# Patient Record
Sex: Male | Born: 1955 | Race: Black or African American | Hispanic: No | Marital: Single | Smoking: Current every day smoker
Health system: Southern US, Community
[De-identification: ages and names within clinical notes are randomized; demographics above are authoritative.]

## PROBLEM LIST (undated history)

## (undated) DIAGNOSIS — I10 Essential (primary) hypertension: Secondary | ICD-10-CM

## (undated) DIAGNOSIS — E785 Hyperlipidemia, unspecified: Secondary | ICD-10-CM

## (undated) DIAGNOSIS — F1010 Alcohol abuse, uncomplicated: Secondary | ICD-10-CM

## (undated) DIAGNOSIS — H547 Unspecified visual loss: Secondary | ICD-10-CM

## (undated) HISTORY — DX: Hyperlipidemia, unspecified: E78.5

---

## 1994-02-04 HISTORY — PX: TOOTH EXTRACTION: SUR596

## 2005-12-20 ENCOUNTER — Ambulatory Visit: Payer: Self-pay | Admitting: Family Medicine

## 2005-12-25 DIAGNOSIS — E785 Hyperlipidemia, unspecified: Secondary | ICD-10-CM | POA: Insufficient documentation

## 2005-12-25 DIAGNOSIS — M6530 Trigger finger, unspecified finger: Secondary | ICD-10-CM | POA: Insufficient documentation

## 2005-12-25 DIAGNOSIS — I10 Essential (primary) hypertension: Secondary | ICD-10-CM

## 2005-12-25 DIAGNOSIS — F17200 Nicotine dependence, unspecified, uncomplicated: Secondary | ICD-10-CM | POA: Insufficient documentation

## 2005-12-25 DIAGNOSIS — H548 Legal blindness, as defined in USA: Secondary | ICD-10-CM | POA: Insufficient documentation

## 2006-08-26 ENCOUNTER — Encounter (INDEPENDENT_AMBULATORY_CARE_PROVIDER_SITE_OTHER): Payer: Self-pay | Admitting: Family Medicine

## 2007-01-08 ENCOUNTER — Ambulatory Visit: Payer: Self-pay | Admitting: Family Medicine

## 2007-01-08 DIAGNOSIS — E669 Obesity, unspecified: Secondary | ICD-10-CM

## 2007-01-08 LAB — CONVERTED CEMR LAB
Cholesterol, target level: 200 mg/dL
LDL Goal: 130 mg/dL

## 2007-05-06 ENCOUNTER — Encounter (INDEPENDENT_AMBULATORY_CARE_PROVIDER_SITE_OTHER): Payer: Self-pay | Admitting: Family Medicine

## 2007-08-04 ENCOUNTER — Ambulatory Visit: Payer: Self-pay | Admitting: Family Medicine

## 2007-08-05 ENCOUNTER — Encounter (INDEPENDENT_AMBULATORY_CARE_PROVIDER_SITE_OTHER): Payer: Self-pay | Admitting: Family Medicine

## 2007-08-05 LAB — CONVERTED CEMR LAB
ALT: 35 units/L (ref 0–53)
AST: 33 units/L (ref 0–37)
Alkaline Phosphatase: 88 units/L (ref 39–117)
Basophils Absolute: 0.1 10*3/uL (ref 0.0–0.1)
Basophils Relative: 1 % (ref 0–1)
Chloride: 104 meq/L (ref 96–112)
Creatinine, Ser: 1.12 mg/dL (ref 0.40–1.50)
Eosinophils Absolute: 0.1 10*3/uL (ref 0.0–0.7)
Eosinophils Relative: 2 % (ref 0–5)
HDL: 54 mg/dL (ref >39)
Hemoglobin: 16.5 g/dL (ref 13.0–17.0)
LDL Cholesterol: 109 mg/dL — ABNORMAL HIGH (ref 0–99)
MCHC: 34.1 g/dL (ref 30.0–36.0)
MCV: 83.3 fL (ref 78.0–100.0)
Monocytes Absolute: 0.9 10*3/uL (ref 0.1–1.0)
Neutro Abs: 5.6 10*3/uL (ref 1.7–7.7)
PSA: 2.16 ng/mL (ref 0.10–4.00)
RDW: 17.1 % — ABNORMAL HIGH (ref 11.5–15.5)
TSH: 1.064 microintl units/mL (ref 0.350–5.50)
Total Bilirubin: 0.5 mg/dL (ref 0.3–1.2)
Total CHOL/HDL Ratio: 3.7
VLDL: 37 mg/dL (ref 0–40)

## 2007-08-06 ENCOUNTER — Telehealth (INDEPENDENT_AMBULATORY_CARE_PROVIDER_SITE_OTHER): Payer: Self-pay | Admitting: Family Medicine

## 2008-02-22 ENCOUNTER — Ambulatory Visit: Payer: Self-pay | Admitting: Family Medicine

## 2008-02-22 DIAGNOSIS — D759 Disease of blood and blood-forming organs, unspecified: Secondary | ICD-10-CM | POA: Insufficient documentation

## 2008-06-29 ENCOUNTER — Telehealth (INDEPENDENT_AMBULATORY_CARE_PROVIDER_SITE_OTHER): Payer: Self-pay | Admitting: *Deleted

## 2010-02-04 HISTORY — PX: ROTATOR CUFF REPAIR: SHX139

## 2010-10-19 ENCOUNTER — Other Ambulatory Visit: Payer: Self-pay | Admitting: Specialist

## 2010-10-19 ENCOUNTER — Ambulatory Visit (HOSPITAL_COMMUNITY)
Admission: RE | Admit: 2010-10-19 | Discharge: 2010-10-19 | Disposition: A | Discharge status: Home / Self Care | Payer: Worker's Compensation | Source: Ambulatory Visit | Attending: Specialist | Admitting: Specialist

## 2010-10-19 ENCOUNTER — Encounter (HOSPITAL_COMMUNITY): Payer: Worker's Compensation

## 2010-10-19 ENCOUNTER — Other Ambulatory Visit (HOSPITAL_COMMUNITY): Payer: Self-pay | Admitting: Specialist

## 2010-10-19 DIAGNOSIS — S43429A Sprain of unspecified rotator cuff capsule, initial encounter: Secondary | ICD-10-CM | POA: Insufficient documentation

## 2010-10-19 DIAGNOSIS — Z01818 Encounter for other preprocedural examination: Secondary | ICD-10-CM | POA: Insufficient documentation

## 2010-10-19 DIAGNOSIS — J3489 Other specified disorders of nose and nasal sinuses: Secondary | ICD-10-CM | POA: Insufficient documentation

## 2010-10-19 DIAGNOSIS — M75102 Unspecified rotator cuff tear or rupture of left shoulder, not specified as traumatic: Secondary | ICD-10-CM

## 2010-10-19 DIAGNOSIS — Z01812 Encounter for preprocedural laboratory examination: Secondary | ICD-10-CM | POA: Insufficient documentation

## 2010-10-19 DIAGNOSIS — R059 Cough, unspecified: Secondary | ICD-10-CM | POA: Insufficient documentation

## 2010-10-19 DIAGNOSIS — X58XXXA Exposure to other specified factors, initial encounter: Secondary | ICD-10-CM | POA: Insufficient documentation

## 2010-10-19 DIAGNOSIS — R05 Cough: Secondary | ICD-10-CM | POA: Insufficient documentation

## 2010-10-19 LAB — BASIC METABOLIC PANEL
Calcium: 10.4 mg/dL (ref 8.4–10.5)
GFR calc Af Amer: >60 mL/min (ref >60)
GFR calc non Af Amer: >60 mL/min (ref >60)
Potassium: 3.4 mEq/L — ABNORMAL LOW (ref 3.5–5.1)
Sodium: 138 mEq/L (ref 135–145)

## 2010-10-19 LAB — CBC
MCH: 27.7 pg (ref 26.0–34.0)
MCHC: 33.8 g/dL (ref 30.0–36.0)
RDW: 15.8 % — ABNORMAL HIGH (ref 11.5–15.5)

## 2010-10-19 LAB — SURGICAL PCR SCREEN: Staphylococcus aureus: NEGATIVE

## 2010-10-25 ENCOUNTER — Ambulatory Visit (HOSPITAL_COMMUNITY)
Admission: RE | Admit: 2010-10-25 | Discharge: 2010-10-26 | Disposition: A | Discharge status: Home / Self Care | Payer: Worker's Compensation | Source: Ambulatory Visit | Attending: Specialist | Admitting: Specialist

## 2010-10-25 DIAGNOSIS — F17200 Nicotine dependence, unspecified, uncomplicated: Secondary | ICD-10-CM | POA: Insufficient documentation

## 2010-10-25 DIAGNOSIS — Z01818 Encounter for other preprocedural examination: Secondary | ICD-10-CM | POA: Insufficient documentation

## 2010-10-25 DIAGNOSIS — M67919 Unspecified disorder of synovium and tendon, unspecified shoulder: Secondary | ICD-10-CM | POA: Insufficient documentation

## 2010-10-25 DIAGNOSIS — Z01812 Encounter for preprocedural laboratory examination: Secondary | ICD-10-CM | POA: Insufficient documentation

## 2010-10-25 DIAGNOSIS — H539 Unspecified visual disturbance: Secondary | ICD-10-CM | POA: Insufficient documentation

## 2010-10-25 DIAGNOSIS — M19019 Primary osteoarthritis, unspecified shoulder: Secondary | ICD-10-CM | POA: Insufficient documentation

## 2010-10-25 DIAGNOSIS — M719 Bursopathy, unspecified: Secondary | ICD-10-CM | POA: Insufficient documentation

## 2010-10-25 DIAGNOSIS — I10 Essential (primary) hypertension: Secondary | ICD-10-CM | POA: Insufficient documentation

## 2010-10-25 DIAGNOSIS — M25819 Other specified joint disorders, unspecified shoulder: Secondary | ICD-10-CM | POA: Insufficient documentation

## 2010-10-26 NOTE — Op Note (Signed)
[name], [name] NO.:  [id number]  MEDICAL RECORD NO.:  [id number]  LOCATION:  1620                         FACILITY:  [facility]  PHYSICIAN:  Jene Every, M.D.    DATE OF BIRTH:  01-05-1956  DATE OF PROCEDURE: DATE OF DISCHARGE:                              OPERATIVE REPORT   PREOPERATIVE DIAGNOSES:  Rotator cuff tear and acromioclavicular arthrosis, left shoulder; impingement syndrome.  POSTOPERATIVE DIAGNOSES:  Rotator cuff tear and acromioclavicular arthrosis, left shoulder; impingement syndrome.  PROCEDURE PERFORMED: 1. Mini open rotator cuff repair utilizing Mitek suture anchor. 2. Distal clavicle resection. 3. Subacromial decompression and acromioplasty.  ANESTHESIA:  General.  ASSISTANT:  Roma Schanz, P.A.  HISTORY:  55 year old with full-thickness rotator cuff tears, supraspinatus and symptomatic AC arthrosis, indicated for decompression, repair, and clavicle resection. It was fairly hypertrophic nonmobile distal clavicle with severe erosion of the acromiohumeral space and felt it was prudent to perform an open distal clavicle resection and felt repairing the rotator cuff appropriately with that approach as well. Risks and benefits discussed including infection, suboptimal range of motion, DVT, PE, anesthetic complications, etc.  TECHNIQUE:  The patient in supine beach-chair position after induction of adequate general anesthesia and 2 g Kefzol, left shoulder and upper extremity were prepped and draped in usual sterile fashion after range of motion was performed in the adequate range.  A small incision over the anterolateral aspect of the acromion was then made.  Subcutaneous tissue was dissected.  Electrocautery was utilized to achieve hemostasis.  The anterolateral corner was identified and divided the raphe, approximately 1.5 cm from the anterolateral aspect of the acromion.  I made a small incision in the raphe and  subperiosteally elevated the anterolateral and the anteromedial aspect of the deltoid preserving the attachment anteriorly releasing the CA ligament.  I removed a spur off the anterolateral aspect of the acromion with a 3 mm Kerrison.  We digitally lysed adhesions in the subacromial space. Hypertrophic bursa was noted and this was excised.  Full inspection of the rotator cuff revealed a full-thickness tear of the supraspinatus consistent with what obtained on the MRI, with hyperemia and tearing.  I made a small incision there completing near the vicinity of the tear, it was only 1.5 cm in length.  We debrided the edges, good bleeding tissue was longitudinal.  I decorticated the bone just beneath that with an AO elevator and irrigated it.  This was lateral to the bicipital groove, which was palpated.  This was in the supraspinatus and mid footprint of the supraspinatus.  I placed a Mitek suture anchor deep into the trough, tapped in and threaded the sutures through the tear of the supraspinatus, reapproximated and perform a good surgical knot.  No need for a double row technique.  Fully approximated the tendon, remaining of the tendon was unremarkable.  Following this and acromioplasty, we then turned our attention to the distal clavicle.  I then enlarged the incision slightly medial.  Due to the hypertrophic nature and mobility, I incised the capsule, which was hypertrophic and skeletonized the distal clavicle protecting the soft tissues anteriorly and posteriorly with a __________ was placed in the [facility] joint beneath the  clavicle to protect the rotator cuff.  Using an oscillating saw, removed 1 cm of the distal clavicle posteriorly with a fair amount of osteophytic ridging and the inferior osteophytes with good portion of that procedure decompressing the posterior aspect of the distal clavicle utilizing a 3 mm Kerrison undercutting the osteophytes, which were protected inferiorly.  It was  somewhat difficult to visualize; however, with meticulous approach and exposure, I removed this pathologic portion. Following this, we copiously irrigated the [facility] joint, inspected the capsular tear, tested the mobility and the stability of the distal clavicle, which was intact.  Coracoclavicular ligaments were intact. Next, copiously irrigated the wound.  We repaired the capsule with 0 Vicryl interrupted figure-of-eight sutures with watertight closure with double trapezial fashion with 2-0 Vicryl simple sutures and subcu with 2- 0 Vicryl simple sutures.  The raphe was repaired with #1 Vicryl interrupted figure-of-eight sutures overtop and through the acromion, copiously irrigated the wounds, subcu with 2-0 Vicryl simple sutures, and skin was reapproximated with 4-0 subcuticular Prolene.  Wound reinforced with Steri-Strips, sterile dressing applied, placed in a sling and abduction pillow, and extubated without difficulty and transported to the recovery room in satisfactory condition.  We used 4% Marcaine with epinephrine to infiltrate the subacromial region for a local block.  The patient tolerated the procedure well.  No complications.  Minimal blood loss.     Jene Every, M.D.     Cordelia Pen  D:  10/25/2010  T:  10/25/2010  Job:  [id number]  Electronically Signed by Jene Every M.D. on 10/26/2010 03:11:19 PM

## 2011-07-05 ENCOUNTER — Encounter (INDEPENDENT_AMBULATORY_CARE_PROVIDER_SITE_OTHER): Payer: Self-pay | Admitting: Ophthalmology

## 2011-07-12 ENCOUNTER — Encounter (INDEPENDENT_AMBULATORY_CARE_PROVIDER_SITE_OTHER): Payer: Self-pay | Admitting: Ophthalmology

## 2012-05-18 DIAGNOSIS — I10 Essential (primary) hypertension: Secondary | ICD-10-CM | POA: Diagnosis not present

## 2012-05-18 DIAGNOSIS — E785 Hyperlipidemia, unspecified: Secondary | ICD-10-CM | POA: Diagnosis not present

## 2012-05-25 ENCOUNTER — Other Ambulatory Visit (HOSPITAL_COMMUNITY): Payer: Self-pay | Admitting: Family Medicine

## 2012-05-25 DIAGNOSIS — E041 Nontoxic single thyroid nodule: Secondary | ICD-10-CM

## 2012-06-01 ENCOUNTER — Ambulatory Visit (HOSPITAL_COMMUNITY): Payer: Medicare Other

## 2012-07-06 ENCOUNTER — Ambulatory Visit (HOSPITAL_COMMUNITY)
Admission: RE | Admit: 2012-07-06 | Discharge: 2012-07-06 | Disposition: A | Discharge status: Home / Self Care | Payer: Medicare Other | Source: Ambulatory Visit | Attending: Family Medicine | Admitting: Family Medicine

## 2012-07-06 DIAGNOSIS — E049 Nontoxic goiter, unspecified: Secondary | ICD-10-CM | POA: Diagnosis not present

## 2012-07-06 DIAGNOSIS — E0789 Other specified disorders of thyroid: Secondary | ICD-10-CM | POA: Diagnosis not present

## 2012-07-06 DIAGNOSIS — E041 Nontoxic single thyroid nodule: Secondary | ICD-10-CM

## 2012-07-06 DIAGNOSIS — E042 Nontoxic multinodular goiter: Secondary | ICD-10-CM | POA: Diagnosis not present

## 2012-07-06 DIAGNOSIS — I10 Essential (primary) hypertension: Secondary | ICD-10-CM | POA: Diagnosis not present

## 2012-07-23 ENCOUNTER — Telehealth: Payer: Self-pay

## 2012-07-23 NOTE — Telephone Encounter (Signed)
Spoke to pt and he will call back to schedule colonoscopy.

## 2012-07-31 NOTE — Telephone Encounter (Signed)
LMOM to call.

## 2012-08-03 ENCOUNTER — Telehealth: Payer: Self-pay

## 2012-08-05 NOTE — Telephone Encounter (Signed)
Gastroenterology Pre-Procedure Form   Pt said he drinks a couple of beers maybe about every other week end   Request Date: 08/03/2012   , Requesting Physician: Dr. Mirna Mires     PATIENT INFORMATION:  [name] is a 57 y.o., male (DOB=02/12/1955).  PROCEDURE: Procedure(s) requested: colonoscopy Procedure Reason: screening for colon cancer  PATIENT REVIEW QUESTIONS: The patient reports the following:    1. Diabetes Melitis: no 2. Joint replacements in the past 12 months: no 3. Major health problems in the past 3 months: no 4. Has an artificial valve or MVP:no 5. Has been advised in past to take antibiotics in advance of a procedure like teeth cleaning: no}    MEDICATIONS & ALLERGIES:    Patient reports the following regarding taking any blood thinners:   Plavix? no Aspirin? YES Coumadin?  no  Patient confirms/reports the following medications:  Current Outpatient Prescriptions  Medication Sig Dispense Refill  . amLODipine (NORVASC) 10 MG tablet Take 10 mg by mouth daily.      Marland Kitchen aspirin 81 MG tablet Take 81 mg by mouth daily.      Marland Kitchen HYDROcodone-acetaminophen (NORCO/VICODIN) 5-325 MG per tablet Take 1 tablet by mouth every 6 (six) hours as needed for pain. Only as needed      . valsartan-hydrochlorothiazide (DIOVAN-HCT) 160-25 MG per tablet Take 1 tablet by mouth daily.       No current facility-administered medications for this visit.    Patient confirms/reports the following allergies:  Allergies  Allergen Reactions  . Cortisone     CORTISONE INJECTION MADE HIM FEEL LIKE SOMETHING WAS CRAWLING ALL OVER HIM.     Patient is appropriate to schedule for requested procedure(s): yes  AUTHORIZATION INFORMATION Primary Insurance: ID #: Group #:  Pre-Cert / Auth required: Pre-Cert / Auth #:   Secondary Insurance:   ID #:  Group #:  Pre-Cert / Auth required: Pre-Cert / Auth #:   No orders of the defined types were placed in this encounter.    SCHEDULE  INFORMATION: Procedure has been scheduled as follows:  Date:               Time:   Location:   This Gastroenterology Pre-Precedure Form is being routed to the following provider(s) for review: Jonette Eva, MD   Pt waiting for his schedule to make the appt.

## 2012-08-05 NOTE — Telephone Encounter (Signed)
MOVI PREP SPLIT DOSING, CLEAR LIQUIDS with breakfast.

## 2012-08-06 NOTE — Telephone Encounter (Signed)
Pt to call with a schedule.

## 2012-10-26 DIAGNOSIS — Z23 Encounter for immunization: Secondary | ICD-10-CM | POA: Diagnosis not present

## 2012-11-17 DIAGNOSIS — H3552 Pigmentary retinal dystrophy: Secondary | ICD-10-CM | POA: Diagnosis not present

## 2012-11-17 DIAGNOSIS — H2510 Age-related nuclear cataract, unspecified eye: Secondary | ICD-10-CM | POA: Diagnosis not present

## 2013-03-27 DIAGNOSIS — E041 Nontoxic single thyroid nodule: Secondary | ICD-10-CM | POA: Diagnosis not present

## 2013-03-27 DIAGNOSIS — I10 Essential (primary) hypertension: Secondary | ICD-10-CM | POA: Diagnosis not present

## 2013-03-27 DIAGNOSIS — E0789 Other specified disorders of thyroid: Secondary | ICD-10-CM | POA: Diagnosis not present

## 2013-11-22 DIAGNOSIS — Z23 Encounter for immunization: Secondary | ICD-10-CM | POA: Diagnosis not present

## 2013-12-04 DIAGNOSIS — H540 Blindness, both eyes: Secondary | ICD-10-CM | POA: Diagnosis not present

## 2013-12-04 DIAGNOSIS — Z125 Encounter for screening for malignant neoplasm of prostate: Secondary | ICD-10-CM | POA: Diagnosis not present

## 2013-12-04 DIAGNOSIS — I10 Essential (primary) hypertension: Secondary | ICD-10-CM | POA: Diagnosis not present

## 2013-12-04 DIAGNOSIS — R634 Abnormal weight loss: Secondary | ICD-10-CM | POA: Diagnosis not present

## 2013-12-04 DIAGNOSIS — E785 Hyperlipidemia, unspecified: Secondary | ICD-10-CM | POA: Diagnosis not present

## 2013-12-10 ENCOUNTER — Other Ambulatory Visit (HOSPITAL_COMMUNITY): Payer: Self-pay | Admitting: Family Medicine

## 2013-12-10 ENCOUNTER — Ambulatory Visit (HOSPITAL_COMMUNITY)
Admission: RE | Admit: 2013-12-10 | Discharge: 2013-12-10 | Disposition: A | Discharge status: Home / Self Care | Payer: Medicare Other | Source: Ambulatory Visit | Attending: Family Medicine | Admitting: Family Medicine

## 2013-12-10 DIAGNOSIS — Z87891 Personal history of nicotine dependence: Secondary | ICD-10-CM | POA: Diagnosis not present

## 2013-12-10 DIAGNOSIS — F17200 Nicotine dependence, unspecified, uncomplicated: Secondary | ICD-10-CM

## 2013-12-10 DIAGNOSIS — R918 Other nonspecific abnormal finding of lung field: Secondary | ICD-10-CM | POA: Diagnosis not present

## 2014-01-15 DIAGNOSIS — I10 Essential (primary) hypertension: Secondary | ICD-10-CM | POA: Diagnosis not present

## 2014-01-15 DIAGNOSIS — R634 Abnormal weight loss: Secondary | ICD-10-CM | POA: Diagnosis not present

## 2014-06-25 DIAGNOSIS — I10 Essential (primary) hypertension: Secondary | ICD-10-CM | POA: Diagnosis not present

## 2014-06-25 DIAGNOSIS — E785 Hyperlipidemia, unspecified: Secondary | ICD-10-CM | POA: Diagnosis not present

## 2014-07-05 DIAGNOSIS — H3552 Pigmentary retinal dystrophy: Secondary | ICD-10-CM | POA: Diagnosis not present

## 2014-08-18 DIAGNOSIS — H3552 Pigmentary retinal dystrophy: Secondary | ICD-10-CM | POA: Diagnosis not present

## 2014-08-18 DIAGNOSIS — H25811 Combined forms of age-related cataract, right eye: Secondary | ICD-10-CM | POA: Diagnosis not present

## 2014-08-18 DIAGNOSIS — H25813 Combined forms of age-related cataract, bilateral: Secondary | ICD-10-CM | POA: Diagnosis not present

## 2014-08-18 DIAGNOSIS — H5213 Myopia, bilateral: Secondary | ICD-10-CM | POA: Diagnosis not present

## 2014-09-20 NOTE — Patient Instructions (Addendum)
[name]  09/20/2014     @PREFPERIOPPHARMACY @   Your procedure is scheduled on 09/26/2014  Report to [facility] at 7:00 A.M.  Call this number if you have problems the morning of surgery:  [phone]   Remember:  Do not eat food or drink liquids after midnight.  Take these medicines the morning of surgery with A SIP OF WATER Amlodipine, Hydrocodone and Diovan   Do not wear jewelry, make-up or nail polish.  Do not wear lotions, powders, or perfumes.  You may wear deodorant.  Do not shave 48 hours prior to surgery.  Men may shave face and neck.  Do not bring valuables to the hospital.  [facility] is not responsible for any belongings or valuables.  Contacts, dentures or bridgework may not be worn into surgery.  Leave your suitcase in the car.  After surgery it may be brought to your room.  For patients admitted to the hospital, discharge time will be determined by your treatment team.  Patients discharged the day of surgery will not be allowed to drive home.   Please read over the following fact sheets that you were given. Anesthesia Post-op Instructions     PATIENT INSTRUCTIONS POST-ANESTHESIA  IMMEDIATELY FOLLOWING SURGERY:  Do not drive or operate machinery for the first twenty four hours after surgery.  Do not make any important decisions for twenty four hours after surgery or while taking narcotic pain medications or sedatives.  If you develop intractable nausea and vomiting or a severe headache please notify your doctor immediately.  FOLLOW-UP:  Please make an appointment with your surgeon as instructed. You do not need to follow up with anesthesia unless specifically instructed to do so.  WOUND CARE INSTRUCTIONS (if applicable):  Keep a dry clean dressing on the anesthesia/puncture wound site if there is drainage.  Once the wound has quit draining you may leave it open to air.  Generally you should leave the bandage intact for twenty four hours unless there is  drainage.  If the epidural site drains for more than 36-48 hours please call the anesthesia department.  QUESTIONS?:  Please feel free to call your physician or the hospital operator if you have any questions, and they will be happy to assist you.      Cataract Surgery  A cataract is a clouding of the lens of the eye. When a lens becomes cloudy, vision is reduced based on the degree and nature of the clouding. Surgery may be needed to improve vision. Surgery removes the cloudy lens and usually replaces it with a substitute lens (intraocular lens, IOL). LET YOUR EYE DOCTOR KNOW ABOUT:  Allergies to food or medicine.  Medicines taken including herbs, eye drops, over-the-counter medicines, and creams.  Use of steroids (by mouth or creams).  Previous problems with anesthetics or numbing medicine.  History of bleeding problems or blood clots.  Previous surgery.  Other health problems, including diabetes and kidney problems.  Possibility of pregnancy, if this applies. RISKS AND COMPLICATIONS  Infection.  Inflammation of the eyeball (endophthalmitis) that can spread to both eyes (sympathetic ophthalmia).  Poor wound healing.  If an IOL is inserted, it can later fall out of proper position. This is very uncommon.  Clouding of the part of your eye that holds an IOL in place. This is called an "after-cataract." These are uncommon but easily treated. BEFORE THE PROCEDURE  Do not eat or drink anything except small amounts of water for 8 to 12  before your surgery, or as directed by your caregiver.  Unless you are told otherwise, continue any eye drops you have been prescribed.  Talk to your primary caregiver about all other medicines that you take (both prescription and nonprescription). In some cases, you may need to stop or change medicines near the time of your surgery. This is most important if you are taking blood-thinning medicine.Do not stop medicines unless you are told to do  so.  Arrange for someone to drive you to and from the procedure.  Do not put contact lenses in either eye on the day of your surgery. PROCEDURE There is more than one method for safely removing a cataract. Your doctor can explain the differences and help determine which is best for you. Phacoemulsification surgery is the most common form of cataract surgery.  An injection is given behind the eye or eye drops are given to make this a painless procedure.  A small cut (incision) is made on the edge of the clear, dome-shaped surface that covers the front of the eye (cornea).  A tiny probe is painlessly inserted into the eye. This device gives off ultrasound waves that soften and break up the cloudy center of the lens. This makes it easier for the cloudy lens to be removed by suction.  An IOL may be implanted.  The normal lens of the eye is covered by a clear capsule. Part of that capsule is intentionally left in the eye to support the IOL.  Your surgeon may or may not use stitches to close the incision. There are other forms of cataract surgery that require a larger incision and stitches to close the eye. This approach is taken in cases where the doctor feels that the cataract cannot be easily removed using phacoemulsification. AFTER THE PROCEDURE  When an IOL is implanted, it does not need care. It becomes a permanent part of your eye and cannot be seen or felt.  Your doctor will schedule follow-up exams to check on your progress.  Review your other medicines with your doctor to see which can be resumed after surgery.  Use eye drops or take medicine as prescribed by your doctor. Document Released: 01/10/2011 Document Revised: 06/07/2013 Document Reviewed: 01/10/2011 [employer] Patient Information 2015 [place], [place]. This information is not intended to replace advice given to you by your health care provider. Make sure you discuss any questions you have with your health care provider.

## 2014-09-21 ENCOUNTER — Encounter (HOSPITAL_COMMUNITY)
Admission: RE | Admit: 2014-09-21 | Discharge: 2014-09-21 | Disposition: A | Discharge status: Home / Self Care | Payer: Medicare Other | Source: Ambulatory Visit | Attending: Ophthalmology | Admitting: Ophthalmology

## 2014-09-21 ENCOUNTER — Encounter (HOSPITAL_COMMUNITY): Payer: Self-pay

## 2014-09-21 ENCOUNTER — Other Ambulatory Visit: Payer: Self-pay

## 2014-09-21 DIAGNOSIS — Z01818 Encounter for other preprocedural examination: Secondary | ICD-10-CM | POA: Diagnosis not present

## 2014-09-21 DIAGNOSIS — H2511 Age-related nuclear cataract, right eye: Secondary | ICD-10-CM | POA: Insufficient documentation

## 2014-09-21 HISTORY — DX: Essential (primary) hypertension: I10

## 2014-09-21 LAB — BASIC METABOLIC PANEL
ANION GAP: 7 (ref 5–15)
BUN: 15 mg/dL (ref 6–20)
CHLORIDE: 103 mmol/L (ref 101–111)
CO2: 29 mmol/L (ref 22–32)
Calcium: 9.1 mg/dL (ref 8.9–10.3)
Creatinine, Ser: 1.19 mg/dL (ref 0.61–1.24)
GFR calc Af Amer: >60 mL/min (ref >60)
GFR calc non Af Amer: >60 mL/min (ref >60)
GLUCOSE: 102 mg/dL — AB (ref 65–99)
POTASSIUM: 3.7 mmol/L (ref 3.5–5.1)
Sodium: 139 mmol/L (ref 135–145)

## 2014-09-21 LAB — CBC
HEMATOCRIT: 43.9 % (ref 39.0–52.0)
HEMOGLOBIN: 14.7 g/dL (ref 13.0–17.0)
MCH: 28.3 pg (ref 26.0–34.0)
MCHC: 33.5 g/dL (ref 30.0–36.0)
MCV: 84.6 fL (ref 78.0–100.0)
Platelets: 483 10*3/uL — ABNORMAL HIGH (ref 150–400)
RBC: 5.19 MIL/uL (ref 4.22–5.81)
RDW: 15.5 % (ref 11.5–15.5)
WBC: 7.2 10*3/uL (ref 4.0–10.5)

## 2014-09-23 MED ORDER — PHENYLEPHRINE HCL 2.5 % OP SOLN
OPHTHALMIC | Status: AC
  Filled 2014-09-23: qty 15

## 2014-09-23 MED ORDER — LIDOCAINE HCL 3.5 % OP GEL
OPHTHALMIC | Status: AC
  Filled 2014-09-23: qty 1

## 2014-09-23 MED ORDER — LIDOCAINE HCL (PF) 1 % IJ SOLN
INTRAMUSCULAR | Status: AC
  Filled 2014-09-23: qty 2

## 2014-09-23 MED ORDER — TETRACAINE HCL 0.5 % OP SOLN
OPHTHALMIC | Status: AC
  Filled 2014-09-23: qty 2

## 2014-09-23 MED ORDER — NEOMYCIN-POLYMYXIN-DEXAMETH 3.5-10000-0.1 OP SUSP
OPHTHALMIC | Status: AC
  Filled 2014-09-23: qty 5

## 2014-09-23 MED ORDER — CYCLOPENTOLATE-PHENYLEPHRINE OP SOLN OPTIME - NO CHARGE
OPHTHALMIC | Status: AC
  Filled 2014-09-23: qty 2

## 2014-09-26 ENCOUNTER — Encounter (HOSPITAL_COMMUNITY): Payer: Self-pay | Admitting: *Deleted

## 2014-09-26 ENCOUNTER — Ambulatory Visit (HOSPITAL_COMMUNITY)
Admission: RE | Admit: 2014-09-26 | Discharge: 2014-09-26 | Disposition: A | Discharge status: Home / Self Care | Payer: Medicare Other | Source: Ambulatory Visit | Attending: Ophthalmology | Admitting: Ophthalmology

## 2014-09-26 ENCOUNTER — Ambulatory Visit (HOSPITAL_COMMUNITY): Payer: Medicare Other | Admitting: Anesthesiology

## 2014-09-26 ENCOUNTER — Encounter (HOSPITAL_COMMUNITY)
Admission: RE | Disposition: A | Discharge status: Home / Self Care | Payer: Self-pay | Source: Ambulatory Visit | Attending: Ophthalmology

## 2014-09-26 DIAGNOSIS — Z79899 Other long term (current) drug therapy: Secondary | ICD-10-CM | POA: Diagnosis not present

## 2014-09-26 DIAGNOSIS — H25811 Combined forms of age-related cataract, right eye: Secondary | ICD-10-CM | POA: Insufficient documentation

## 2014-09-26 DIAGNOSIS — F17200 Nicotine dependence, unspecified, uncomplicated: Secondary | ICD-10-CM | POA: Diagnosis not present

## 2014-09-26 DIAGNOSIS — H269 Unspecified cataract: Secondary | ICD-10-CM | POA: Diagnosis not present

## 2014-09-26 DIAGNOSIS — I10 Essential (primary) hypertension: Secondary | ICD-10-CM | POA: Diagnosis not present

## 2014-09-26 DIAGNOSIS — Z7982 Long term (current) use of aspirin: Secondary | ICD-10-CM | POA: Diagnosis not present

## 2014-09-26 DIAGNOSIS — H3552 Pigmentary retinal dystrophy: Secondary | ICD-10-CM | POA: Diagnosis not present

## 2014-09-26 HISTORY — PX: CATARACT EXTRACTION W/PHACO: SHX586

## 2014-09-26 SURGERY — PHACOEMULSIFICATION, CATARACT, WITH IOL INSERTION
Anesthesia: Monitor Anesthesia Care | Site: Eye | Laterality: Right

## 2014-09-26 MED ORDER — FENTANYL CITRATE (PF) 100 MCG/2ML IJ SOLN
25.0000 ug | INTRAMUSCULAR | Status: AC
  Administered 2014-09-26 (×2): 25 ug via INTRAVENOUS

## 2014-09-26 MED ORDER — PROVISC 10 MG/ML IO SOLN
INTRAOCULAR | Status: DC | PRN
  Administered 2014-09-26: 0.85 mL via INTRAOCULAR

## 2014-09-26 MED ORDER — LACTATED RINGERS IV SOLN
INTRAVENOUS | Status: DC
  Administered 2014-09-26: 1000 mL via INTRAVENOUS

## 2014-09-26 MED ORDER — MIDAZOLAM HCL 2 MG/2ML IJ SOLN
INTRAMUSCULAR | Status: AC
  Filled 2014-09-26: qty 2

## 2014-09-26 MED ORDER — LIDOCAINE HCL 3.5 % OP GEL
1.0000 "application " | Freq: Once | OPHTHALMIC | Status: AC
  Administered 2014-09-26: 1 via OPHTHALMIC

## 2014-09-26 MED ORDER — LIDOCAINE 3.5 % OP GEL OPTIME - NO CHARGE
OPHTHALMIC | Status: DC | PRN
  Administered 2014-09-26: 1 [drp] via OPHTHALMIC

## 2014-09-26 MED ORDER — NEOMYCIN-POLYMYXIN-DEXAMETH 3.5-10000-0.1 OP SUSP
OPHTHALMIC | Status: DC | PRN
  Administered 2014-09-26: 2 [drp] via OPHTHALMIC

## 2014-09-26 MED ORDER — EPINEPHRINE HCL 1 MG/ML IJ SOLN
INTRAMUSCULAR | Status: AC
  Filled 2014-09-26: qty 1

## 2014-09-26 MED ORDER — LIDOCAINE HCL (PF) 1 % IJ SOLN
INTRAOCULAR | Status: DC | PRN
  Administered 2014-09-26: .6 mL via OPHTHALMIC

## 2014-09-26 MED ORDER — BSS IO SOLN
INTRAOCULAR | Status: DC | PRN
  Administered 2014-09-26: 15 mL

## 2014-09-26 MED ORDER — PHENYLEPHRINE HCL 2.5 % OP SOLN
1.0000 [drp] | OPHTHALMIC | Status: AC
  Administered 2014-09-26 (×3): 1 [drp] via OPHTHALMIC

## 2014-09-26 MED ORDER — FENTANYL CITRATE (PF) 100 MCG/2ML IJ SOLN
INTRAMUSCULAR | Status: AC
  Filled 2014-09-26: qty 2

## 2014-09-26 MED ORDER — TETRACAINE HCL 0.5 % OP SOLN
1.0000 [drp] | OPHTHALMIC | Status: AC
  Administered 2014-09-26 (×3): 1 [drp] via OPHTHALMIC

## 2014-09-26 MED ORDER — BSS IO SOLN
INTRAOCULAR | Status: DC | PRN
  Administered 2014-09-26: 500 mL

## 2014-09-26 MED ORDER — CYCLOPENTOLATE-PHENYLEPHRINE 0.2-1 % OP SOLN
1.0000 [drp] | OPHTHALMIC | Status: AC
  Administered 2014-09-26 (×3): 1 [drp] via OPHTHALMIC

## 2014-09-26 MED ORDER — POVIDONE-IODINE 5 % OP SOLN
OPHTHALMIC | Status: DC | PRN
  Administered 2014-09-26: 1 via OPHTHALMIC

## 2014-09-26 MED ORDER — MIDAZOLAM HCL 2 MG/2ML IJ SOLN
1.0000 mg | INTRAMUSCULAR | Status: DC | PRN
  Administered 2014-09-26 (×2): 2 mg via INTRAVENOUS

## 2014-09-26 SURGICAL SUPPLY — 11 items
CLOTH BEACON ORANGE TIMEOUT ST (SAFETY) ×3 IMPLANT
EYE SHIELD UNIVERSAL CLEAR (GAUZE/BANDAGES/DRESSINGS) ×3 IMPLANT
GLOVE BIOGEL PI IND STRL 6.5 (GLOVE) ×1 IMPLANT
GLOVE BIOGEL PI INDICATOR 6.5 (GLOVE) ×2
GLOVE EXAM NITRILE MD LF STRL (GLOVE) ×3 IMPLANT
PAD ARMBOARD 7.5X6 YLW CONV (MISCELLANEOUS) ×3 IMPLANT
SIGHTPATH CAT PROC W REG LENS (Ophthalmic Related) ×3 IMPLANT
SYRINGE LUER LOK 1CC (MISCELLANEOUS) ×3 IMPLANT
TAPE SURG TRANSPORE 1 IN (GAUZE/BANDAGES/DRESSINGS) ×1 IMPLANT
TAPE SURGICAL TRANSPORE 1 IN (GAUZE/BANDAGES/DRESSINGS) ×2
WATER STERILE IRR 250ML POUR (IV SOLUTION) ×3 IMPLANT

## 2014-09-26 NOTE — Op Note (Signed)
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  Assistants: None  Anesthesia: Topical with MAC  Indications: Painless, progressive loss of vision with compromise of daily activities.  Surgery: Cataract Extraction with Intraocular lens Implant Right Eye  Discription: The patient had dilating drops and viscous lidocaine placed into the Right eye in the pre-op holding area. After transfer to the operating room, a time out was performed. The patient was then prepped and draped. Beginning with a 42 degree blade a paracentesis port was made at the surgeon's 2 o'clock position. The anterior chamber was then filled with 1% non-preserved lidocaine. This was followed by filling the anterior chamber with Provisc.  A 2.65mm keratome blade was used to make a clear corneal incision at the temporal limbus.  A bent cystatome needle was used to start a continuous tear capsulotomy. There was increased lens mobility noted. The capsulorhexis was completed with Utrata forceps. Hydrodissection was performed with balanced salt solution on a Fine canula. The lens nucleus was then removed using the phacoemulsification handpiece. Residual cortex was removed with the I&A handpiece. The anterior chamber and capsular bag were refilled with Provisc. A posterior chamber intraocular lens was placed into the capsular bag with it's injector. The implant was positioned with the Kuglan hook. The Provisc was then removed from the anterior chamber and capsular bag with the I&A handpiece. Stromal hydration of the main incision and paracentesis port was performed with BSS on a Fine canula. The wounds were tested for leak which was negative. The patient tolerated the procedure well. There were no operative complications. The patient was then transferred to the recovery room in stable  condition.  Complications: None  Specimen: None  EBL: None  Prosthetic device: Hoya iSert 250, power 16.0 D, SN [id number].

## 2014-09-26 NOTE — Discharge Instructions (Signed)

## 2014-09-26 NOTE — Anesthesia Postprocedure Evaluation (Signed)
  Anesthesia Post-op Note  Patient: [name]  Procedure(s) Performed: Procedure(s): CATARACT EXTRACTION PHACO AND INTRAOCULAR LENS PLACEMENT RIGHT EYE CDE=18.56 (Right)  Patient Location: Short Stay  Anesthesia Type:MAC  Level of Consciousness: awake, alert , oriented and patient cooperative  Airway and Oxygen Therapy: Patient Spontanous Breathing  Post-op Pain: none  Post-op Assessment: Post-op Vital signs reviewed, Patient's Cardiovascular Status Stable, Respiratory Function Stable, Patent Airway, No signs of Nausea or vomiting and Pain level controlled              Post-op Vital Signs: Reviewed and stable  Last Vitals:  Filed Vitals:   09/26/14 0830  BP: 103/70  Pulse:   Temp:   Resp: 13    Complications: No apparent anesthesia complications

## 2014-09-26 NOTE — Anesthesia Preprocedure Evaluation (Signed)
Anesthesia Evaluation  Patient identified by MRN, date of birth, ID band Patient awake    Reviewed: Allergy & Precautions, NPO status , Patient's Chart, lab work & pertinent test results  Airway Mallampati: II  TM Distance: >3 FB     Dental  (+) Poor Dentition, Missing   Pulmonary Current Smoker,  breath sounds clear to auscultation        Cardiovascular hypertension, Pt. on medications Rhythm:Regular Rate:Normal     Neuro/Psych    GI/Hepatic negative GI ROS,   Endo/Other    Renal/GU      Musculoskeletal   Abdominal   Peds  Hematology   Anesthesia Other Findings   Reproductive/Obstetrics                             Anesthesia Physical Anesthesia Plan  ASA: II  Anesthesia Plan: MAC   Post-op Pain Management:    Induction: Intravenous  Airway Management Planned: Nasal Cannula  Additional Equipment:   Intra-op Plan:   Post-operative Plan:   Informed Consent: I have reviewed the patients History and Physical, chart, labs and discussed the procedure including the risks, benefits and alternatives for the proposed anesthesia with the patient or authorized representative who has indicated his/her understanding and acceptance.     Plan Discussed with:   Anesthesia Plan Comments:         Anesthesia Quick Evaluation

## 2014-09-26 NOTE — H&P (Signed)
I have reviewed the H&P, the patient was re-examined, and I have identified no interval changes in medical condition and plan of care since the history and physical of record  

## 2014-09-26 NOTE — Transfer of Care (Signed)
Immediate Anesthesia Transfer of Care Note  Patient: [name]  Procedure(s) Performed: Procedure(s): CATARACT EXTRACTION PHACO AND INTRAOCULAR LENS PLACEMENT RIGHT EYE CDE=18.56 (Right)  Patient Location: Short Stay  Anesthesia Type:MAC  Level of Consciousness: awake, alert , oriented and patient cooperative  Airway & Oxygen Therapy: Patient Spontanous Breathing  Post-op Assessment: Report given to RN and Post -op Vital signs reviewed and stable  Post vital signs: Reviewed and stable  Last Vitals:  Filed Vitals:   09/26/14 0830  BP: 103/70  Pulse:   Temp:   Resp: 13    Complications: No apparent anesthesia complications

## 2014-09-26 NOTE — Anesthesia Procedure Notes (Signed)
Procedure Name: MAC Date/Time: 09/26/2014 8:34 AM Performed by: Andree Elk, AMY A Pre-anesthesia Checklist: Patient identified, Timeout performed, Emergency Drugs available, Suction available and Patient being monitored Oxygen Delivery Method: Nasal cannula

## 2014-09-27 ENCOUNTER — Encounter (HOSPITAL_COMMUNITY): Payer: Self-pay | Admitting: Ophthalmology

## 2014-11-08 DIAGNOSIS — E039 Hypothyroidism, unspecified: Secondary | ICD-10-CM | POA: Diagnosis not present

## 2014-11-08 DIAGNOSIS — I10 Essential (primary) hypertension: Secondary | ICD-10-CM | POA: Diagnosis not present

## 2014-11-08 DIAGNOSIS — N182 Chronic kidney disease, stage 2 (mild): Secondary | ICD-10-CM | POA: Diagnosis not present

## 2014-12-09 ENCOUNTER — Ambulatory Visit: Payer: Self-pay | Admitting: "Endocrinology

## 2015-01-19 ENCOUNTER — Encounter: Payer: Self-pay | Admitting: "Endocrinology

## 2015-01-19 ENCOUNTER — Ambulatory Visit (INDEPENDENT_AMBULATORY_CARE_PROVIDER_SITE_OTHER): Payer: Medicare Other | Admitting: "Endocrinology

## 2015-01-19 VITALS — BP 115/76 | HR 72 | Ht 69.0 in | Wt 179.0 lb

## 2015-01-19 DIAGNOSIS — E042 Nontoxic multinodular goiter: Secondary | ICD-10-CM

## 2015-01-20 LAB — THYROID PEROXIDASE ANTIBODY

## 2015-01-20 LAB — TSH: TSH: 0.877 u[IU]/mL (ref 0.350–4.500)

## 2015-01-20 LAB — THYROGLOBULIN ANTIBODY

## 2015-01-20 LAB — T4, FREE: FREE T4: 0.82 ng/dL (ref 0.80–1.80)

## 2015-01-20 NOTE — Progress Notes (Signed)
Subjective:    Patient ID: [name], male    DOB: 1955-08-29, PCP Maggie Font, MD   Past Medical History  Diagnosis Date  . Hypertension   . Hyperlipidemia    Past Surgical History  Procedure Laterality Date  . Rotator cuff repair Left 2012  . Tooth extraction N/A 1996  . Cataract extraction w/phaco Right 09/26/2014    Procedure: CATARACT EXTRACTION PHACO AND INTRAOCULAR LENS PLACEMENT RIGHT EYE CDE=18.56;  Surgeon: Tonny Branch, MD;  Location: [facility];  Service: Ophthalmology;  Laterality: Right;   Social History   Social History  . Marital Status: Single    Spouse Name: N/A  . Number of Children: N/A  . Years of Education: N/A   Social History Main Topics  . Smoking status: Current Every Day Smoker -- 1.00 packs/day for 20 years    Types: Cigarettes  . Smokeless tobacco: Current User  . Alcohol Use: 0.6 oz/week    1 Glasses of wine per week  . Drug Use: No  . Sexual Activity: Not Asked   Other Topics Concern  . None   Social History Narrative   Outpatient Encounter Prescriptions as of 01/19/2015  Medication Sig  . amLODipine (NORVASC) 10 MG tablet Take 10 mg by mouth daily.  Marland Kitchen aspirin 81 MG tablet Take 81 mg by mouth daily.  Marland Kitchen atorvastatin (LIPITOR) 10 MG tablet Take 10 mg by mouth daily.  . valsartan-hydrochlorothiazide (DIOVAN-HCT) 160-25 MG per tablet Take 1 tablet by mouth daily.  Marland Kitchen HYDROcodone-acetaminophen (NORCO/VICODIN) 5-325 MG per tablet Take 1 tablet by mouth every 6 (six) hours as needed for pain. Only as needed  . pravastatin (PRAVACHOL) 20 MG tablet Take 20 mg by mouth daily.   No facility-administered encounter medications on file as of 01/19/2015.   ALLERGIES: Allergies  Allergen Reactions  . Cortisone     CORTISONE INJECTION MADE HIM FEEL LIKE SOMETHING WAS CRAWLING ALL OVER HIM.    VACCINATION STATUS: Immunization History  Administered Date(s) Administered  . H1N1 02/22/2008  . Influenza Whole 12/20/2005, 01/08/2007     HPI  59 year old gentleman with medical history as above. She is being seen in consultation for history of multinodular goiter requested by Dr. Berdine Addison. He reports that he was diagnosed with goiter approximately 2 years ago. Denies any history of biopsy. He denies any complaints of dysphagia, shortness of breath, and waist change. He is a chronic smoker. He denies family history of thyroid cancer. He never required thyroid hormone replacement. 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 Review of his ultrasound from June 2014 showed significant enlargement of his thyroid with multiple nodules (see below) with a suggestion of fine needle aspiration. However no record of fine needle aspiration was found. This referral was triggered by the reported increase in the size of the goiter and recent months.  Review of Systems  Constitutional: he reports un quantified weight loss,  + fatigue, no subjective hyperthermia/hypothermia Eyes: He is legally blind on both eyes due to retinitis pigmentosa. ENT: no sore throat, no nodules palpated in throat, no dysphagia/odynophagia, no hoarseness Cardiovascular: no CP/SOB/palpitations/leg swelling Respiratory: no cough/SOB Gastrointestinal: no N/V/D/C Musculoskeletal: no muscle/joint aches Skin: no rashes Neurological: no tremors/numbness/tingling/dizziness Psychiatric: no depression/anxiety  Objective:    BP 115/76 mmHg  Pulse 72  Ht 5\' 9"  (1.753 m)  Wt 179 lb (81.194 kg)  BMI 26.42 kg/m2  SpO2 98%  Wt Readings from Last 3 Encounters:  01/19/15 179 lb (81.194 kg)  09/26/14  177 lb (80.287 kg)  02/22/08 215 lb (97.523 kg)    Physical Exam  Constitutional: overweight, in NAD Eyes:  Legally blind on both eyes due to retinitis pigmentosa.  ENT: moist mucous membranes, no thyromegaly, no cervical lymphadenopathy Cardiovascular: RRR, No MRG Respiratory: Normal breathing efforts., he has diffuse wheezes. Gastrointestinal: abdomen  soft, NT, ND, BS+ Musculoskeletal: no deformities, strength intact in all 4 Skin: moist, warm, no rashes Neurological: no tremor with outstretched hands, DTR normal in all 4  Results for orders placed or performed in visit on 01/19/15  Thyroid peroxidase antibody  Result Value Ref Range   Thyroperoxidase Ab SerPl-aCnc >900 (H) <9 IU/mL  Thyroglobulin antibody  Result Value Ref Range   Thyroglobulin Ab <1 <2 IU/mL  T4, free  Result Value Ref Range   Free T4 0.82 0.80 - 1.80 ng/dL  TSH  Result Value Ref Range   TSH 0.877 0.350 - 4.500 uIU/mL   Complete Blood Count (Most recent): Lab Results  Component Value Date   WBC 7.2 09/21/2014   HGB 14.7 09/21/2014   HCT 43.9 09/21/2014   MCV 84.6 09/21/2014   PLT 483* 09/21/2014   Chemistry (most recent): Lab Results  Component Value Date   NA 139 09/21/2014   K 3.7 09/21/2014   CL 103 09/21/2014   CO2 29 09/21/2014   BUN 15 09/21/2014   CREATININE 1.19 09/21/2014   Diabetic Labs (most recent): No results found for: HGBA1C Lipid profile (most recent): Lab Results  Component Value Date   TRIG 183* 08/05/2007   CHOL 200 08/05/2007   07/06/2012 thyroid ultrasound due to  Clinical  Thyromegaly.  Findings:  Right thyroid lobe: Measures 6.7 x 3.7 x 3.6 cm in size. Left thyroid lobe: Measures 5.7 x 3.1 x 2.1 cm in size. Isthmus: Measures 1 cm in thickness.  Focal nodules: Multiple solid nodules are noted bilaterally. The dominant nodule measures 4.6 x 3.4 x 2.0 cm involving the right side of the isthmus. Two smaller solid nodules are noted in the right thyroid lobe, one measuring 1.7 x 1.3 x 1.1 cm in the upper lobe and the other measuring 3.1 x 2.8 x 2.0 cm in the lower lobe. Two solid nodules are noted in the mid pole of the left thyroid lobe, one measuring 1.4 x 1.2 x 1.1 cm and the other measuring 1.9 x 1.4 x 1.3 cm.  Lymphadenopathy: None visualized.  IMPRESSION: Multiple bilateral solid thyroid nodules  are noted most consistent with multinodular goiter. However, the dominant nodule measures 4.6 cm in the right side of the isthmus and meets consensus criteria for biopsy. Therefore, ultrasound guided fine needle aspiration of this nodule is recommended.   Fine-needle aspiration was not performed.   Assessment & Plan:   1. Nontoxic multinodular goiter -Patient has well settled diagnosis of multinodular goiter. Clinically he is euthyroid,  and new thyroid function tests are within normal limits. He would not need thyroid hormone supplement.  He has strong TPO antibodies suggesting Hashimoto's thyroiditis.  I had a long discussion about natural course of thyroid nodules and the need to know the behavior of nodules greater than 1 cm in size. Accordingly, I approached him for  repeat of thyroid / neck ultrasound which will likely be followed by fine-needle aspiration of biggest nodules on both lobes of the thyroid. This is to rule out malignancy and a chronic multinodular goiter, especially given the reported history of recent increase in the size of the thyroid. He will return in  2 weeks with FNA results.   -He is counseled against smoking, however he is not ready to quit.  - I advised patient to maintain close follow up with Maggie Font, MD for primary care needs. Follow up plan: Return in about 2 weeks (around 02/02/2015) for MNG.  Glade Lloyd, MD Phone: [phone]  Fax: [phone]   01/20/2015, 11:41 AM

## 2015-01-25 ENCOUNTER — Ambulatory Visit (HOSPITAL_COMMUNITY)
Admission: RE | Admit: 2015-01-25 | Discharge: 2015-01-25 | Disposition: A | Discharge status: Home / Self Care | Payer: Medicare Other | Source: Ambulatory Visit | Attending: "Endocrinology | Admitting: "Endocrinology

## 2015-01-25 ENCOUNTER — Other Ambulatory Visit: Payer: Self-pay | Admitting: "Endocrinology

## 2015-01-25 ENCOUNTER — Other Ambulatory Visit (HOSPITAL_COMMUNITY): Payer: Self-pay

## 2015-01-25 DIAGNOSIS — E042 Nontoxic multinodular goiter: Secondary | ICD-10-CM

## 2015-01-25 DIAGNOSIS — E041 Nontoxic single thyroid nodule: Secondary | ICD-10-CM | POA: Diagnosis not present

## 2015-01-25 NOTE — Discharge Instructions (Signed)

## 2015-01-25 NOTE — Procedures (Signed)
PreOperative Dx: Multiple thyroid nodules Postoperative Dx: Multiple thyroid nodules Procedure:   US guided FNA of Isthmic and RT inferior pole thyroid nodules Radiologist:  Thornton Papas Anesthesia:  2 ml of 1% lidocaine Specimen:  FNA x 3 of each nodule  EBL:   < 1 ml Complications: None

## 2015-02-02 ENCOUNTER — Encounter: Payer: Self-pay | Admitting: "Endocrinology

## 2015-02-02 ENCOUNTER — Ambulatory Visit (INDEPENDENT_AMBULATORY_CARE_PROVIDER_SITE_OTHER): Payer: Medicare Other | Admitting: "Endocrinology

## 2015-02-02 VITALS — BP 139/82 | HR 95 | Ht 69.0 in | Wt 180.8 lb

## 2015-02-02 DIAGNOSIS — E042 Nontoxic multinodular goiter: Secondary | ICD-10-CM

## 2015-02-02 NOTE — Progress Notes (Signed)
Subjective:    Patient ID: [name], male    DOB: 06-Jan-1956, PCP Maggie Font, MD   Past Medical History  Diagnosis Date  . Hypertension   . Hyperlipidemia    Past Surgical History  Procedure Laterality Date  . Rotator cuff repair Left 2012  . Tooth extraction N/A 1996  . Cataract extraction w/phaco Right 09/26/2014    Procedure: CATARACT EXTRACTION PHACO AND INTRAOCULAR LENS PLACEMENT RIGHT EYE CDE=18.56;  Surgeon: Tonny Branch, MD;  Location: [facility];  Service: Ophthalmology;  Laterality: Right;   Social History   Social History  . Marital Status: Single    Spouse Name: N/A  . Number of Children: N/A  . Years of Education: N/A   Social History Main Topics  . Smoking status: Current Every Day Smoker -- 1.00 packs/day for 20 years    Types: Cigarettes  . Smokeless tobacco: Current User  . Alcohol Use: 0.6 oz/week    1 Glasses of wine per week  . Drug Use: No  . Sexual Activity: Not Asked   Other Topics Concern  . None   Social History Narrative   Outpatient Encounter Prescriptions as of 02/02/2015  Medication Sig  . amLODipine (NORVASC) 10 MG tablet Take 10 mg by mouth daily.  Marland Kitchen aspirin 81 MG tablet Take 81 mg by mouth daily.  Marland Kitchen atorvastatin (LIPITOR) 10 MG tablet Take 10 mg by mouth daily.  Marland Kitchen HYDROcodone-acetaminophen (NORCO/VICODIN) 5-325 MG per tablet Take 1 tablet by mouth every 6 (six) hours as needed for pain. Only as needed  . pravastatin (PRAVACHOL) 20 MG tablet Take 20 mg by mouth daily.  . valsartan-hydrochlorothiazide (DIOVAN-HCT) 160-25 MG per tablet Take 1 tablet by mouth daily.   No facility-administered encounter medications on file as of 02/02/2015.   ALLERGIES: Allergies  Allergen Reactions  . Cortisone     CORTISONE INJECTION MADE HIM FEEL LIKE SOMETHING WAS CRAWLING ALL OVER HIM.    VACCINATION STATUS: Immunization History  Administered Date(s) Administered  . H1N1 02/22/2008  . Influenza Whole 12/20/2005, 01/08/2007     HPI  59 year old gentleman with medical history as above. He is here to follow-up after fine-needle aspiration of 2 nodules in his thyroid.  He reports that he was diagnosed with goiter approximately 2 years ago. Denies any history of biopsy. He denies any complaints of dysphagia, shortness of breath, and waist change. He is a chronic smoker. He denies family history of thyroid cancer. He never required thyroid hormone replacement. 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 Review of his ultrasound from June 2014 showed significant enlargement of his thyroid with multiple nodules (see below) with a suggestion of fine needle aspiration.  -His fine-needle aspiration is reported as benign.  Review of Systems  Constitutional: he reports un quantified weight loss,  + fatigue, no subjective hyperthermia/hypothermia Eyes: He is legally blind on both eyes due to retinitis pigmentosa. ENT: no sore throat, no nodules palpated in throat, no dysphagia/odynophagia, no hoarseness Cardiovascular: no CP/SOB/palpitations/leg swelling Respiratory: no cough/SOB Gastrointestinal: no N/V/D/C Musculoskeletal: no muscle/joint aches Skin: no rashes Neurological: no tremors/numbness/tingling/dizziness Psychiatric: no depression/anxiety  Objective:    BP 139/82 mmHg  Pulse 95  Ht 5\' 9"  (1.753 m)  Wt 180 lb 12.8 oz (82.01 kg)  BMI 26.69 kg/m2  SpO2 97%  Wt Readings from Last 3 Encounters:  02/02/15 180 lb 12.8 oz (82.01 kg)  01/19/15 179 lb (81.194 kg)  09/26/14 177 lb (80.287 kg)    Physical  Exam  Constitutional: overweight, in NAD Eyes:  Legally blind on both eyes due to retinitis pigmentosa.  ENT: moist mucous membranes, + nodular thyromegaly, no cervical lymphadenopathy Cardiovascular: RRR, No MRG Respiratory: Normal breathing efforts., he has diffuse wheezes. Gastrointestinal: abdomen soft, NT, ND, BS+ Musculoskeletal: no deformities, strength intact in all 4 Skin: moist,  warm, no rashes Neurological: no tremor with outstretched hands, DTR normal in all 4  Results for orders placed or performed in visit on 01/19/15  Thyroid peroxidase antibody  Result Value Ref Range   Thyroperoxidase Ab SerPl-aCnc >900 (H) <9 IU/mL  Thyroglobulin antibody  Result Value Ref Range   Thyroglobulin Ab <1 <2 IU/mL  T4, free  Result Value Ref Range   Free T4 0.82 0.80 - 1.80 ng/dL  TSH  Result Value Ref Range   TSH 0.877 0.350 - 4.500 uIU/mL   07/06/2012 thyroid ultrasound due to  Clinical  Thyromegaly.  Findings:  Right thyroid lobe: Measures 6.7 x 3.7 x 3.6 cm in size. Left thyroid lobe: Measures 5.7 x 3.1 x 2.1 cm in size. Isthmus: Measures 1 cm in thickness.  Focal nodules: Multiple solid nodules are noted bilaterally. The dominant nodule measures 4.6 x 3.4 x 2.0 cm involving the right side of the isthmus. Two smaller solid nodules are noted in the right thyroid lobe, one measuring 1.7 x 1.3 x 1.1 cm in the upper lobe and the other measuring 3.1 x 2.8 x 2.0 cm in the lower lobe. Two solid nodules are noted in the mid pole of the left thyroid lobe, one measuring 1.4 x 1.2 x 1.1 cm and the other measuring 1.9 x 1.4 x 1.3 cm.  Lymphadenopathy: None visualized.  IMPRESSION: Multiple bilateral solid thyroid nodules are noted most consistent with multinodular goiter. However, the dominant nodule measures 4.6 cm in the right side of the isthmus and meets consensus criteria for biopsy. Therefore, ultrasound guided fine needle aspiration of this nodule is recommended.   Fine-needle aspiration: Benign findings.   Assessment & Plan:   1. Nontoxic multinodular goiter -He is clinically euthyroid.  Bilateral thyroid nodules are benign after FNA. He has evidence of Hashimoto's thyroiditis with significantly elevated TPO antibodies. He would not need intervention for now. He will need repeat thyroid function test in 1 year with clinical  examination.  -He is counseled against smoking, however he is not ready to quit.  - I advised patient to maintain close follow up with Maggie Font, MD for primary care needs. Follow up plan: Return in about 1 year (around 02/02/2016) for follow up with pre-visit labs.  Glade Lloyd, MD Phone: [phone]  Fax: [phone]   02/02/2015, 4:45 PM

## 2015-02-14 ENCOUNTER — Encounter: Payer: Self-pay | Admitting: "Endocrinology

## 2015-04-24 DIAGNOSIS — E010 Iodine-deficiency related diffuse (endemic) goiter: Secondary | ICD-10-CM | POA: Diagnosis not present

## 2015-04-24 DIAGNOSIS — I10 Essential (primary) hypertension: Secondary | ICD-10-CM | POA: Diagnosis not present

## 2015-04-24 DIAGNOSIS — E785 Hyperlipidemia, unspecified: Secondary | ICD-10-CM | POA: Diagnosis not present

## 2015-05-07 ENCOUNTER — Emergency Department (HOSPITAL_COMMUNITY): Payer: Medicare Other

## 2015-05-07 ENCOUNTER — Encounter (HOSPITAL_COMMUNITY): Payer: Self-pay | Admitting: Emergency Medicine

## 2015-05-07 ENCOUNTER — Emergency Department (HOSPITAL_COMMUNITY)
Admission: EM | Admit: 2015-05-07 | Discharge: 2015-05-07 | Disposition: A | Discharge status: Home / Self Care | Payer: Medicare Other | Attending: Emergency Medicine | Admitting: Emergency Medicine

## 2015-05-07 DIAGNOSIS — F17210 Nicotine dependence, cigarettes, uncomplicated: Secondary | ICD-10-CM | POA: Insufficient documentation

## 2015-05-07 DIAGNOSIS — Y929 Unspecified place or not applicable: Secondary | ICD-10-CM | POA: Insufficient documentation

## 2015-05-07 DIAGNOSIS — Z7982 Long term (current) use of aspirin: Secondary | ICD-10-CM | POA: Diagnosis not present

## 2015-05-07 DIAGNOSIS — S8391XA Sprain of unspecified site of right knee, initial encounter: Secondary | ICD-10-CM | POA: Diagnosis not present

## 2015-05-07 DIAGNOSIS — Z79899 Other long term (current) drug therapy: Secondary | ICD-10-CM | POA: Insufficient documentation

## 2015-05-07 DIAGNOSIS — S4992XA Unspecified injury of left shoulder and upper arm, initial encounter: Secondary | ICD-10-CM

## 2015-05-07 DIAGNOSIS — M25561 Pain in right knee: Secondary | ICD-10-CM | POA: Diagnosis not present

## 2015-05-07 DIAGNOSIS — M25512 Pain in left shoulder: Secondary | ICD-10-CM | POA: Diagnosis not present

## 2015-05-07 DIAGNOSIS — Y999 Unspecified external cause status: Secondary | ICD-10-CM | POA: Insufficient documentation

## 2015-05-07 DIAGNOSIS — E785 Hyperlipidemia, unspecified: Secondary | ICD-10-CM | POA: Insufficient documentation

## 2015-05-07 DIAGNOSIS — I10 Essential (primary) hypertension: Secondary | ICD-10-CM | POA: Diagnosis not present

## 2015-05-07 DIAGNOSIS — S8991XA Unspecified injury of right lower leg, initial encounter: Secondary | ICD-10-CM | POA: Insufficient documentation

## 2015-05-07 DIAGNOSIS — Y9301 Activity, walking, marching and hiking: Secondary | ICD-10-CM | POA: Diagnosis not present

## 2015-05-07 DIAGNOSIS — R319 Hematuria, unspecified: Secondary | ICD-10-CM | POA: Diagnosis not present

## 2015-05-07 DIAGNOSIS — W010XXA Fall on same level from slipping, tripping and stumbling without subsequent striking against object, initial encounter: Secondary | ICD-10-CM | POA: Diagnosis not present

## 2015-05-07 MED ORDER — OXYCODONE-ACETAMINOPHEN 5-325 MG PO TABS: 1.0000 | ORAL_TABLET | ORAL | Status: DC | PRN

## 2015-05-07 MED ORDER — OXYCODONE-ACETAMINOPHEN 5-325 MG PO TABS
1.0000 | ORAL_TABLET | Freq: Once | ORAL | Status: AC
  Administered 2015-05-07: 1 via ORAL

## 2015-05-07 MED ORDER — OXYCODONE-ACETAMINOPHEN 5-325 MG PO TABS
ORAL_TABLET | ORAL | Status: AC
  Administered 2015-05-07: 1 via ORAL
  Filled 2015-05-07: qty 1

## 2015-05-07 MED ORDER — NAPROXEN 500 MG PO TABS: 500.0000 mg | ORAL_TABLET | Freq: Two times a day (BID) | ORAL | Status: DC

## 2015-05-07 NOTE — ED Provider Notes (Signed)
History  By signing my name below, I, [name], attest that this documentation has been prepared under the direction and in the presence of Treston Coker, PA-C. Electronically Signed: Marlowe Sellers, ED Scribe. 05/07/2015. 12:13 PM  Chief Complaint  Patient presents with  . Shoulder Injury   The history is provided by the patient and medical records. No language interpreter was used.    HPI Comments:  [name] is a 60 y.o. male who presents to the Emergency Department complaining of a fall that occurred two days ago. He states he was walking outside and slipped on some wet grass, landing on his left shoulder and right knee. He has taken Motrin with minimal relief of the pain. Raising the LUE or trying to move the LUE increases the pain. Walking and bearing weight increase the right knee pain. He denies alleviating factors. He denies head trauma, LOC, neck pain, bruising, wounds, nausea, vomiting, numbness, tingling or weakness of the LUE, CP, rib pain, left elbow or left wrist pain. He reports left rotator cuff surgery approximately 5-6 years prior with current pain feeling similar.   Past Medical History  Diagnosis Date  . Hypertension   . Hyperlipidemia    Past Surgical History  Procedure Laterality Date  . Rotator cuff repair Left 2012  . Tooth extraction N/A 1996  . Cataract extraction w/phaco Right 09/26/2014    Procedure: CATARACT EXTRACTION PHACO AND INTRAOCULAR LENS PLACEMENT RIGHT EYE CDE=18.56;  Surgeon: Tonny Branch, MD;  Location: [facility];  Service: Ophthalmology;  Laterality: Right;   History reviewed. No pertinent family history. Social History  Substance Use Topics  . Smoking status: Current Every Day Smoker -- 1.00 packs/day for 20 years    Types: Cigarettes  . Smokeless tobacco: Current User  . Alcohol Use: 0.6 oz/week    1 Glasses of wine per week    Review of Systems  Constitutional: Negative for fever, chills and fatigue.  HENT: Negative for  sore throat and trouble swallowing.   Respiratory: Negative for cough, shortness of breath and wheezing.   Cardiovascular: Negative for chest pain and palpitations.  Gastrointestinal: Negative for nausea, vomiting, abdominal pain and blood in stool.  Genitourinary: Positive for hematuria. Negative for dysuria and flank pain.  Musculoskeletal: Positive for arthralgias (left shoulder and right knee pain). Negative for back pain and neck pain.  Skin: Negative for color change, rash and wound.  Neurological: Negative for dizziness, weakness and numbness.  Hematological: Does not bruise/bleed easily.    Allergies  Cortisone  Home Medications   Prior to Admission medications   Medication Sig Start Date End Date Taking? Authorizing Provider  amLODipine (NORVASC) 10 MG tablet Take 10 mg by mouth daily.   Yes Historical Provider, MD  aspirin 81 MG tablet Take 81 mg by mouth daily.   Yes Historical Provider, MD  atorvastatin (LIPITOR) 10 MG tablet Take 10 mg by mouth daily.   Yes Historical Provider, MD  valsartan-hydrochlorothiazide (DIOVAN-HCT) 160-25 MG per tablet Take 1 tablet by mouth daily.   Yes Historical Provider, MD   Triage Vitals: BP 125/79 mmHg  Pulse 88  Temp(Src) 98.6 F (37 C) (Oral)  Resp 18  Ht 5\' 10"  (1.778 m)  Wt 175 lb (79.379 kg)  BMI 25.11 kg/m2  SpO2 99% Physical Exam  Constitutional: He is oriented to person, place, and time. He appears well-developed and well-nourished.  HENT:  Head: Normocephalic and atraumatic.  Neck: Normal range of motion.  No cervical tenderness  Cardiovascular:  Normal rate and intact distal pulses.   Pulmonary/Chest: Effort normal. No respiratory distress. He exhibits no tenderness.  Musculoskeletal: Normal range of motion. He exhibits tenderness. He exhibits no edema.  Tenderness to anterior left shoulder without bony deformity. Pain with abduction. Pt unable to hold left arm in abduction. Grip strength  Strong and equal bilaterally,   Sensation intact Tenderness to anterior right knee. Has full ROM. No erythema or effusion. Sensations intact.   Neurological: He is alert and oriented to person, place, and time. He exhibits normal muscle tone. Coordination normal.  Skin: Skin is warm and dry.  Psychiatric: He has a normal mood and affect. His behavior is normal.  Nursing note and vitals reviewed.   ED Course  Procedures (including critical care time) DIAGNOSTIC STUDIES: Oxygen Saturation is 99% on RA, normal by my interpretation.   COORDINATION OF CARE: 11:41 AM- Will wait for imaging to result. Will order pain medication. Pt verbalizes understanding and agrees to plan.  Medications  oxyCODONE-acetaminophen (PERCOCET/ROXICET) 5-325 MG per tablet 1 tablet (not administered)    Labs Review Labs Reviewed - No data to display  Imaging Review Dg Shoulder Left  05/07/2015  CLINICAL DATA:  Acute left shoulder pain after fall. Initial encounter. EXAM: LEFT SHOULDER - 2+ VIEW COMPARISON:  None. FINDINGS: There is no evidence of fracture or dislocation. Joint spaces are intact. Surgical screw is seen in proximal left humerus. Soft tissues are unremarkable. IMPRESSION: No acute abnormality seen in the left shoulder. Electronically Signed   By: Marijo Conception, M.D.   On: 05/07/2015 11:42   Dg Knee Complete 4 Views Right  05/07/2015  CLINICAL DATA:  Acute right knee pain after fall. Initial encounter. EXAM: RIGHT KNEE - COMPLETE 4+ VIEW COMPARISON:  None. FINDINGS: There is no evidence of fracture, dislocation, or joint effusion. There is no evidence of arthropathy or other focal bone abnormality. Soft tissues are unremarkable. IMPRESSION: Normal right knee. Electronically Signed   By: Marijo Conception, M.D.   On: 05/07/2015 11:45   I have personally reviewed and evaluated these images and lab results as part of my medical decision-making.   EKG Interpretation None      MDM   Final diagnoses:  Shoulder injury, left, initial  encounter  Sprain of right knee, initial encounter     Discussed X-Ray findings with pt and exam along with hx of previous rotator cuff repair is concerning for repeat injury.  I informed of need of close orthopedics follow-up. He is NV intact, offered sling for support, but pt declines.  Pt agreed to follow up with Dr. Tonita Cong at [facility] since Dr. Maxie Better performed previous surgery.  I personally performed the services described in this documentation, which was scribed in my presence. The recorded information has been reviewed and is accurate.     Kem Parkinson, PA-C 05/08/15 2100  Davonna Belling, MD 05/09/15 [phone]

## 2015-05-07 NOTE — Discharge Instructions (Signed)
Knee Sprain A knee sprain is a tear in the strong bands of tissue that connect the bones (ligaments) of your knee. HOME CARE  Raise (elevate) your injured knee to lessen puffiness (swelling).  To ease pain and puffiness, put ice on the injured area.  Put ice in a plastic bag.  Place a towel between your skin and the bag.  Leave the ice on for 20 minutes, 2-3 times a day.  Only take medicine as told by your doctor.  Do not leave your knee unprotected until pain and stiffness go away (usually 4-6 weeks).  If you have a cast or splint, do not get it wet. If your doctor told you to not take it off, cover it with a plastic bag when you shower or bathe. Do not swim.  Your doctor may have you do exercises to prevent or limit permanent weakness and stiffness. GET HELP RIGHT AWAY IF:   Your cast or splint becomes damaged.  Your pain gets worse.  You have a lot of pain, puffiness, or numbness below the cast or splint. MAKE SURE YOU:   Understand these instructions.  Will watch your condition.  Will get help right away if you are not doing well or get worse.   This information is not intended to replace advice given to you by your health care provider. Make sure you discuss any questions you have with your health care provider.   Document Released: 01/09/2009 Document Revised: 01/26/2013 Document Reviewed: 09/29/2012 Elsevier Interactive Patient Education [employer].

## 2015-05-07 NOTE — ED Notes (Signed)
Pt denies, LOC or head injury.

## 2015-05-07 NOTE — ED Notes (Signed)
Pt complains of left shoulder pain after a fall on Friday. Pt has hx of left shoulder surgery. Pt has limited ROM.

## 2015-06-28 ENCOUNTER — Emergency Department (HOSPITAL_COMMUNITY): Payer: Medicare Other

## 2015-06-28 ENCOUNTER — Encounter (HOSPITAL_COMMUNITY): Payer: Self-pay | Admitting: *Deleted

## 2015-06-28 ENCOUNTER — Emergency Department (HOSPITAL_COMMUNITY)
Admission: EM | Admit: 2015-06-28 | Discharge: 2015-06-28 | Disposition: A | Discharge status: Home / Self Care | Payer: Medicare Other | Attending: Emergency Medicine | Admitting: Emergency Medicine

## 2015-06-28 DIAGNOSIS — S0990XA Unspecified injury of head, initial encounter: Secondary | ICD-10-CM | POA: Insufficient documentation

## 2015-06-28 DIAGNOSIS — Z7982 Long term (current) use of aspirin: Secondary | ICD-10-CM | POA: Insufficient documentation

## 2015-06-28 DIAGNOSIS — E785 Hyperlipidemia, unspecified: Secondary | ICD-10-CM | POA: Insufficient documentation

## 2015-06-28 DIAGNOSIS — S0101XA Laceration without foreign body of scalp, initial encounter: Secondary | ICD-10-CM | POA: Diagnosis not present

## 2015-06-28 DIAGNOSIS — F17210 Nicotine dependence, cigarettes, uncomplicated: Secondary | ICD-10-CM | POA: Diagnosis not present

## 2015-06-28 DIAGNOSIS — I10 Essential (primary) hypertension: Secondary | ICD-10-CM | POA: Diagnosis not present

## 2015-06-28 DIAGNOSIS — Z79899 Other long term (current) drug therapy: Secondary | ICD-10-CM | POA: Insufficient documentation

## 2015-06-28 DIAGNOSIS — S199XXA Unspecified injury of neck, initial encounter: Secondary | ICD-10-CM | POA: Diagnosis not present

## 2015-06-28 DIAGNOSIS — S0181XA Laceration without foreign body of other part of head, initial encounter: Secondary | ICD-10-CM | POA: Diagnosis not present

## 2015-06-28 DIAGNOSIS — W228XXA Striking against or struck by other objects, initial encounter: Secondary | ICD-10-CM | POA: Insufficient documentation

## 2015-06-28 DIAGNOSIS — Y939 Activity, unspecified: Secondary | ICD-10-CM | POA: Insufficient documentation

## 2015-06-28 DIAGNOSIS — Y92002 Bathroom of unspecified non-institutional (private) residence single-family (private) house as the place of occurrence of the external cause: Secondary | ICD-10-CM | POA: Diagnosis not present

## 2015-06-28 DIAGNOSIS — Y999 Unspecified external cause status: Secondary | ICD-10-CM | POA: Insufficient documentation

## 2015-06-28 LAB — CBC WITH DIFFERENTIAL/PLATELET
BASOS PCT: 1 %
Basophils Absolute: 0.1 10*3/uL (ref 0.0–0.1)
EOS PCT: 1 %
Eosinophils Absolute: 0.1 10*3/uL (ref 0.0–0.7)
HEMATOCRIT: 40.7 % (ref 39.0–52.0)
Hemoglobin: 14 g/dL (ref 13.0–17.0)
Lymphocytes Relative: 29 %
Lymphs Abs: 2.7 10*3/uL (ref 0.7–4.0)
MCH: 28.5 pg (ref 26.0–34.0)
MCHC: 34.4 g/dL (ref 30.0–36.0)
MCV: 82.9 fL (ref 78.0–100.0)
MONO ABS: 0.6 10*3/uL (ref 0.1–1.0)
MONOS PCT: 6 %
NEUTROS ABS: 5.7 10*3/uL (ref 1.7–7.7)
Neutrophils Relative %: 63 %
PLATELETS: 507 10*3/uL — AB (ref 150–400)
RBC: 4.91 MIL/uL (ref 4.22–5.81)
RDW: 15.6 % — AB (ref 11.5–15.5)
WBC: 9.1 10*3/uL (ref 4.0–10.5)

## 2015-06-28 LAB — COMPREHENSIVE METABOLIC PANEL
ALBUMIN: 4.3 g/dL (ref 3.5–5.0)
ALT: 18 U/L (ref 17–63)
ANION GAP: 10 (ref 5–15)
AST: 21 U/L (ref 15–41)
Alkaline Phosphatase: 84 U/L (ref 38–126)
BILIRUBIN TOTAL: 0.2 mg/dL — AB (ref 0.3–1.2)
BUN: 21 mg/dL — ABNORMAL HIGH (ref 6–20)
CALCIUM: 9.1 mg/dL (ref 8.9–10.3)
CHLORIDE: 100 mmol/L — AB (ref 101–111)
CO2: 26 mmol/L (ref 22–32)
Creatinine, Ser: 1.33 mg/dL — ABNORMAL HIGH (ref 0.61–1.24)
GFR calc Af Amer: >60 mL/min (ref >60)
GFR, EST NON AFRICAN AMERICAN: 57 mL/min — AB (ref >60)
Glucose, Bld: 98 mg/dL (ref 65–99)
POTASSIUM: 2.9 mmol/L — AB (ref 3.5–5.1)
Sodium: 136 mmol/L (ref 135–145)
TOTAL PROTEIN: 7.2 g/dL (ref 6.5–8.1)

## 2015-06-28 LAB — I-STAT TROPONIN, ED: Troponin i, poc: 0 ng/mL (ref 0.00–0.08)

## 2015-06-28 LAB — ETHANOL: ALCOHOL ETHYL (B): 215 mg/dL — AB (ref <5)

## 2015-06-28 LAB — CBG MONITORING, ED: GLUCOSE-CAPILLARY: 91 mg/dL (ref 65–99)

## 2015-06-28 MED ORDER — SODIUM CHLORIDE 0.9 % IV BOLUS (SEPSIS)
1000.0000 mL | Freq: Once | INTRAVENOUS | Status: AC
  Administered 2015-06-28: 1000 mL via INTRAVENOUS

## 2015-06-28 MED ORDER — POTASSIUM CHLORIDE CRYS ER 20 MEQ PO TBCR
40.0000 meq | EXTENDED_RELEASE_TABLET | Freq: Once | ORAL | Status: AC
  Administered 2015-06-28: 40 meq via ORAL
  Filled 2015-06-28: qty 2

## 2015-06-28 NOTE — ED Provider Notes (Signed)
CSN: [id number]     Arrival date & time 06/28/15  1905 History   First MD Initiated Contact with Patient 06/28/15 1937     Chief Complaint  Patient presents with  . Head Laceration     (Consider location/radiation/quality/duration/timing/severity/associated sxs/prior Treatment) Patient is a 60 y.o. male presenting with scalp laceration. The history is provided by the patient (Patient has been drinking alcohol fell and hit his head no loc).  Head Laceration This is a new problem. The current episode started 3 to 5 hours ago. The problem occurs every several days. The problem has not changed since onset.Pertinent negatives include no chest pain, no abdominal pain and no headaches. Nothing aggravates the symptoms.    Past Medical History  Diagnosis Date  . Hypertension   . Hyperlipidemia    Past Surgical History  Procedure Laterality Date  . Rotator cuff repair Left 2012  . Tooth extraction N/A 1996  . Cataract extraction w/phaco Right 09/26/2014    Procedure: CATARACT EXTRACTION PHACO AND INTRAOCULAR LENS PLACEMENT RIGHT EYE CDE=18.56;  Surgeon: Tonny Branch, MD;  Location: [facility];  Service: Ophthalmology;  Laterality: Right;   History reviewed. No pertinent family history. Social History  Substance Use Topics  . Smoking status: Current Every Day Smoker -- 1.00 packs/day for 20 years    Types: Cigarettes  . Smokeless tobacco: Current User  . Alcohol Use: 0.6 oz/week    1 Glasses of wine per week    Review of Systems  Constitutional: Negative for appetite change and fatigue.  HENT: Negative for congestion, ear discharge and sinus pressure.   Eyes: Negative for discharge.  Respiratory: Negative for cough.   Cardiovascular: Negative for chest pain.  Gastrointestinal: Negative for abdominal pain and diarrhea.  Genitourinary: Negative for frequency and hematuria.  Musculoskeletal: Negative for back pain.  Skin: Negative for rash.  Neurological: Negative for seizures and  headaches.  Psychiatric/Behavioral: Negative for hallucinations.      Allergies  Cortisone  Home Medications   Prior to Admission medications   Medication Sig Start Date End Date Taking? Authorizing Provider  amLODipine (NORVASC) 10 MG tablet Take 10 mg by mouth daily.   Yes Historical Provider, MD  aspirin 81 MG tablet Take 81 mg by mouth daily.   Yes Historical Provider, MD  atorvastatin (LIPITOR) 10 MG tablet Take 10 mg by mouth daily.   Yes Historical Provider, MD  naproxen (NAPROSYN) 500 MG tablet Take 1 tablet (500 mg total) by mouth 2 (two) times daily with a meal. 05/07/15  Yes Tammy Triplett, PA-C  oxyCODONE-acetaminophen (PERCOCET/ROXICET) 5-325 MG tablet Take 1 tablet by mouth every 4 (four) hours as needed. 05/07/15  Yes Tammy Triplett, PA-C  valsartan-hydrochlorothiazide (DIOVAN-HCT) 160-25 MG per tablet Take 1 tablet by mouth daily.   Yes Historical Provider, MD   BP 98/63 mmHg  Pulse 68  Temp(Src) 97.7 F (36.5 C) (Oral)  Resp 18  Ht 5\' 10"  (1.778 m)  Wt 174 lb 9.6 oz (79.198 kg)  BMI 25.05 kg/m2  SpO2 97% Physical Exam  Constitutional: He is oriented to person, place, and time. He appears well-developed.  HENT:  Head: Normocephalic.  3 cm laceration to occipital area  Eyes: Conjunctivae and EOM are normal. No scleral icterus.  Neck: Neck supple. No thyromegaly present.  Cardiovascular: Normal rate and regular rhythm.  Exam reveals no gallop and no friction rub.   No murmur heard. Pulmonary/Chest: No stridor. He has no wheezes. He has no rales. He exhibits no tenderness.  Abdominal: He exhibits no distension. There is no tenderness. There is no rebound.  Musculoskeletal: Normal range of motion. He exhibits no edema.  Lymphadenopathy:    He has no cervical adenopathy.  Neurological: He is oriented to person, place, and time. He exhibits normal muscle tone. Coordination normal.  Skin: No rash noted. No erythema.  Psychiatric: He has a normal mood and affect. His  behavior is normal.    ED Course  .Marland KitchenLaceration Repair Date/Time: 06/28/2015 9:19 PM Performed by: Milton Ferguson Authorized by: Milton Ferguson Comments: Patient has superficial 3 cm laceration to the occipital head. Area was cleaned thoroughly with Betadine. 5 staples were used to close laceration. Patient tolerated the procedure well   (including critical care time) Labs Review Labs Reviewed  CBC WITH DIFFERENTIAL/PLATELET - Abnormal; Notable for the following:    RDW 15.6 (*)    Platelets 507 (*)    All other components within normal limits  COMPREHENSIVE METABOLIC PANEL - Abnormal; Notable for the following:    Potassium 2.9 (*)    Chloride 100 (*)    BUN 21 (*)    Creatinine, Ser 1.33 (*)    Total Bilirubin 0.2 (*)    GFR calc non Af Amer 57 (*)    All other components within normal limits  ETHANOL - Abnormal; Notable for the following:    Alcohol, Ethyl (B) 215 (*)    All other components within normal limits  CBG MONITORING, ED  I-STAT TROPOININ, ED    Imaging Review Ct Head Wo Contrast  06/28/2015  CLINICAL DATA:  Loss of consciousness in the bathroom, falling and striking head with laceration. EXAM: CT HEAD WITHOUT CONTRAST TECHNIQUE: Contiguous axial images were obtained from the base of the skull through the vertex without intravenous contrast. COMPARISON:  None. FINDINGS: The brain has normal appearance without evidence of atrophy, old or acute infarction, mass lesion, hemorrhage, hydrocephalus or extra-axial collection. Posterior parietal scalp injury is seen. No underlying skull fracture. No fluid in the sinuses, middle ears or mastoids. IMPRESSION: No intracranial abnormality. No skull fracture. Posterior parietal scalp injury. Electronically Signed   By: Nelson Chimes M.D.   On: 06/28/2015 20:59   Ct Cervical Spine Wo Contrast  06/28/2015  CLINICAL DATA:  [place] in the bathroom due to loss of consciousness. Trauma to the back of the head. EXAM: CT CERVICAL SPINE  WITHOUT CONTRAST TECHNIQUE: Multidetector CT imaging of the cervical spine was performed without intravenous contrast. Multiplanar CT image reconstructions were also generated. COMPARISON:  None. FINDINGS: Alignment is normal. No fracture. No significant degenerative change. No bony stenosis of the canal or foramina. IMPRESSION: Negative cervical spine CT.  No traumatic finding. Electronically Signed   By: Nelson Chimes M.D.   On: 06/28/2015 21:00   I have personally reviewed and evaluated these images and lab results as part of my medical decision-making.   EKG Interpretation None      MDM   Final diagnoses:  Head injury, initial encounter    EtOH abuse. Head injury with laceration. Normal CT head and cervical spine. Patient will follow-up in a week to have staples removed    Milton Ferguson, MD 06/28/15 2120

## 2015-06-28 NOTE — ED Notes (Signed)
Pt given graham crackers and peanut butter on the way out per request.

## 2015-06-28 NOTE — ED Notes (Signed)
Pt placed in yellow socks, high fall risk bracelet on, side rails up, and call light within reach. Pt and visitor instructed at this time to press call light if pt needed to get up or had further needs. Pt states he uses walking stick in public, but not at home. Reports some vision remains.

## 2015-06-28 NOTE — Discharge Instructions (Signed)
Clean laceration twice a day with soap and water. Follow-up in one week with your family doctor to get the sutures out

## 2015-06-28 NOTE — ED Notes (Signed)
Pt states he was in bathroom and had a loss of consciousness and hit head; pt has a large laceration to back of head, bleeding noted

## 2015-06-28 NOTE — ED Notes (Signed)
Pt states he drank approx a gallon of liquor today, then reports "4 shots". Pt does not remember what he hit his head on, states blacking out.

## 2015-06-28 NOTE — ED Notes (Signed)
Pt attempting to take down side rail at this time. Pt made aware side rails need to remain up due to high fall risk. Pt's family member is sitting with pt at this time.

## 2015-07-15 ENCOUNTER — Encounter (HOSPITAL_COMMUNITY): Payer: Self-pay | Admitting: Emergency Medicine

## 2015-07-15 ENCOUNTER — Emergency Department (HOSPITAL_COMMUNITY)
Admission: EM | Admit: 2015-07-15 | Discharge: 2015-07-15 | Disposition: A | Discharge status: Home / Self Care | Payer: Medicare Other | Attending: Emergency Medicine | Admitting: Emergency Medicine

## 2015-07-15 DIAGNOSIS — Z4802 Encounter for removal of sutures: Secondary | ICD-10-CM | POA: Insufficient documentation

## 2015-07-15 DIAGNOSIS — Z791 Long term (current) use of non-steroidal anti-inflammatories (NSAID): Secondary | ICD-10-CM | POA: Insufficient documentation

## 2015-07-15 DIAGNOSIS — I10 Essential (primary) hypertension: Secondary | ICD-10-CM | POA: Diagnosis not present

## 2015-07-15 DIAGNOSIS — Z7982 Long term (current) use of aspirin: Secondary | ICD-10-CM | POA: Diagnosis not present

## 2015-07-15 DIAGNOSIS — F17210 Nicotine dependence, cigarettes, uncomplicated: Secondary | ICD-10-CM | POA: Insufficient documentation

## 2015-07-15 DIAGNOSIS — Z79899 Other long term (current) drug therapy: Secondary | ICD-10-CM | POA: Diagnosis not present

## 2015-07-15 DIAGNOSIS — E785 Hyperlipidemia, unspecified: Secondary | ICD-10-CM | POA: Insufficient documentation

## 2015-07-15 NOTE — Discharge Instructions (Signed)

## 2015-07-15 NOTE — ED Provider Notes (Signed)
CSN: [id number]     Arrival date & time 07/15/15  1329 History   First MD Initiated Contact with Patient 07/15/15 1337     Chief Complaint  Patient presents with  . Suture / Staple Removal     (Consider location/radiation/quality/duration/timing/severity/associated sxs/prior Treatment) Patient is a 60 y.o. male presenting with suture removal. The history is provided by the patient. No language interpreter was used.  Suture / Staple Removal This is a new problem. The current episode started 1 to 4 weeks ago. The problem occurs constantly. The problem has been rapidly improving. Pertinent negatives include no visual change or vomiting. Nothing aggravates the symptoms. He has tried nothing for the symptoms. The treatment provided mild relief.    Past Medical History  Diagnosis Date  . Hypertension   . Hyperlipidemia    Past Surgical History  Procedure Laterality Date  . Rotator cuff repair Left 2012  . Tooth extraction N/A 1996  . Cataract extraction w/phaco Right 09/26/2014    Procedure: CATARACT EXTRACTION PHACO AND INTRAOCULAR LENS PLACEMENT RIGHT EYE CDE=18.56;  Surgeon: Tonny Branch, MD;  Location: [facility];  Service: Ophthalmology;  Laterality: Right;   History reviewed. No pertinent family history. Social History  Substance Use Topics  . Smoking status: Current Every Day Smoker -- 1.00 packs/day for 20 years    Types: Cigarettes  . Smokeless tobacco: Current User  . Alcohol Use: 0.6 oz/week    1 Glasses of wine per week    Review of Systems  Gastrointestinal: Negative for vomiting.  All other systems reviewed and are negative.     Allergies  Cortisone  Home Medications   Prior to Admission medications   Medication Sig Start Date End Date Taking? Authorizing Provider  amLODipine (NORVASC) 10 MG tablet Take 10 mg by mouth daily.    Historical Provider, MD  aspirin 81 MG tablet Take 81 mg by mouth daily.    Historical Provider, MD  atorvastatin (LIPITOR) 10 MG tablet  Take 10 mg by mouth daily.    Historical Provider, MD  naproxen (NAPROSYN) 500 MG tablet Take 1 tablet (500 mg total) by mouth 2 (two) times daily with a meal. 05/07/15   Tammy Triplett, PA-C  oxyCODONE-acetaminophen (PERCOCET/ROXICET) 5-325 MG tablet Take 1 tablet by mouth every 4 (four) hours as needed. 05/07/15   Tammy Triplett, PA-C  valsartan-hydrochlorothiazide (DIOVAN-HCT) 160-25 MG per tablet Take 1 tablet by mouth daily.    Historical Provider, MD   BP 117/82 mmHg  Pulse 84  Temp(Src) 98.2 F (36.8 C)  Resp 14  Ht 5\' 10"  (1.778 m)  Wt 78.926 kg  BMI 24.97 kg/m2  SpO2 100% Physical Exam  Constitutional: He is oriented to person, place, and time. He appears well-developed and well-nourished.  HENT:  Head: Normocephalic.  Well healed laceration scalp,  5 staples in place  Eyes: EOM are normal.  Neck: Normal range of motion.  Pulmonary/Chest: Effort normal.  Abdominal: He exhibits no distension.  Musculoskeletal: Normal range of motion.  Neurological: He is alert and oriented to person, place, and time.  Psychiatric: He has a normal mood and affect.  Nursing note and vitals reviewed.   ED Course  Procedures (including critical care time) Labs Review Labs Reviewed - No data to display  Imaging Review No results found. I have personally reviewed and evaluated these images and lab results as part of my medical decision-making.   EKG Interpretation None      MDM   Final diagnoses:  Encounter  for staple removal    An After Visit Summary was printed and given to the patient.    Hollace Kinnier [place], PA-C 07/15/15 1443  Merrily Pew, MD 07/15/15 [phone]

## 2015-07-15 NOTE — ED Notes (Signed)
Here to get 5 staples removed.  Placed about 2 weeks ago.

## 2016-01-20 DIAGNOSIS — E041 Nontoxic single thyroid nodule: Secondary | ICD-10-CM | POA: Diagnosis not present

## 2016-01-20 DIAGNOSIS — H548 Legal blindness, as defined in USA: Secondary | ICD-10-CM | POA: Diagnosis not present

## 2016-01-20 DIAGNOSIS — Z125 Encounter for screening for malignant neoplasm of prostate: Secondary | ICD-10-CM | POA: Diagnosis not present

## 2016-01-20 DIAGNOSIS — I10 Essential (primary) hypertension: Secondary | ICD-10-CM | POA: Diagnosis not present

## 2016-02-01 ENCOUNTER — Ambulatory Visit: Payer: Medicare Other | Admitting: "Endocrinology

## 2016-07-20 DIAGNOSIS — E049 Nontoxic goiter, unspecified: Secondary | ICD-10-CM | POA: Diagnosis not present

## 2016-07-20 DIAGNOSIS — F10929 Alcohol use, unspecified with intoxication, unspecified: Secondary | ICD-10-CM | POA: Diagnosis not present

## 2016-07-20 DIAGNOSIS — I10 Essential (primary) hypertension: Secondary | ICD-10-CM | POA: Diagnosis not present

## 2016-07-20 DIAGNOSIS — H548 Legal blindness, as defined in USA: Secondary | ICD-10-CM | POA: Diagnosis not present

## 2016-08-03 DIAGNOSIS — Z125 Encounter for screening for malignant neoplasm of prostate: Secondary | ICD-10-CM | POA: Diagnosis not present

## 2016-08-03 DIAGNOSIS — E079 Disorder of thyroid, unspecified: Secondary | ICD-10-CM | POA: Diagnosis not present

## 2016-08-03 DIAGNOSIS — I10 Essential (primary) hypertension: Secondary | ICD-10-CM | POA: Diagnosis not present

## 2016-08-03 DIAGNOSIS — E782 Mixed hyperlipidemia: Secondary | ICD-10-CM | POA: Diagnosis not present

## 2016-08-27 DIAGNOSIS — H5210 Myopia, unspecified eye: Secondary | ICD-10-CM | POA: Diagnosis not present

## 2016-10-12 DIAGNOSIS — E785 Hyperlipidemia, unspecified: Secondary | ICD-10-CM | POA: Diagnosis not present

## 2016-10-12 DIAGNOSIS — E041 Nontoxic single thyroid nodule: Secondary | ICD-10-CM | POA: Diagnosis not present

## 2016-10-12 DIAGNOSIS — I10 Essential (primary) hypertension: Secondary | ICD-10-CM | POA: Diagnosis not present

## 2016-10-12 DIAGNOSIS — H548 Legal blindness, as defined in USA: Secondary | ICD-10-CM | POA: Diagnosis not present

## 2016-11-07 DIAGNOSIS — Z961 Presence of intraocular lens: Secondary | ICD-10-CM | POA: Diagnosis not present

## 2016-11-07 DIAGNOSIS — H540X55 Blindness right eye category 5, blindness left eye category 5: Secondary | ICD-10-CM | POA: Diagnosis not present

## 2016-11-07 DIAGNOSIS — H2512 Age-related nuclear cataract, left eye: Secondary | ICD-10-CM | POA: Diagnosis not present

## 2016-11-07 DIAGNOSIS — H3552 Pigmentary retinal dystrophy: Secondary | ICD-10-CM | POA: Diagnosis not present

## 2016-11-07 DIAGNOSIS — H31093 Other chorioretinal scars, bilateral: Secondary | ICD-10-CM | POA: Diagnosis not present

## 2016-12-22 IMAGING — CT CT CERVICAL SPINE W/O CM
3 of 5 series · 9 of 33 positions shown, 11 images · non-contrast
Comparison: None.

CLINICAL DATA: Fell in the bathroom due to loss of consciousness.
Trauma to the back of the head.

EXAM:
CT CERVICAL SPINE WITHOUT CONTRAST
TECHNIQUE: Multidetector CT imaging of the cervical spine was performed without
intravenous contrast. Multiplanar CT image reconstructions were also
generated.

[Series 6: sagittal bone · sagittal · 0.20mm/px · 5 of 67 slices shown, 6 images]
[im 23/67  bone]
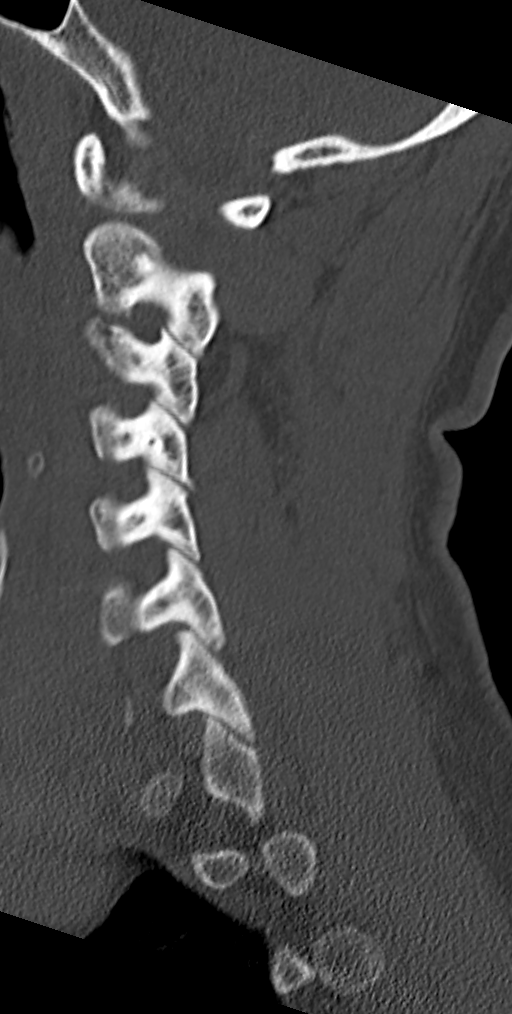
[im 28/67  bone]
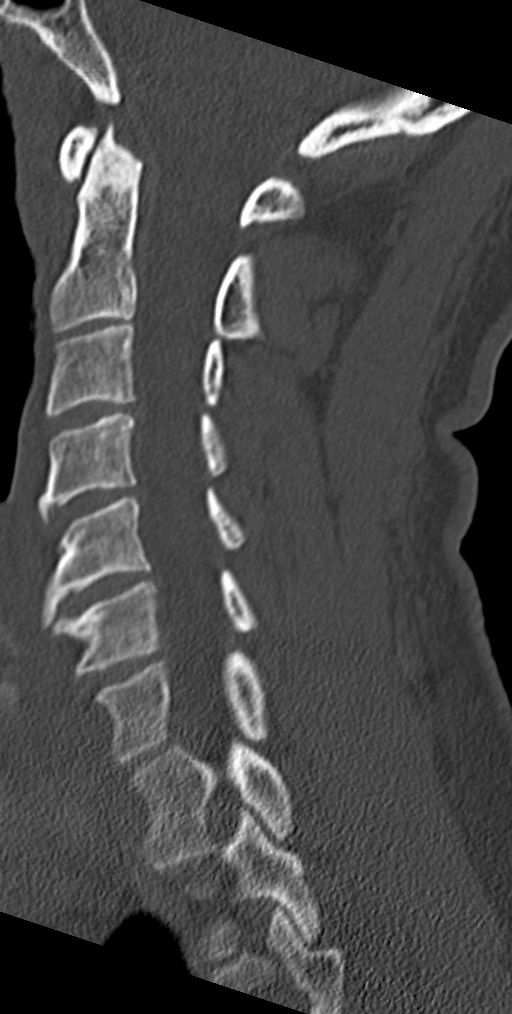
[im 34/67  soft-tissue]
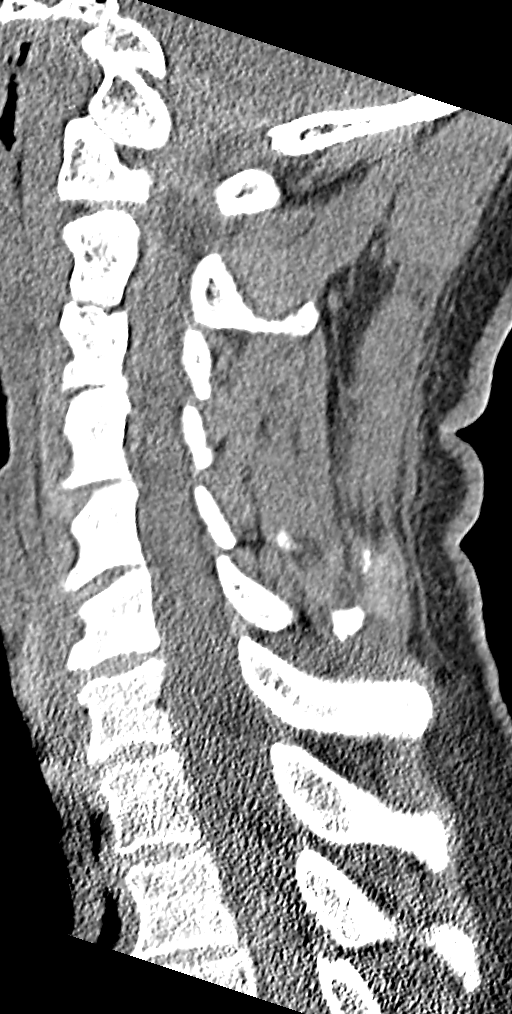
[im 34/67  bone]
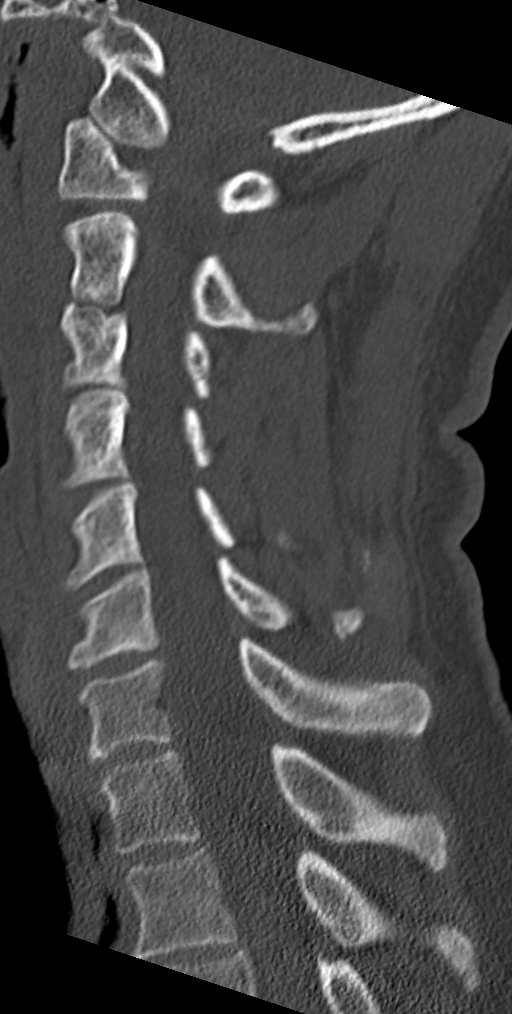
[im 39/67  bone]
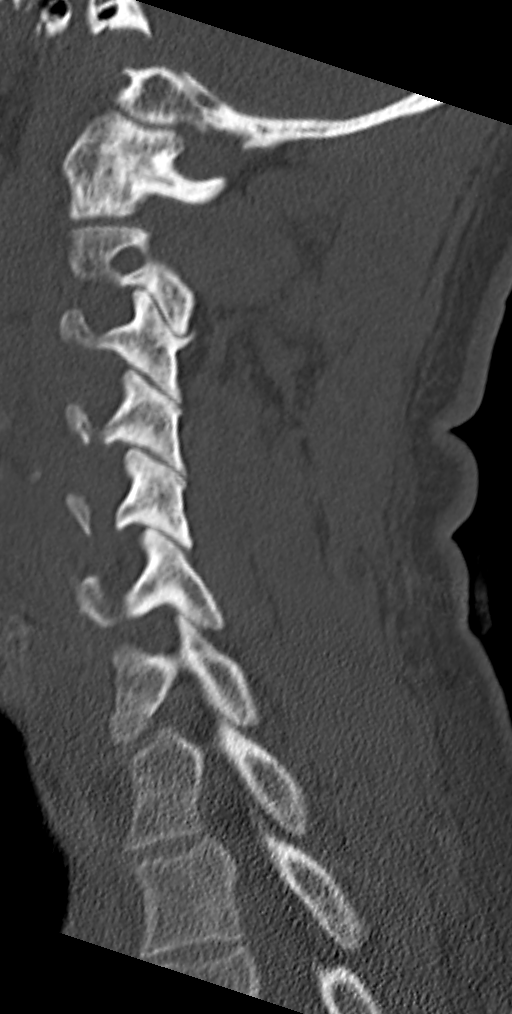
[im 45/67  bone]
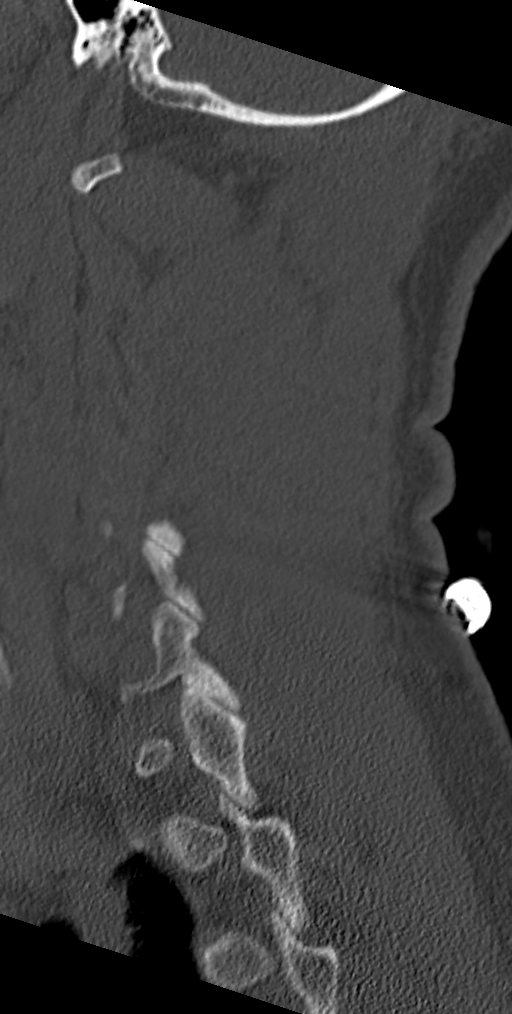

[Series 7: coronal bone · coronal · 0.22mm/px · 3 of 48 slices shown]
[im 10/48  bone]
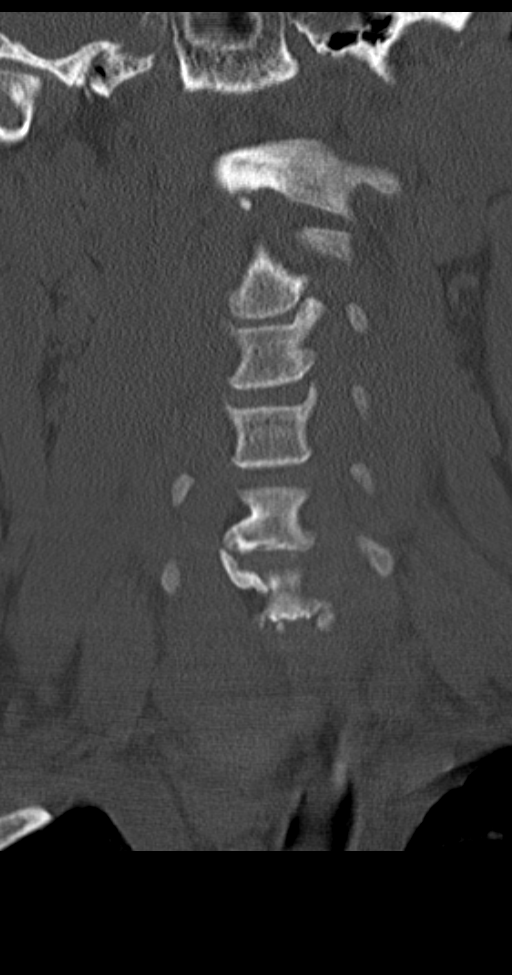
[im 19/48  bone]
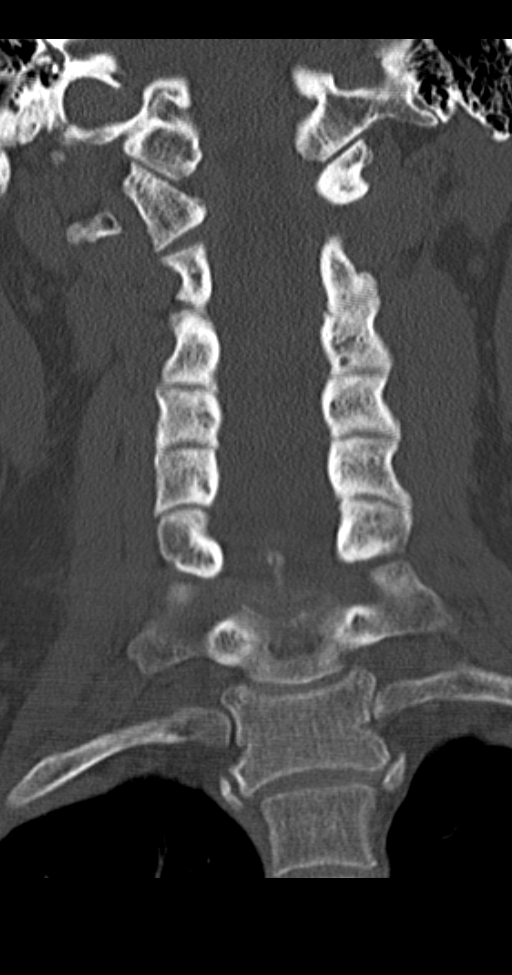
[im 29/48  bone]
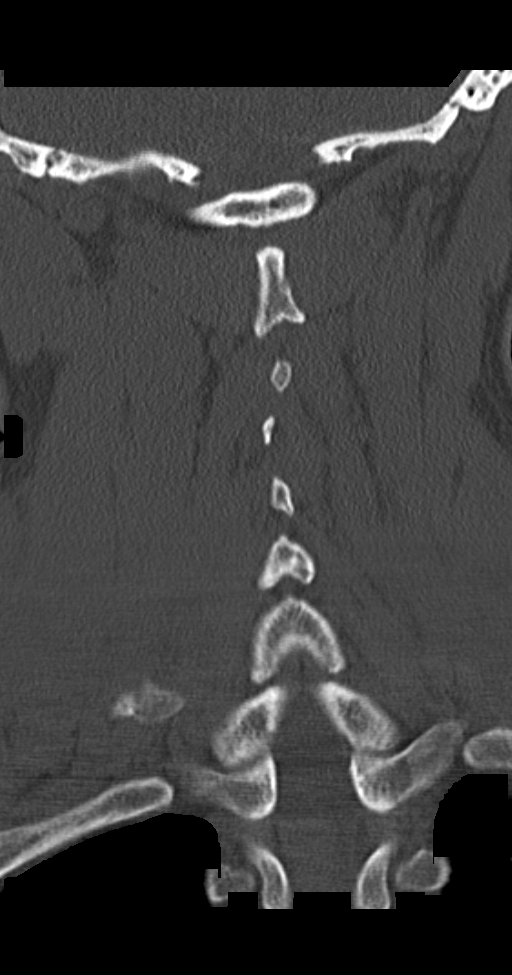

[Series 8: orthogonal axial · axial · 0.20mm/px · z∈[+86,+86]mm · 1 of 88 slices shown, 2 images]
[im 53/88  soft-tissue]
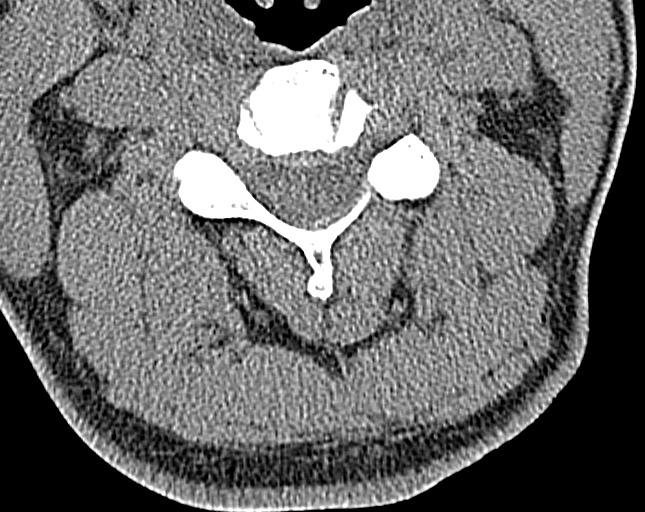
[im 53/88  bone]
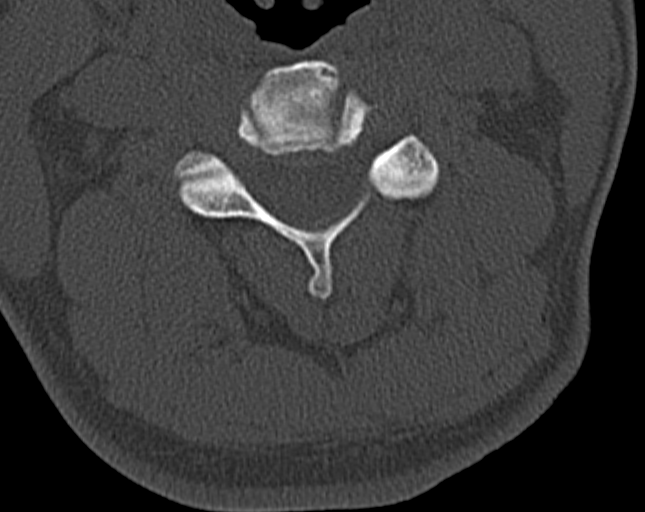

[9 of 33 positions shown; findings below may reference images not displayed]

FINDINGS: Alignment is normal. No fracture. No significant degenerative
change. No bony stenosis of the canal or foramina.
IMPRESSION: Negative cervical spine CT.  No traumatic finding.

## 2017-05-07 ENCOUNTER — Emergency Department (HOSPITAL_COMMUNITY): Payer: Medicare HMO

## 2017-05-07 ENCOUNTER — Observation Stay (HOSPITAL_COMMUNITY)
Admission: EM | Admit: 2017-05-07 | Discharge: 2017-05-08 | Disposition: A | Discharge status: Home / Self Care | Payer: Medicare HMO | Attending: Internal Medicine | Admitting: Internal Medicine

## 2017-05-07 ENCOUNTER — Encounter (HOSPITAL_COMMUNITY): Payer: Self-pay | Admitting: Emergency Medicine

## 2017-05-07 ENCOUNTER — Other Ambulatory Visit: Payer: Self-pay

## 2017-05-07 DIAGNOSIS — E876 Hypokalemia: Secondary | ICD-10-CM

## 2017-05-07 DIAGNOSIS — F17200 Nicotine dependence, unspecified, uncomplicated: Secondary | ICD-10-CM | POA: Diagnosis present

## 2017-05-07 DIAGNOSIS — D519 Vitamin B12 deficiency anemia, unspecified: Secondary | ICD-10-CM | POA: Diagnosis not present

## 2017-05-07 DIAGNOSIS — I10 Essential (primary) hypertension: Secondary | ICD-10-CM | POA: Diagnosis not present

## 2017-05-07 DIAGNOSIS — R200 Anesthesia of skin: Secondary | ICD-10-CM | POA: Diagnosis not present

## 2017-05-07 DIAGNOSIS — E785 Hyperlipidemia, unspecified: Secondary | ICD-10-CM | POA: Diagnosis not present

## 2017-05-07 DIAGNOSIS — F17210 Nicotine dependence, cigarettes, uncomplicated: Secondary | ICD-10-CM | POA: Diagnosis not present

## 2017-05-07 DIAGNOSIS — H548 Legal blindness, as defined in USA: Secondary | ICD-10-CM | POA: Diagnosis not present

## 2017-05-07 DIAGNOSIS — F1010 Alcohol abuse, uncomplicated: Secondary | ICD-10-CM | POA: Diagnosis not present

## 2017-05-07 DIAGNOSIS — F10129 Alcohol abuse with intoxication, unspecified: Secondary | ICD-10-CM | POA: Diagnosis not present

## 2017-05-07 DIAGNOSIS — I639 Cerebral infarction, unspecified: Secondary | ICD-10-CM

## 2017-05-07 LAB — COMPREHENSIVE METABOLIC PANEL
ALT: 33 U/L (ref 17–63)
AST: 61 U/L — AB (ref 15–41)
Albumin: 4.6 g/dL (ref 3.5–5.0)
Alkaline Phosphatase: 79 U/L (ref 38–126)
Anion gap: 14 (ref 5–15)
BUN: 21 mg/dL — AB (ref 6–20)
CHLORIDE: 102 mmol/L (ref 101–111)
CO2: 24 mmol/L (ref 22–32)
CREATININE: 1.32 mg/dL — AB (ref 0.61–1.24)
Calcium: 9.4 mg/dL (ref 8.9–10.3)
GFR calc Af Amer: >60 mL/min (ref >60)
GFR, EST NON AFRICAN AMERICAN: 56 mL/min — AB (ref >60)
GLUCOSE: 111 mg/dL — AB (ref 65–99)
Potassium: 2.8 mmol/L — ABNORMAL LOW (ref 3.5–5.1)
Sodium: 140 mmol/L (ref 135–145)
Total Bilirubin: 0.6 mg/dL (ref 0.3–1.2)
Total Protein: 7.8 g/dL (ref 6.5–8.1)

## 2017-05-07 LAB — DIFFERENTIAL
BASOS ABS: 0.1 10*3/uL (ref 0.0–0.1)
BASOS PCT: 1 %
Eosinophils Absolute: 0.1 10*3/uL (ref 0.0–0.7)
Eosinophils Relative: 1 %
Lymphocytes Relative: 28 %
Lymphs Abs: 3.2 10*3/uL (ref 0.7–4.0)
MONOS PCT: 8 %
Monocytes Absolute: 1 10*3/uL (ref 0.1–1.0)
NEUTROS ABS: 7.2 10*3/uL (ref 1.7–7.7)
Neutrophils Relative %: 62 %

## 2017-05-07 LAB — ETHANOL: Alcohol, Ethyl (B): 330 mg/dL (ref <10)

## 2017-05-07 LAB — CBC
HEMATOCRIT: 42.1 % (ref 39.0–52.0)
HEMOGLOBIN: 14.2 g/dL (ref 13.0–17.0)
MCH: 28.3 pg (ref 26.0–34.0)
MCHC: 33.7 g/dL (ref 30.0–36.0)
MCV: 84 fL (ref 78.0–100.0)
Platelets: 519 10*3/uL — ABNORMAL HIGH (ref 150–400)
RBC: 5.01 MIL/uL (ref 4.22–5.81)
RDW: 17.5 % — ABNORMAL HIGH (ref 11.5–15.5)
WBC: 11.6 10*3/uL — ABNORMAL HIGH (ref 4.0–10.5)

## 2017-05-07 LAB — I-STAT TROPONIN, ED: TROPONIN I, POC: 0.01 ng/mL (ref 0.00–0.08)

## 2017-05-07 LAB — PROTIME-INR
INR: 0.98
Prothrombin Time: 12.9 seconds (ref 11.4–15.2)

## 2017-05-07 LAB — APTT: APTT: 29 s (ref 24–36)

## 2017-05-07 MED ORDER — LORAZEPAM 2 MG/ML IJ SOLN: 1.0000 mg | Freq: Four times a day (QID) | INTRAMUSCULAR | Status: DC | PRN

## 2017-05-07 MED ORDER — ACETAMINOPHEN 160 MG/5ML PO SOLN: 650.0000 mg | ORAL | Status: DC | PRN

## 2017-05-07 MED ORDER — ATORVASTATIN CALCIUM 10 MG PO TABS
10.0000 mg | ORAL_TABLET | Freq: Every day | ORAL | Status: DC
  Administered 2017-05-08: 10 mg via ORAL
  Filled 2017-05-07: qty 1

## 2017-05-07 MED ORDER — ADULT MULTIVITAMIN W/MINERALS CH
1.0000 | ORAL_TABLET | Freq: Every day | ORAL | Status: DC
  Administered 2017-05-08: 1 via ORAL
  Filled 2017-05-07: qty 1

## 2017-05-07 MED ORDER — VITAMIN B-1 100 MG PO TABS
100.0000 mg | ORAL_TABLET | Freq: Every day | ORAL | Status: DC
  Administered 2017-05-08: 100 mg via ORAL
  Filled 2017-05-07: qty 1

## 2017-05-07 MED ORDER — SODIUM CHLORIDE 0.9 % IV SOLN
INTRAVENOUS | Status: DC
  Administered 2017-05-08: via INTRAVENOUS

## 2017-05-07 MED ORDER — ACETAMINOPHEN 650 MG RE SUPP: 650.0000 mg | RECTAL | Status: DC | PRN

## 2017-05-07 MED ORDER — ENOXAPARIN SODIUM 40 MG/0.4ML ~~LOC~~ SOLN
40.0000 mg | SUBCUTANEOUS | Status: DC
  Administered 2017-05-08: 40 mg via SUBCUTANEOUS
  Filled 2017-05-07: qty 0.4

## 2017-05-07 MED ORDER — STROKE: EARLY STAGES OF RECOVERY BOOK
Freq: Once | Status: AC
  Administered 2017-05-08: 01:00:00
  Filled 2017-05-07: qty 1

## 2017-05-07 MED ORDER — ACETAMINOPHEN 325 MG PO TABS: 650.0000 mg | ORAL_TABLET | ORAL | Status: DC | PRN

## 2017-05-07 MED ORDER — THIAMINE HCL 100 MG/ML IJ SOLN: 100.0000 mg | Freq: Every day | INTRAMUSCULAR | Status: DC

## 2017-05-07 MED ORDER — OXYCODONE-ACETAMINOPHEN 5-325 MG PO TABS
1.0000 | ORAL_TABLET | Freq: Four times a day (QID) | ORAL | Status: DC | PRN
Start: 2017-05-07 — End: 2017-05-08

## 2017-05-07 MED ORDER — POTASSIUM CHLORIDE 10 MEQ/100ML IV SOLN
10.0000 meq | INTRAVENOUS | Status: AC
  Administered 2017-05-08 (×3): 10 meq via INTRAVENOUS
  Filled 2017-05-07 (×3): qty 100

## 2017-05-07 MED ORDER — FOLIC ACID 1 MG PO TABS
1.0000 mg | ORAL_TABLET | Freq: Every day | ORAL | Status: DC
  Administered 2017-05-08: 1 mg via ORAL
  Filled 2017-05-07: qty 1

## 2017-05-07 MED ORDER — LORAZEPAM 1 MG PO TABS: 1.0000 mg | ORAL_TABLET | Freq: Four times a day (QID) | ORAL | Status: DC | PRN

## 2017-05-07 MED ORDER — ASPIRIN EC 81 MG PO TBEC
81.0000 mg | DELAYED_RELEASE_TABLET | Freq: Every day | ORAL | Status: DC
  Administered 2017-05-08: 81 mg via ORAL
  Filled 2017-05-07: qty 1

## 2017-05-07 MED ORDER — SENNOSIDES-DOCUSATE SODIUM 8.6-50 MG PO TABS: 1.0000 | ORAL_TABLET | Freq: Every evening | ORAL | Status: DC | PRN

## 2017-05-07 NOTE — Discharge Instructions (Signed)
You were seen in the ED today with leg numbness. We advised that you stay for additional testing to rule ou stroke but you are electing to leave against medical advice. You are welcome to return to this or any other Emergency Department at any time for treatment. Call [phone] if you develop any worsening numbness or weakness.   Call the Neurologist listed tomorrow to make a follow up appointment ASAP.

## 2017-05-07 NOTE — ED Notes (Signed)
Date and time results received: 05/07/17 2210 (use smartphrase ".now" to insert current time)  Test: Etoh Critical Value: 330  Name of Provider Notified: [place]  Orders Received? Or Actions Taken?:

## 2017-05-07 NOTE — ED Notes (Signed)
Date and time results received: 05/07/17 2212   Test: etoh Critical Value: 330  Name of Provider Notified: [place]  Orders Received? Or Actions Taken?: n/a

## 2017-05-07 NOTE — ED Notes (Signed)
More snacks and refreshments provided. Pt and family updated on POC.

## 2017-05-07 NOTE — ED Notes (Addendum)
IV placed in Right Antecubital site. EKG completed.  Pt is in room eating peanutbutter crackers without distress.  2 RN to pt room to complete tasks due to pt making vulgar and inappropriate comments towards women/staff.

## 2017-05-07 NOTE — ED Notes (Signed)
Snacks and refreshments given to pt.

## 2017-05-07 NOTE — H&P (Addendum)
[facility] H&P    Patient Demographics:    [name], is a 62 y.o. male  MRN: [id number]  DOB - [birth year]  Admit Date - 05/07/2017  Referring MD/NP/PA: Dr. Kathrynn Humble  Outpatient Primary MD for the patient is Jerry Beard, MD  Patient coming from: Home  Chief complaint-leg numbness   HPI:    [name]  is a 62 y.o. male, with history of hypertension, hyperlipidemia, blindness came to ED with complaints of right leg numbness.  Patient says symptoms started yesterday.  Denies any weakness of leg.  Also has a history of occasional numbness/tingling of hands, was prescribed gabapentin by PCP which did not help.  Patient denies difficulty with speech or swallowing.  No previous history of stroke.  No complains of back pain.  Patient is able to walk without any difficulty. He denies chest pain or shortness of breath. Denies nausea vomiting or diarrhea. Denies dysuria, urgency or frequency of urination. No history of CAD No history of previous stroke or seizures  In the ED, alcohol level was found to be 330 Potassium 2.8, creatinine 1.32 Patient was evaluated by tele neurology, and stroke evaluation was recommended. CT head was negative in the ED.    Review of systems:    In addition to the HPI above,    All other systems reviewed and are negative.   With Past History of the following :    Past Medical History:  Diagnosis Date  . Hyperlipidemia   . Hypertension       Past Surgical History:  Procedure Laterality Date  . CATARACT EXTRACTION W/PHACO Right 09/26/2014   Procedure: CATARACT EXTRACTION PHACO AND INTRAOCULAR LENS PLACEMENT RIGHT EYE CDE=18.56;  Surgeon: Tonny Branch, MD;  Location: [facility];  Service: Ophthalmology;  Laterality: Right;  . ROTATOR CUFF REPAIR Left 2012  . TOOTH EXTRACTION N/A 1996      Social History:      Social History   Tobacco Use  . Smoking status: Current  Every Day Smoker    Packs/day: 1.00    Years: 20.00    Pack years: 20.00    Types: Cigarettes  . Smokeless tobacco: Current User  Substance Use Topics  . Alcohol use: Yes    Alcohol/week: 0.6 oz    Types: 1 Glasses of wine per week       Family History :   Patient sister has breast cancer, mother had stroke   Home Medications:   Prior to Admission medications   Medication Sig Start Date End Date Taking? Authorizing Provider  amLODipine (NORVASC) 10 MG tablet Take 10 mg by mouth daily.   Yes [provider]  aspirin 81 MG tablet Take 81 mg by mouth daily.   Yes [provider]  valsartan-hydrochlorothiazide (DIOVAN-HCT) 160-25 MG per tablet Take 1 tablet by mouth daily.   Yes [provider]  atorvastatin (LIPITOR) 10 MG tablet Take 10 mg by mouth daily.    [provider]  naproxen (NAPROSYN) 500 MG tablet Take 1 tablet (500 mg total) by mouth 2 (  two) times daily with a meal. 05/07/15   Triplett, Tammy, PA-C  oxyCODONE-acetaminophen (PERCOCET/ROXICET) 5-325 MG tablet Take 1 tablet by mouth every 4 (four) hours as needed. 05/07/15   Triplett, Tammy, PA-C     Allergies:     Allergies  Allergen Reactions  . Cortisone     CORTISONE INJECTION MADE HIM FEEL LIKE SOMETHING WAS CRAWLING ALL OVER HIM.      Physical Exam:   Vitals  Blood pressure 124/66, pulse 76, temperature 97.9 F (36.6 C), temperature source Oral, resp. rate 17, height 5\' 10"  (1.778 m), weight 81.6 kg (180 lb), SpO2 100 %.  1.  General: Appears in no acute distress  2. Psychiatric:  Intact judgement and  insight, awake alert, oriented x 3.  3. Neurologic: No focal neurological deficits, all cranial nerves intact.Strength 5/5 all 4 extremities, sensation mildly reduced in right lower extremity, plantars down going.  4. Eyes :  anicteric sclerae, moist conjunctivae  5. ENMT:  Oropharynx clear with moist mucous membranes and good dentition  6. Neck:  supple, no  cervical lymphadenopathy appriciated, No thyromegaly  7. Respiratory : Normal respiratory effort, good air movement bilaterally,clear to  auscultation bilaterally  8. Cardiovascular : RRR, no gallops, rubs or murmurs, no leg edema  9. Gastrointestinal:  Positive bowel sounds, abdomen soft, non-tender to palpation,no hepatosplenomegaly, no rigidity or guarding       10. Skin:  No cyanosis, normal texture and turgor, no rash, lesions or ulcers  11.Musculoskeletal:  Good muscle tone,  joints appear normal , no effusions,  normal range of motion    Data Review:    CBC Recent Labs  Lab 05/07/17 2021  WBC 11.6*  HGB 14.2  HCT 42.1  PLT 519*  MCV 84.0  MCH 28.3  MCHC 33.7  RDW 17.5*  LYMPHSABS 3.2  MONOABS 1.0  EOSABS 0.1  BASOSABS 0.1   ------------------------------------------------------------------------------------------------------------------  Chemistries  Recent Labs  Lab 05/07/17 2021  NA 140  K 2.8*  CL 102  CO2 24  GLUCOSE 111*  BUN 21*  CREATININE 1.32*  CALCIUM 9.4  AST 61*  ALT 33  ALKPHOS 79  BILITOT 0.6   ------------------------------------------------------------------------------------------------------------------  ------------------------------------------------------------------------------------------------------------------ GFR: Estimated Creatinine Clearance: 59.9 mL/min (A) (by C-G formula based on SCr of 1.32 mg/dL (H)). Liver Function Tests: Recent Labs  Lab 05/07/17 2021  AST 61*  ALT 33  ALKPHOS 79  BILITOT 0.6  PROT 7.8  ALBUMIN 4.6   No results for input(s): LIPASE, AMYLASE in the last 168 hours. No results for input(s): AMMONIA in the last 168 hours. Coagulation Profile: Recent Labs  Lab 05/07/17 2021  INR 0.98   Cardiac Enzymes: No results for input(s): CKTOTAL, CKMB, CKMBINDEX, TROPONINI in the last 168 hours. BNP (last 3 results) No results for input(s): PROBNP in the last 8760 hours. HbA1C: No results  for input(s): HGBA1C in the last 72 hours. CBG: No results for input(s): GLUCAP in the last 168 hours. Lipid Profile: No results for input(s): CHOL, HDL, LDLCALC, TRIG, CHOLHDL, LDLDIRECT in the last 72 hours. Thyroid Function Tests: No results for input(s): TSH, T4TOTAL, FREET4, T3FREE, THYROIDAB in the last 72 hours. Anemia Panel: No results for input(s): VITAMINB12, FOLATE, FERRITIN, TIBC, IRON, RETICCTPCT in the last 72 hours.  --------------------------------------------------------------------------------------------------------------- Urine analysis: No results found for: COLORURINE, APPEARANCEUR, LABSPEC, PHURINE, GLUCOSEU, HGBUR, BILIRUBINUR, KETONESUR, PROTEINUR, UROBILINOGEN, NITRITE, LEUKOCYTESUR    Imaging Results:    Ct Head Wo Contrast  Result Date: 05/07/2017 CLINICAL DATA:  Right leg  numbness. EXAM: CT HEAD WITHOUT CONTRAST TECHNIQUE: Contiguous axial images were obtained from the base of the skull through the vertex without intravenous contrast. COMPARISON:  CT scan of Jun 28, 2015. FINDINGS: Brain: No evidence of acute infarction, hemorrhage, hydrocephalus, extra-axial collection or mass lesion/mass effect. Vascular: No hyperdense vessel or unexpected calcification. Skull: Normal. Negative for fracture or focal lesion. Sinuses/Orbits: No acute finding. Other: None. IMPRESSION: Normal head CT. Electronically Signed   By: Marijo Conception, M.D.   On: 05/07/2017 21:08    My personal review of EKG: Rhythm NSR   Assessment & Plan:    Active Problems:   Right leg numbness   1. Right leg numbness-place under observation, will obtain MRI/MRA brain in a.m., echocardiogram, carotid duplex.  Continue aspirin 81 mg p.o. daily.  Check hemoglobin A1c, lipid profile in a.m. 2. Hypertension-hold amlodipine, valsartan/HCTZ for permissive hypertension 3. Chronic kidney disease stage III-patient's creatinine is at baseline.  Follow BMP in a.m. 4. Hypokalemia-potassium is 2.8, will hold  HCTZ.  Replace potassium and check BMP in a.m. 5. Hyperlipidemia-continue Lipitor 6. Alcohol abuse-alcohol level was found to be 330 in the ED, start CIWA protocol.   DVT Prophylaxis-   Lovenox   AM Labs Ordered, also please review Full Orders  Family Communication: Admission, patients condition and plan of care including tests being ordered have been discussed with the patient and his daughter at bedside  who indicate understanding and agree with the plan and Code Status.  Code Status: Full code  Admission status: Observation  Time spent in minutes : 60 minutes   Oswald Hillock M.D on 05/07/2017 at 10:45 PM  Between 7am to 7pm - Pager - [phone]. After 7pm go to [email or web address] - password [facility]  [facility] - Office  [phone]

## 2017-05-07 NOTE — ED Triage Notes (Signed)
Pt c/o right leg numbness x 2 days.

## 2017-05-07 NOTE — ED Provider Notes (Signed)
Emergency Department Provider Note   I have reviewed the triage vital signs and the nursing notes.   HISTORY  Chief Complaint Numbness   HPI [name] is a 62 y.o. male with PMH of HTN, HLD, and blindness to the emergency department for evaluation of right lower leg numbness for the last 2 days.  The patient states that symptoms seem to begin abruptly with no obvious provoking factors.  He describes occasional numbness/tingling in bilateral hands but not in the feet.  He denies specific weakness in the right leg but has a subjective feeling of numbness there that starts at the knee and goes down.  Denies any speech changes or difficulty swallowing.  No numbness, tingling, weakness in the arms or contralateral leg.  No fevers or chills.  No injuries to the area.  Patient is able to walk but feels somewhat unsteady because of the perceived numbness.   Past Medical History:  Diagnosis Date  . Hyperlipidemia   . Hypertension     Patient Active Problem List   Diagnosis Date Noted  . 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  . OBESITY 01/08/2007  . HYPERLIPIDEMIA 12/25/2005  . TOBACCO ABUSE 12/25/2005  . BLINDNESS, [place], [place] DEFINITION 12/25/2005  . HYPERTENSION 12/25/2005  . TRIGGER FINGER 12/25/2005    Past Surgical History:  Procedure Laterality Date  . CATARACT EXTRACTION W/PHACO Right 09/26/2014   Procedure: CATARACT EXTRACTION PHACO AND INTRAOCULAR LENS PLACEMENT RIGHT EYE CDE=18.56;  Surgeon: Tonny Branch, MD;  Location: [facility];  Service: Ophthalmology;  Laterality: Right;  . ROTATOR CUFF REPAIR Left 2012  . TOOTH EXTRACTION N/A 1996    Current Outpatient Rx  . Order #: [id number] Class: Historical Med  . Order #: [id number] Class: Historical Med  . Order #: [id number] Class: Historical Med  . Order #: [id number] Class: Historical Med  . Order #: [id number] Class: Print  . Order #: [id number] Class: Print    Allergies Cortisone  No family  history on file.  Social History Social History   Tobacco Use  . Smoking status: Current Every Day Smoker    Packs/day: 1.00    Years: 20.00    Pack years: 20.00    Types: Cigarettes  . Smokeless tobacco: Current User  Substance Use Topics  . Alcohol use: Yes    Alcohol/week: 0.6 oz    Types: 1 Glasses of wine per week  . Drug use: No    Review of Systems  Constitutional: No fever/chills ENT: No sore throat. Cardiovascular: Denies chest pain. Respiratory: Denies shortness of breath. Gastrointestinal: No abdominal pain.  No nausea, no vomiting.  No diarrhea.  No constipation. Genitourinary: Negative for dysuria. Musculoskeletal: Negative for back pain. Skin: Negative for rash. Neurological: Negative for headaches, focal weakness. Positive right leg numbness below the knee.   10-point ROS otherwise negative.  ____________________________________________   PHYSICAL EXAM:  VITAL SIGNS: ED Triage Vitals [05/07/17 1951]  Enc Vitals Group     BP 124/66     Pulse Rate 76     Resp 17     Temp 97.9 F (36.6 C)     Temp Source Oral     SpO2 100 %     Weight 180 lb (81.6 kg)     Height 5\' 10"  (1.778 m)     Pain Score 0   Constitutional: Alert and oriented. Well appearing and in no acute distress. Eyes: Conjunctivae are normal. Pupils are non-reactive (baseline).  Head: Atraumatic. Nose: No congestion/rhinnorhea. Mouth/Throat: Mucous membranes are  moist.  Oropharynx non-erythematous. Neck: No stridor.   Cardiovascular: Normal rate, regular rhythm. Good peripheral circulation. Grossly normal heart sounds.   Respiratory: Normal respiratory effort.  No retractions. Lungs CTAB. Gastrointestinal: Soft and nontender. No distention.  Musculoskeletal: No lower extremity tenderness nor edema. No gross deformities of extremities. Neurologic:  Normal speech and language. Normal CN exam 2-12. Normal upper extremity strength and sensation. Normal strength and sensation in the B/L  LEs.  Skin:  Skin is warm, dry and intact. No rash noted.  ____________________________________________   LABS (all labs ordered are listed, but only abnormal results are displayed)  Labs Reviewed  CBC - Abnormal; Notable for the following components:      Result Value   WBC 11.6 (*)    RDW 17.5 (*)    Platelets 519 (*)    All other components within normal limits  COMPREHENSIVE METABOLIC PANEL - Abnormal; Notable for the following components:   Potassium 2.8 (*)    Glucose, Bld 111 (*)    BUN 21 (*)    Creatinine, Ser 1.32 (*)    AST 61 (*)    GFR calc non Af Amer 56 (*)    All other components within normal limits  PROTIME-INR  APTT  DIFFERENTIAL  ETHANOL  I-STAT TROPONIN, ED   ____________________________________________  EKG   EKG Interpretation  Date/Time:  Wednesday May 07 2017 20:35:45 EDT Ventricular Rate:  67 PR Interval:    QRS Duration: 99 QT Interval:  411 QTC Calculation: 434 R Axis:   78 Text Interpretation:  Sinus rhythm No STEMI.  Similar ST changes seen in prior EKG.  Confirmed by Nanda Quinton [phone]) on 05/07/2017 8:41:55 PM       ____________________________________________  RADIOLOGY  Ct Head Wo Contrast  Result Date: 05/07/2017 CLINICAL DATA:  Right leg numbness. EXAM: CT HEAD WITHOUT CONTRAST TECHNIQUE: Contiguous axial images were obtained from the base of the skull through the vertex without intravenous contrast. COMPARISON:  CT scan of Jun 28, 2015. FINDINGS: Brain: No evidence of acute infarction, hemorrhage, hydrocephalus, extra-axial collection or mass lesion/mass effect. Vascular: No hyperdense vessel or unexpected calcification. Skull: Normal. Negative for fracture or focal lesion. Sinuses/Orbits: No acute finding. Other: None. IMPRESSION: Normal head CT. Electronically Signed   By: Marijo Conception, M.D.   On: 05/07/2017 21:08    ____________________________________________   PROCEDURES  Procedure(s) performed:    Procedures  None ____________________________________________   INITIAL IMPRESSION / ASSESSMENT AND PLAN / ED COURSE  Pertinent labs & imaging results that were available during my care of the patient were reviewed by me and considered in my medical decision making (see chart for details).  Department for evaluation of numbness in the right lower leg for the past 2 days.  The numbness does not follow a dermatomal distribution.  He describes it as subjectively feeling like it starts at the knee and goes downward.  Symptoms are unilateral.  Lab perform eye exam he describes equal sensation to light touch in both lower extremities and has equal strength.  Patient does have risk factors for stroke but patient's exam and presentation are somewhat atypical for this.  Plan for CT imaging of the head along with labs and reevaluate.   08:50 PM Spoke with Tele-neurology regarding the case and exam. Recommends CT with labs. With numbness not in a dermatomal distribution they would recommend MRI and CVA workup if CT is negative.   09:23 PM Updated the patient and family at bedside regarding his  CT scan results and lab work which is unremarkable.  I discussed my impression and advised that he be admitted to the hospital for stroke workup and MRI in the morning.  The patient adamantly refused.  I attempted to address his concerns but he is unwilling to stay for further testing at this time.  We discussed that this decision could lead to delayed stroke diagnosis or result in a failure to adequately protect against future and possibly larger strokes.  Family are at bedside during the conversation.  Event and his decision and has capacity to make this decision for himself.  I provided the contact information for a local neurologist and encouraged him to call first thing tomorrow morning to schedule an appointment.  I also discussed that he is free to return to this or any other emergency department at any time  for additional evaluation and workup should he change his mind or if symptoms should worsen.  Patient is leaving [place].  ____________________________________________  FINAL CLINICAL IMPRESSION(S) / ED DIAGNOSES  Final diagnoses:  Right leg numbness    Note:  This document was prepared using Dragon voice recognition software and may include unintentional dictation errors.  Nanda Quinton, MD Emergency Medicine    Kameryn Davern, [facility], MD 05/07/17 2126

## 2017-05-08 ENCOUNTER — Observation Stay (HOSPITAL_COMMUNITY): Payer: Medicare HMO

## 2017-05-08 ENCOUNTER — Other Ambulatory Visit: Payer: Self-pay

## 2017-05-08 DIAGNOSIS — F1010 Alcohol abuse, uncomplicated: Secondary | ICD-10-CM | POA: Diagnosis not present

## 2017-05-08 DIAGNOSIS — D519 Vitamin B12 deficiency anemia, unspecified: Secondary | ICD-10-CM

## 2017-05-08 DIAGNOSIS — F10129 Alcohol abuse with intoxication, unspecified: Secondary | ICD-10-CM | POA: Diagnosis not present

## 2017-05-08 DIAGNOSIS — R200 Anesthesia of skin: Secondary | ICD-10-CM | POA: Diagnosis not present

## 2017-05-08 DIAGNOSIS — I639 Cerebral infarction, unspecified: Secondary | ICD-10-CM | POA: Diagnosis not present

## 2017-05-08 DIAGNOSIS — E785 Hyperlipidemia, unspecified: Secondary | ICD-10-CM

## 2017-05-08 DIAGNOSIS — E876 Hypokalemia: Secondary | ICD-10-CM

## 2017-05-08 DIAGNOSIS — F17200 Nicotine dependence, unspecified, uncomplicated: Secondary | ICD-10-CM | POA: Diagnosis not present

## 2017-05-08 DIAGNOSIS — I6523 Occlusion and stenosis of bilateral carotid arteries: Secondary | ICD-10-CM | POA: Diagnosis not present

## 2017-05-08 DIAGNOSIS — I10 Essential (primary) hypertension: Secondary | ICD-10-CM | POA: Diagnosis not present

## 2017-05-08 DIAGNOSIS — J439 Emphysema, unspecified: Secondary | ICD-10-CM | POA: Diagnosis not present

## 2017-05-08 LAB — LIPID PANEL
Cholesterol: 140 mg/dL (ref 0–200)
HDL: 94 mg/dL (ref >40)
LDL CALC: 33 mg/dL (ref 0–99)
Total CHOL/HDL Ratio: 1.5 RATIO
Triglycerides: 67 mg/dL (ref <150)
VLDL: 13 mg/dL (ref 0–40)

## 2017-05-08 LAB — BASIC METABOLIC PANEL
Anion gap: 14 (ref 5–15)
BUN: 23 mg/dL — ABNORMAL HIGH (ref 6–20)
CALCIUM: 9 mg/dL (ref 8.9–10.3)
CO2: 27 mmol/L (ref 22–32)
CREATININE: 1.37 mg/dL — AB (ref 0.61–1.24)
Chloride: 102 mmol/L (ref 101–111)
GFR calc non Af Amer: 54 mL/min — ABNORMAL LOW (ref >60)
Glucose, Bld: 91 mg/dL (ref 65–99)
Potassium: 3 mmol/L — ABNORMAL LOW (ref 3.5–5.1)
SODIUM: 143 mmol/L (ref 135–145)

## 2017-05-08 LAB — TSH: TSH: 0.366 u[IU]/mL (ref 0.350–4.500)

## 2017-05-08 LAB — VITAMIN B12: Vitamin B-12: 202 pg/mL (ref 180–914)

## 2017-05-08 LAB — MAGNESIUM: Magnesium: 2.4 mg/dL (ref 1.7–2.4)

## 2017-05-08 LAB — HEMOGLOBIN A1C
HEMOGLOBIN A1C: 5.6 % (ref 4.8–5.6)
MEAN PLASMA GLUCOSE: 114.02 mg/dL

## 2017-05-08 LAB — PHOSPHORUS: PHOSPHORUS: 3 mg/dL (ref 2.5–4.6)

## 2017-05-08 MED ORDER — VITAMIN B-12 1000 MCG PO TABS: 1000.0000 ug | ORAL_TABLET | Freq: Two times a day (BID) | ORAL | 3 refills | Status: DC

## 2017-05-08 MED ORDER — POTASSIUM CHLORIDE CRYS ER 20 MEQ PO TBCR
40.0000 meq | EXTENDED_RELEASE_TABLET | ORAL | Status: DC
  Administered 2017-05-08: 40 meq via ORAL
  Filled 2017-05-08: qty 2

## 2017-05-08 MED ORDER — POTASSIUM CHLORIDE CRYS ER 20 MEQ PO TBCR: 40.0000 meq | EXTENDED_RELEASE_TABLET | Freq: Every day | ORAL | 0 refills | Status: DC

## 2017-05-08 MED ORDER — ADULT MULTIVITAMIN W/MINERALS CH: 1.0000 | ORAL_TABLET | Freq: Every day | ORAL | 1 refills | Status: DC

## 2017-05-08 NOTE — Care Management Obs Status (Signed)
[facility] NOTIFICATION   Patient Details  Name: FAHD GALEA MRN: [id number] Date of Birth: March 02, 1955   Medicare Observation Status Notification Given:  Yes    Cenia Zaragosa, Chauncey Reading, RN 05/08/2017, 2:17 PM

## 2017-05-08 NOTE — Evaluation (Signed)
Physical Therapy Evaluation Patient Details Name: [name] MRN: [id number] DOB: February 11, 1955 Today's Date: 05/08/2017   History of Present Illness  [name]  is a 62 y.o. male, with history of hypertension, hyperlipidemia, blindness came to ED with complaints of right leg numbness.  Patient says symptoms started yesterday.  Denies any weakness of leg.  Also has a history of occasional numbness/tingling of hands, was prescribed gabapentin by PCP which did not help.  Patient denies difficulty with speech or swallowing.  No previous history of stroke.  No complains of back pain.  Patient is able to walk without any difficulty.    Clinical Impression  Patient functioning at baseline for functional mobility and gait, other than c/o numbness RLE from calf to entire right foot, no loss of balance when ambulating, required hand held assist due to blindness.  Plan: Patient discharged from physical therapy care of nursing for ambulation as tolerated for length of stay.    Follow Up Recommendations No PT follow up    Equipment Recommendations  None recommended by PT    Recommendations for Other Services       Precautions / Restrictions Precautions Precautions: Fall Precaution Comments: legally blind Restrictions Weight Bearing Restrictions: No      Mobility  Bed Mobility Overal bed mobility: Modified Independent                Transfers Overall transfer level: Modified independent               General transfer comment: requires cueing due to blindness  Ambulation/Gait Ambulation/Gait assistance: Min guard Ambulation Distance (Feet): 120 Feet Assistive device: 1 person hand held assist Gait Pattern/deviations: WFL(Within Functional Limits)   Gait velocity interpretation: Below normal speed for age/gender General Gait Details: grossly WFL other than requiring hand held guidance due to blindness  Stairs            Wheelchair Mobility    Modified  [place] (Stroke Patients Only)       Balance Overall balance assessment: No apparent balance deficits (not formally assessed)                                           Pertinent Vitals/Pain Pain Assessment: Faces Faces Pain Scale: Hurts little more Pain Location: RLE from calf to entire foot Pain Descriptors / Indicators: Numbness Pain Intervention(s): Limited activity within patient's tolerance;Monitored during session    Home Living Family/patient expects to be discharged to:: Private residence Living Arrangements: Other relatives(lives with sister ) Available Help at Discharge: Family(daughter ) Type of Home: House Home Access: Stairs to enter Entrance Stairs-Rails: None Entrance Stairs-Number of Steps: none in front, 2 in back Home Layout: One level Home Equipment: Grab bars - tub/shower;Cane - single point      Prior Function Level of Independence: Needs assistance   Gait / Transfers Assistance Needed: ambulate household distances without AD, uses cane for community  ADL's / Homemaking Assistance Needed: his daughter helps        Hand Dominance        Extremity/Trunk Assessment   Upper Extremity Assessment Upper Extremity Assessment: Defer to OT evaluation    Lower Extremity Assessment Lower Extremity Assessment: Overall WFL for tasks assessed;RLE deficits/detail;LLE deficits/detail RLE Deficits / Details: grossly 5/5, except quadriceps grossly -4/5 LLE Deficits / Details: grossly 5/5    Cervical / Trunk Assessment Cervical /  Trunk Assessment: Normal  Communication   Communication: No difficulties  Cognition Arousal/Alertness: Awake/alert Behavior During Therapy: WFL for tasks assessed/performed Overall Cognitive Status: Within Functional Limits for tasks assessed                                        General Comments      Exercises     Assessment/Plan    PT Assessment Patent does not need any further PT  services  PT Problem List         PT Treatment Interventions      PT Goals (Current goals can be found in the Care Plan section)  Acute Rehab PT Goals Patient Stated Goal: return home PT Goal Formulation: With patient/family Time For Goal Achievement: 2017-05-29 Potential to Achieve Goals: Good    Frequency     Barriers to discharge        Co-evaluation               AM-PAC PT "6 Clicks" Daily Activity  Outcome Measure Difficulty turning over in bed (including adjusting bedclothes, sheets and blankets)?: None Difficulty moving from lying on back to sitting on the side of the bed? : None Difficulty sitting down on and standing up from a chair with arms (e.g., wheelchair, bedside commode, etc,.)?: None Help needed moving to and from a bed to chair (including a wheelchair)?: None Help needed walking in hospital room?: A Little Help needed climbing 3-5 steps with a railing? : A Little 6 Click Score: 22    End of Session   Activity Tolerance: Patient tolerated treatment well Patient left: in bed;with call bell/phone within reach;with family/visitor present Nurse Communication: Mobility status PT Visit Diagnosis: Unsteadiness on feet (R26.81);Other abnormalities of gait and mobility (R26.89);Muscle weakness (generalized) (M62.81)    Time: 1751-0258 PT Time Calculation (min) (ACUTE ONLY): 22 min   Charges:   PT Evaluation $PT Eval Low Complexity: 1 Low PT Treatments $Therapeutic Activity: 8-22 mins   PT G Codes:        9:49 AM, May 29, 2017 Lonell Grandchild, MPT Physical Therapist with [facility] [phone] office [phone] mobile phone

## 2017-05-08 NOTE — Progress Notes (Addendum)
Pt was in MRI when new reassessment was due (0800) so assessment was done when patient returned to the room (0847).

## 2017-05-08 NOTE — Progress Notes (Addendum)
Pt was in MRI at 0800 so vitals were gotten on his arrival back to room (0847).

## 2017-05-08 NOTE — Discharge Summary (Signed)
Physician Discharge Summary  DEAKIN LACEK WUJ:[id number] DOB: 1955/04/18 DOA: 05/07/2017  PCP: Iona Beard, MD  Admit date: 05/07/2017 Discharge date: 05/08/2017  Time spent: 35 minutes  Recommendations for Outpatient Follow-up:  1. Repeat basic metabolic panel to follow electrolytes and renal function 2. Follow BP and adjust antihypertensive regimen as needed  3. Repeat B12 level in 3-4 weeks   Discharge Diagnoses:  Hyperlipidemia TOBACCO ABUSE BLINDNESS, LEGAL, [place] DEFINITION Essential hypertension Right leg numbness Hypokalemia Alcohol abuse with intoxication ([place]) vitamin B12 deficiency   Discharge Condition: Stable and improved.  Patient discharged home with instruction to follow-up with PCP in 10 days.  Diet recommendation: Heart healthy diet  Filed Weights   05/07/17 1951  Weight: 81.6 kg (180 lb)    History of present illness:  As per H&P written by Dr. Darrick Meigs 05/07/17. 62 y.o. male, with history of hypertension, hyperlipidemia, blindness came to ED with complaints of right leg numbness.  Patient says symptoms started yesterday.  Denies any weakness of leg.  Also has a history of occasional numbness/tingling of hands, was prescribed gabapentin by PCP which did not help.  Patient denies difficulty with speech or swallowing.  No previous history of stroke.  No complains of back pain.  Patient is able to walk without any difficulty. He denies chest pain or shortness of breath. Denies nausea vomiting or diarrhea. Denies dysuria, urgency or frequency of urination. No history of CAD No history of previous stroke or seizures  In the ED, alcohol level was found to be 330 Potassium 2.8, creatinine 1.32 Patient was evaluated by tele neurology, and stroke evaluation was recommended. CT head was negative in the ED.  Hospital Course:  1-Right leg numbness: -Appears to be associated with hypokalemia and low B12 level, most likely in the setting of chronic alcohol  abuse. -Stroke workup negative -Normal TSH -Electrolytes has been repleted and patient discharge on potassium supplementation and B12 supplementation. -instructed to cut down/quit alcohol consumption.  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  4-hyperlipidemia: -Continue Lipitor  5-mild left ICA stenosis: -Continue aspirin and statins -Continue blood pressure control.  6-hypokalemia -As mentioned above patient discharge on potassium supplementation -will recommend repeat basic metabolic panel to reassess electrolytes trend. -Magnesium and phosphorus were within normal limits.  7-alcohol abuse -Patient reports essentially drinking on daily basis but never achieving being drunk. -He was monitored under CIWA protocol; no withdrawal appreciated -Call cessation counseling has been provided -Patient discharged on multivitamin to supplement thiamine and folic acid -N56 level was also appreciated to be low and supplementation was initiated.  8-B12 deficiency -most likely in the setting of alcohol abuse -will start oral supplementation -follow up levels in the next 3-4 weeks   Procedures:  Below for x-ray reports.  Carotid duplex: Demonstrating mild left ICA stenosis (< 50% stenosis) and bilateral antegrade vertebral flow..  Consultations:  None  Discharge Exam: Vitals:   05/08/17 1127 05/08/17 1356  BP:  124/79  Pulse:  73  Resp:  18  Temp:  98.4 F (36.9 C)  SpO2: 96% 99%    General: Afebrile, no chest pain, no shortness of breath, no nausea, no vomiting.  Patient reports improvement in the ongoing numbness sensation of his right leg and foot.  Denies any focal weakness and is capable of follow commands appropriately,  speaking in full sentences and not demonstrating any dysarthria. Cardiovascular: S1 and S2, no rubs, no gallops, no JVD Respiratory: Clear to auscultation bilaterally, normal respiratory effort; no using accessory muscles. Abdomen: Soft, nontender, nondistended, positive bowel sounds Extremities: No edema, no cyanosis or clubbing; patient reports some numbness on his right food, no focal weakness. Neurologic exam: legally blind (not new), no focal deficit, no uvula deviation, no dysarthria, no pronator drift, MS 5/5/ bilaterally and symmetrically.  Discharge Instructions   Discharge Instructions    Diet - low sodium heart healthy   Complete by:  As directed    Discharge instructions   Complete by:  As directed    -Decrease/stop alcohol consumption -Maintain adequate hydration -Take medications as prescribed -Arrange follow-up with PCP in 10 days. -follow heart healthy diet     Allergies as of 05/08/2017      Reactions   Cortisone    CORTISONE INJECTION MADE HIM FEEL LIKE SOMETHING WAS CRAWLING ALL OVER HIM.       Medication List    STOP taking these medications   oxyCODONE-acetaminophen 5-325 MG tablet Commonly known as:  PERCOCET/ROXICET     TAKE these medications   amLODipine 10 MG tablet Commonly known as:  NORVASC Take 10 mg by mouth daily.   aspirin 81 MG tablet Take 81 mg by mouth daily.   atorvastatin 10 MG tablet Commonly known as:  LIPITOR Take 10 mg by mouth daily.   multivitamin with minerals Tabs tablet Take 1 tablet by mouth daily. Start taking on:  05/09/2017   naproxen 500 MG tablet Commonly known as:  NAPROSYN Take 1 tablet (500 mg total) by mouth 2 (two) times daily with a meal.   potassium chloride SA 20 MEQ tablet Commonly known as:  K-DUR,KLOR-CON Take 2 tablets (40 mEq total) by mouth daily.   valsartan-hydrochlorothiazide 160-25 MG tablet Commonly known as:  DIOVAN-HCT Take 1 tablet by mouth daily.   vitamin B-12 1000 MCG  tablet Commonly known as:  CYANOCOBALAMIN Take 1 tablet (1,000 mcg total) by mouth 2 (two) times daily.      Allergies  Allergen Reactions  . Cortisone     CORTISONE INJECTION MADE HIM FEEL LIKE SOMETHING WAS CRAWLING ALL OVER HIM.    Follow-up Information    Iona Beard, MD. Call in 1 day.   Specialty:  Family Medicine Contact information: [address] [place] [address] [phone]        Phillips Odor, MD. Call in 1 day(s).   Specialty:  Neurology Why:  to schedule a follow up appointment Contact information: [address] [place] [facility] [address] [phone]           The results of significant diagnostics from this hospitalization (including imaging, microbiology, ancillary and laboratory) are listed below for reference.    Significant Diagnostic Studies: Dg Chest 2 View  Result Date: 05/08/2017 CLINICAL DATA:  Admission for stroke. History of hypertension. Current smoker. EXAM: CHEST - 2 VIEW COMPARISON:  12/10/2013 FINDINGS: Mild emphysematous changes suggested in the lungs. Central interstitial changes suggesting chronic bronchitis. No airspace disease or consolidation in the lungs. No blunting of costophrenic angles. No pneumothorax. Mediastinal contours appear intact. Calcification of the aorta. IMPRESSION: Emphysematous and chronic bronchitic changes in the lungs. No evidence of active pulmonary disease. Electronically Signed   By: Lucienne Capers M.D.   On: 05/08/2017 01:30   Ct Head Wo Contrast  Result Date: 05/07/2017 CLINICAL DATA:  Right leg numbness. EXAM: CT  HEAD WITHOUT CONTRAST TECHNIQUE: Contiguous axial images were obtained from the base of the skull through the vertex without intravenous contrast. COMPARISON:  CT scan of Jun 28, 2015. FINDINGS: Brain: No evidence of acute infarction, hemorrhage, hydrocephalus, extra-axial collection or mass lesion/mass effect. Vascular: No hyperdense vessel or unexpected calcification. Skull: Normal.  Negative for fracture or focal lesion. Sinuses/Orbits: No acute finding. Other: None. IMPRESSION: Normal head CT. Electronically Signed   By: Marijo Conception, M.D.   On: 05/07/2017 21:08   Mr Brain Wo Contrast  Result Date: 05/08/2017 CLINICAL DATA:  Right leg numbness. History of fall with head injury in 06/2015. EXAM: MRI HEAD WITHOUT CONTRAST MRA HEAD WITHOUT CONTRAST TECHNIQUE: Multiplanar, multiecho pulse sequences of the brain and surrounding structures were obtained without intravenous contrast. Angiographic images of the head were obtained using MRA technique without contrast. COMPARISON:  Head CT 05/07/2017 FINDINGS: MRI HEAD FINDINGS Brain: There is no evidence of acute infarct, mass effect, or extra-axial fluid collection. Very mild generalized cerebral atrophy is within normal limits for age. There is focal susceptibility artifact in the parasagittal left frontal lobe above the level of the lateral ventricles suggestive of hemosiderin staining possibly from prior focal subarachnoid hemorrhage or parenchymal contusion. There was no evidence of acute hemorrhage in this location on yesterday's CT. A small amount of T2 hyperintensity is present in the subcortical white matter in this location, potentially with some cortical involvement as well. There is no mass effect or substantial volume loss. There is no other evidence of old intracranial hemorrhage. Scattered small foci of T2 hyperintensity elsewhere in the cerebral white matter bilaterally are nonspecific but compatible with minimal chronic small vessel ischemic disease. Vascular: Major intracranial vascular flow voids are preserved. Skull and upper cervical spine: Unremarkable bone marrow signal. Sinuses/Orbits: Right cataract extraction. Mild left maxillary sinus mucosal thickening. Clear mastoid air cells. Other: None. MRA HEAD FINDINGS The visualized distal vertebral arteries are widely patent to the basilar with the left being slightly larger  than the right. Patent left PICA, right AICA, and bilateral SCA origins are identified. The basilar artery is widely patent. There is a patent right posterior communicating artery. The left posterior communicating artery is diminutive or absent. PCAs are patent without evidence of significant stenosis. The internal carotid arteries are widely patent from skull base to carotid termini. ACAs and MCAs are patent without evidence of proximal branch occlusion or significant stenosis. No aneurysm is identified. IMPRESSION: 1. No evidence of acute intracranial abnormality. 2. Focal signal abnormality in the parasagittal left frontal lobe. This may reflect a remote traumatic insult with old blood products, however given its mildly unusual appearance and lack of significant volume loss postcontrast brain MRI is recommended for further evaluation. 3. Negative head MRA. Electronically Signed   By: Logan Bores M.D.   On: 05/08/2017 08:48   US Carotid Bilateral (at [facility] Only)  Result Date: 05/08/2017 CLINICAL DATA:  Right lower extremity numbness. Hypertension, hyperlipidemia, history of ethanol and tobacco abuse. EXAM: BILATERAL CAROTID DUPLEX ULTRASOUND TECHNIQUE: Pearline Cables scale imaging, color Doppler and duplex ultrasound was performed of bilateral carotid and vertebral arteries in the neck. COMPARISON:  None. TECHNIQUE: Quantification of carotid stenosis is based on velocity parameters that correlate the residual internal carotid diameter with NASCET-based stenosis levels, using the diameter of the distal internal carotid lumen as the denominator for stenosis measurement. The following velocity measurements were obtained: PEAK SYSTOLIC/END DIASTOLIC RIGHT ICA:  125/28cm/sec CCA:                     563/87FI/EPP SYSTOLIC ICA/CCA RATIO:  1.1 DIASTOLIC ICA/CCA RATIO: 1.1 ECA:                     227cm/sec LEFT ICA:                     86/28cm/sec CCA:                     295/18AC/ZYS SYSTOLIC ICA/CCA  RATIO:  0.5 DIASTOLIC ICA/CCA RATIO: 0.8 ECA:                     206cm/sec FINDINGS: RIGHT CAROTID ARTERY: Mild intimal thickening in the bulb. No definite plaque accumulation or stenosis. Normal waveforms and color Doppler signal. RIGHT VERTEBRAL ARTERY:  Normal flow direction and waveform. LEFT CAROTID ARTERY: Minimal calcified plaque in the carotid bulb and at the ICA origin. No high-grade stenosis. Normal waveforms and color Doppler signal. LEFT VERTEBRAL ARTERY: Normal flow direction and waveform. IMPRESSION: 1. Mild left carotid bifurcation and proximal ICA plaque resulting in less than 50% diameter stenosis. 2.  Antegrade bilateral vertebral arterial flow. Electronically Signed   By: Lucrezia Europe M.D.   On: 05/08/2017 08:53   Mr Jodene Nam Head/brain AY Cm  Result Date: 05/08/2017 CLINICAL DATA:  Right leg numbness. History of fall with head injury in 06/2015. EXAM: MRI HEAD WITHOUT CONTRAST MRA HEAD WITHOUT CONTRAST TECHNIQUE: Multiplanar, multiecho pulse sequences of the brain and surrounding structures were obtained without intravenous contrast. Angiographic images of the head were obtained using MRA technique without contrast. COMPARISON:  Head CT 05/07/2017 FINDINGS: MRI HEAD FINDINGS Brain: There is no evidence of acute infarct, mass effect, or extra-axial fluid collection. Very mild generalized cerebral atrophy is within normal limits for age. There is focal susceptibility artifact in the parasagittal left frontal lobe above the level of the lateral ventricles suggestive of hemosiderin staining possibly from prior focal subarachnoid hemorrhage or parenchymal contusion. There was no evidence of acute hemorrhage in this location on yesterday's CT. A small amount of T2 hyperintensity is present in the subcortical white matter in this location, potentially with some cortical involvement as well. There is no mass effect or substantial volume loss. There is no other evidence of old intracranial hemorrhage.  Scattered small foci of T2 hyperintensity elsewhere in the cerebral white matter bilaterally are nonspecific but compatible with minimal chronic small vessel ischemic disease. Vascular: Major intracranial vascular flow voids are preserved. Skull and upper cervical spine: Unremarkable bone marrow signal. Sinuses/Orbits: Right cataract extraction. Mild left maxillary sinus mucosal thickening. Clear mastoid air cells. Other: None. MRA HEAD FINDINGS The visualized distal vertebral arteries are widely patent to the basilar with the left being slightly larger than the right. Patent left PICA, right AICA, and bilateral SCA origins are identified. The basilar artery is widely patent. There is a patent right posterior communicating artery. The left posterior communicating artery is diminutive or absent. PCAs are patent without evidence of significant stenosis. The internal carotid arteries are widely patent from skull base to carotid termini. ACAs and MCAs are patent without evidence of proximal branch occlusion or significant stenosis. No aneurysm is identified. IMPRESSION: 1. No evidence of acute intracranial abnormality. 2. Focal signal abnormality in the parasagittal left frontal lobe. This may reflect a remote traumatic insult with old blood products, however given its mildly  unusual appearance and lack of significant volume loss postcontrast brain MRI is recommended for further evaluation. 3. Negative head MRA. Electronically Signed   By: Logan Bores M.D.   On: 05/08/2017 08:48   Labs: Basic Metabolic Panel: Recent Labs  Lab 05/07/17 2021 05/08/17 0553  NA 140 143  K 2.8* 3.0*  CL 102 102  CO2 24 27  GLUCOSE 111* 91  BUN 21* 23*  CREATININE 1.32* 1.37*  CALCIUM 9.4 9.0  MG  --  2.4  PHOS  --  3.0   Liver Function Tests: Recent Labs  Lab 05/07/17 2021  AST 61*  ALT 33  ALKPHOS 79  BILITOT 0.6  PROT 7.8  ALBUMIN 4.6   CBC: Recent Labs  Lab 05/07/17 2021  WBC 11.6*  NEUTROABS 7.2  HGB  14.2  HCT 42.1  MCV 84.0  PLT 519*    Signed:  Barton Dubois MD.  [facility] 05/08/2017, 2:47 PM

## 2017-05-08 NOTE — Progress Notes (Signed)
OT Cancellation Note  Patient Details Name: KLAYTEN JOLLIFF MRN: [id number] DOB: 1956-01-24   Cancelled Treatment:    Reason Eval/Treat Not Completed: Patient at procedure or test/ unavailable. Pt preparing to leave for MRI on OT arrival. On observation pt able to lean right and left on the bed, reach in each pocket with BUE to remove items. Pt able to perform bed mobility independently and scoot over to stretcher independently without difficulty. Will check back on pt and complete full evaluation at a later time.    Guadelupe Sabin, OTR/L  [phone] 05/08/2017, 7:46 AM

## 2017-05-09 LAB — HIV ANTIBODY (ROUTINE TESTING W REFLEX): HIV Screen 4th Generation wRfx: NONREACTIVE

## 2017-06-21 DIAGNOSIS — H548 Legal blindness, as defined in USA: Secondary | ICD-10-CM | POA: Diagnosis not present

## 2017-06-21 DIAGNOSIS — E782 Mixed hyperlipidemia: Secondary | ICD-10-CM | POA: Diagnosis not present

## 2017-06-21 DIAGNOSIS — E079 Disorder of thyroid, unspecified: Secondary | ICD-10-CM | POA: Diagnosis not present

## 2017-06-21 DIAGNOSIS — I10 Essential (primary) hypertension: Secondary | ICD-10-CM | POA: Diagnosis not present

## 2017-06-21 DIAGNOSIS — E785 Hyperlipidemia, unspecified: Secondary | ICD-10-CM | POA: Diagnosis not present

## 2017-06-21 DIAGNOSIS — F1020 Alcohol dependence, uncomplicated: Secondary | ICD-10-CM | POA: Diagnosis not present

## 2017-06-21 DIAGNOSIS — E041 Nontoxic single thyroid nodule: Secondary | ICD-10-CM | POA: Diagnosis not present

## 2018-03-04 ENCOUNTER — Emergency Department (HOSPITAL_COMMUNITY): Payer: Medicare HMO

## 2018-03-04 ENCOUNTER — Encounter (HOSPITAL_COMMUNITY): Payer: Self-pay

## 2018-03-04 ENCOUNTER — Emergency Department (HOSPITAL_COMMUNITY)
Admission: EM | Admit: 2018-03-04 | Discharge: 2018-03-04 | Discharge status: Against Medical Advice | Payer: Medicare HMO | Attending: Emergency Medicine | Admitting: Emergency Medicine

## 2018-03-04 ENCOUNTER — Other Ambulatory Visit: Payer: Self-pay

## 2018-03-04 DIAGNOSIS — I10 Essential (primary) hypertension: Secondary | ICD-10-CM | POA: Insufficient documentation

## 2018-03-04 DIAGNOSIS — Z79899 Other long term (current) drug therapy: Secondary | ICD-10-CM | POA: Diagnosis not present

## 2018-03-04 DIAGNOSIS — F17210 Nicotine dependence, cigarettes, uncomplicated: Secondary | ICD-10-CM | POA: Insufficient documentation

## 2018-03-04 DIAGNOSIS — Z7982 Long term (current) use of aspirin: Secondary | ICD-10-CM | POA: Insufficient documentation

## 2018-03-04 DIAGNOSIS — R52 Pain, unspecified: Secondary | ICD-10-CM | POA: Diagnosis not present

## 2018-03-04 DIAGNOSIS — R1031 Right lower quadrant pain: Secondary | ICD-10-CM | POA: Diagnosis not present

## 2018-03-04 DIAGNOSIS — R109 Unspecified abdominal pain: Secondary | ICD-10-CM | POA: Diagnosis not present

## 2018-03-04 DIAGNOSIS — F1010 Alcohol abuse, uncomplicated: Secondary | ICD-10-CM | POA: Diagnosis not present

## 2018-03-04 DIAGNOSIS — F10929 Alcohol use, unspecified with intoxication, unspecified: Secondary | ICD-10-CM | POA: Diagnosis not present

## 2018-03-04 HISTORY — DX: Alcohol abuse, uncomplicated: F10.10

## 2018-03-04 HISTORY — DX: Unspecified visual loss: H54.7

## 2018-03-04 LAB — CBC WITH DIFFERENTIAL/PLATELET
Abs Immature Granulocytes: 0.04 10*3/uL (ref 0.00–0.07)
Basophils Absolute: 0.1 10*3/uL (ref 0.0–0.1)
Basophils Relative: 1 %
EOS ABS: 0.2 10*3/uL (ref 0.0–0.5)
EOS PCT: 1 %
HEMATOCRIT: 44.7 % (ref 39.0–52.0)
Hemoglobin: 14.3 g/dL (ref 13.0–17.0)
Immature Granulocytes: 0 %
LYMPHS ABS: 2.9 10*3/uL (ref 0.7–4.0)
Lymphocytes Relative: 28 %
MCH: 27.8 pg (ref 26.0–34.0)
MCHC: 32 g/dL (ref 30.0–36.0)
MCV: 86.8 fL (ref 80.0–100.0)
MONOS PCT: 8 %
Monocytes Absolute: 0.8 10*3/uL (ref 0.1–1.0)
Neutro Abs: 6.5 10*3/uL (ref 1.7–7.7)
Neutrophils Relative %: 62 %
Platelets: 484 10*3/uL — ABNORMAL HIGH (ref 150–400)
RBC: 5.15 MIL/uL (ref 4.22–5.81)
RDW: 17.2 % — AB (ref 11.5–15.5)
WBC: 10.5 10*3/uL (ref 4.0–10.5)
nRBC: 0 % (ref 0.0–0.2)

## 2018-03-04 LAB — COMPREHENSIVE METABOLIC PANEL
ALBUMIN: 4.7 g/dL (ref 3.5–5.0)
ALK PHOS: 66 U/L (ref 38–126)
ALT: 28 U/L (ref 0–44)
AST: 30 U/L (ref 15–41)
Anion gap: 14 (ref 5–15)
BILIRUBIN TOTAL: 0.1 mg/dL — AB (ref 0.3–1.2)
BUN: 25 mg/dL — ABNORMAL HIGH (ref 8–23)
CALCIUM: 9.6 mg/dL (ref 8.9–10.3)
CO2: 24 mmol/L (ref 22–32)
CREATININE: 1.59 mg/dL — AB (ref 0.61–1.24)
Chloride: 99 mmol/L (ref 98–111)
GFR calc Af Amer: 53 mL/min — ABNORMAL LOW (ref >60)
GFR calc non Af Amer: 46 mL/min — ABNORMAL LOW (ref >60)
GLUCOSE: 114 mg/dL — AB (ref 70–99)
Potassium: 2.8 mmol/L — ABNORMAL LOW (ref 3.5–5.1)
Sodium: 137 mmol/L (ref 135–145)
Total Protein: 8 g/dL (ref 6.5–8.1)

## 2018-03-04 MED ORDER — POTASSIUM CHLORIDE 10 MEQ/100ML IV SOLN: 10.0000 meq | Freq: Once | INTRAVENOUS | Status: DC

## 2018-03-04 MED ORDER — POTASSIUM CHLORIDE CRYS ER 20 MEQ PO TBCR: 40.0000 meq | EXTENDED_RELEASE_TABLET | Freq: Once | ORAL | Status: DC

## 2018-03-04 MED ORDER — KETOROLAC TROMETHAMINE 60 MG/2ML IM SOLN: 30.0000 mg | Freq: Once | INTRAMUSCULAR | Status: DC

## 2018-03-04 NOTE — ED Notes (Signed)
Pt agreed to stay. Lab in room

## 2018-03-04 NOTE — ED Provider Notes (Signed)
[facility] EMERGENCY DEPARTMENT Provider Note   CSN: [id number] Arrival date & time: 03/04/18  2014     History   Chief Complaint Chief Complaint  Patient presents with  . Abdominal Pain    4 days    HPI [name] is a 63 y.o. male.  Patient complains of right flank pain.  The history is provided by the patient.  Abdominal Pain  Pain location:  R flank Pain quality: aching   Pain radiates to:  Back Pain severity:  Moderate Onset quality:  Sudden Timing:  Constant Progression:  Worsening Chronicity:  New Context: not alcohol use   Relieved by:  Nothing Associated symptoms: no chest pain, no cough, no diarrhea, no fatigue and no hematuria     Past Medical History:  Diagnosis Date  . Alcohol abuse   . Blind   . Hyperlipidemia   . Hypertension     Patient Active Problem List   Diagnosis Date Noted  . Alcohol abuse with intoxication ([place])   . Anemia due to vitamin B12 deficiency   . Right leg numbness 05/07/2017  . Alcohol abuse 05/07/2017  . Hypokalemia 05/07/2017  . 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  . OBESITY 01/08/2007  . Hyperlipidemia 12/25/2005  . TOBACCO ABUSE 12/25/2005  . BLINDNESS, [place], [place] DEFINITION 12/25/2005  . Essential hypertension 12/25/2005  . TRIGGER FINGER 12/25/2005    Past Surgical History:  Procedure Laterality Date  . CATARACT EXTRACTION W/PHACO Right 09/26/2014   Procedure: CATARACT EXTRACTION PHACO AND INTRAOCULAR LENS PLACEMENT RIGHT EYE CDE=18.56;  Surgeon: Tonny Branch, MD;  Location: [facility];  Service: Ophthalmology;  Laterality: Right;  . ROTATOR CUFF REPAIR Left 2012  . TOOTH EXTRACTION N/A 1996        Home Medications    Prior to Admission medications   Medication Sig Start Date End Date Taking? Authorizing Provider  amLODipine (NORVASC) 10 MG tablet Take 10 mg by mouth daily.    [provider]  aspirin 81 MG tablet Take 81 mg by mouth daily.    [provider]  atorvastatin (LIPITOR) 10 MG tablet Take 10 mg by mouth daily.    [provider]  Multiple Vitamin (MULTIVITAMIN WITH MINERALS) TABS tablet Take 1 tablet by mouth daily. 05/09/17   Barton Dubois, MD  naproxen (NAPROSYN) 500 MG tablet Take 1 tablet (500 mg total) by mouth 2 (two) times daily with a meal. 05/07/15   Triplett, Tammy, PA-C  potassium chloride SA (K-DUR,KLOR-CON) 20 MEQ tablet Take 2 tablets (40 mEq total) by mouth daily. 05/08/17   Barton Dubois, MD  valsartan-hydrochlorothiazide (DIOVAN-HCT) 160-25 MG per tablet Take 1 tablet by mouth daily.    [provider]  vitamin B-12 (CYANOCOBALAMIN) 1000 MCG tablet Take 1 tablet (1,000 mcg total) by mouth 2 (two) times daily. 05/08/17   Barton Dubois, MD    Family History History reviewed. No pertinent family history.  Social History Social History   Tobacco Use  . Smoking status: Current Every Day Smoker    Packs/day: 1.00    Years: 20.00    Pack years: 20.00    Types: Cigarettes  . Smokeless tobacco: Current User  Substance Use Topics  . Alcohol use: Yes    Alcohol/week: 1.0 standard drinks    Types: 1 Glasses of wine per week  . Drug use: No     Allergies   Cortisone   Review of Systems Review of Systems  Constitutional: Negative for appetite change  and fatigue.  HENT: Negative for congestion, ear discharge and sinus pressure.   Eyes: Negative for discharge.  Respiratory: Negative for cough.   Cardiovascular: Negative for chest pain.  Gastrointestinal: Negative for diarrhea.       Flank pain  Genitourinary: Negative for frequency and hematuria.  Musculoskeletal: Negative for back pain.  Skin: Negative for rash.  Neurological: Negative for seizures and headaches.  Psychiatric/Behavioral: Negative for hallucinations.     Physical Exam Updated Vital Signs Ht 5\' 8"  (1.727 m)   Wt 90.7 kg   BMI 30.41 kg/m   Physical Exam Vitals signs and nursing note reviewed.    Constitutional:      Appearance: He is well-developed.  HENT:     Head: Normocephalic.     Nose: Nose normal.  Eyes:     General: No scleral icterus.    Conjunctiva/sclera: Conjunctivae normal.  Neck:     Musculoskeletal: Neck supple.     Thyroid: No thyromegaly.  Cardiovascular:     Rate and Rhythm: Normal rate and regular rhythm.     Heart sounds: No murmur. No friction rub. No gallop.   Pulmonary:     Breath sounds: No stridor. No wheezing or rales.  Chest:     Chest wall: No tenderness.  Abdominal:     General: There is no distension.     Tenderness: There is no abdominal tenderness. There is no rebound.  Genitourinary:    Comments: Tender right flank Musculoskeletal: Normal range of motion.  Lymphadenopathy:     Cervical: No cervical adenopathy.  Skin:    Findings: No erythema or rash.  Neurological:     Mental Status: He is oriented to person, place, and time.     Motor: No abnormal muscle tone.     Coordination: Coordination normal.  Psychiatric:        Behavior: Behavior normal.      ED Treatments / Results  Labs (all labs ordered are listed, but only abnormal results are displayed) Labs Reviewed  COMPREHENSIVE METABOLIC PANEL - Abnormal; Notable for the following components:      Result Value   Potassium 2.8 (*)    Glucose, Bld 114 (*)    BUN 25 (*)    Creatinine, Ser 1.59 (*)    Total Bilirubin 0.1 (*)    GFR calc non Af Amer 46 (*)    GFR calc Af Amer 53 (*)    All other components within normal limits  CBC WITH DIFFERENTIAL/PLATELET - Abnormal; Notable for the following components:   RDW 17.2 (*)    Platelets 484 (*)    All other components within normal limits  URINALYSIS, ROUTINE W REFLEX MICROSCOPIC    EKG None  Radiology No results found.  Procedures Procedures (including critical care time)  Medications Ordered in ED Medications  potassium chloride 10 mEq in 100 mL IVPB (10 mEq Intravenous Refused 03/04/18 2237)  ketorolac  (TORADOL) injection 30 mg (30 mg Intramuscular Refused 03/04/18 2237)  potassium chloride SA (K-DUR,KLOR-CON) CR tablet 40 mEq (40 mEq Oral Refused 03/04/18 2238)     Initial Impression / Assessment and Plan / ED Course  I have reviewed the triage vital signs and the nursing notes.  Pertinent labs & imaging results that were available during my care of the patient were reviewed by me and considered in my medical decision making (see chart for details).     Patient left before x-rays and labs were finished.  He decided to leave AMA  Final Clinical Impressions(s) / ED Diagnoses   Final diagnoses:  Flank pain    ED Discharge Orders    None       Milton Ferguson, MD 03/04/18 2243

## 2018-03-04 NOTE — ED Notes (Signed)
Pt escorted to the lobby

## 2018-03-04 NOTE — ED Notes (Signed)
Pt unable to void enough for a urine sample

## 2018-03-04 NOTE — ED Notes (Signed)
Pt refused medications. And scans. Saying he wants to get some crackers and wants to got o waiting room. EDP notified of the same.

## 2018-03-04 NOTE — ED Notes (Signed)
Patient wanting to leave

## 2018-03-04 NOTE — ED Notes (Signed)
Patient putting on shoes

## 2018-03-04 NOTE — ED Triage Notes (Signed)
Ems called by pt for right side abd pain x 4 days. Per fam pt has been falling a lot. Pt also is blind and has a hx of etoh

## 2018-03-28 ENCOUNTER — Emergency Department (HOSPITAL_COMMUNITY): Payer: Medicare HMO

## 2018-03-28 ENCOUNTER — Encounter (HOSPITAL_COMMUNITY): Payer: Self-pay | Admitting: Emergency Medicine

## 2018-03-28 ENCOUNTER — Emergency Department (HOSPITAL_COMMUNITY)
Admission: EM | Admit: 2018-03-28 | Discharge: 2018-03-28 | Disposition: A | Discharge status: Home / Self Care | Payer: Medicare HMO | Attending: Emergency Medicine | Admitting: Emergency Medicine

## 2018-03-28 DIAGNOSIS — R079 Chest pain, unspecified: Secondary | ICD-10-CM | POA: Diagnosis not present

## 2018-03-28 DIAGNOSIS — E876 Hypokalemia: Secondary | ICD-10-CM | POA: Insufficient documentation

## 2018-03-28 DIAGNOSIS — M25511 Pain in right shoulder: Secondary | ICD-10-CM | POA: Insufficient documentation

## 2018-03-28 DIAGNOSIS — F17210 Nicotine dependence, cigarettes, uncomplicated: Secondary | ICD-10-CM | POA: Insufficient documentation

## 2018-03-28 DIAGNOSIS — M25512 Pain in left shoulder: Secondary | ICD-10-CM | POA: Diagnosis not present

## 2018-03-28 DIAGNOSIS — Z79899 Other long term (current) drug therapy: Secondary | ICD-10-CM | POA: Insufficient documentation

## 2018-03-28 DIAGNOSIS — I10 Essential (primary) hypertension: Secondary | ICD-10-CM | POA: Insufficient documentation

## 2018-03-28 LAB — CBC WITH DIFFERENTIAL/PLATELET
Abs Immature Granulocytes: 0.04 10*3/uL (ref 0.00–0.07)
BASOS ABS: 0.1 10*3/uL (ref 0.0–0.1)
Basophils Relative: 1 %
Eosinophils Absolute: 0.1 10*3/uL (ref 0.0–0.5)
Eosinophils Relative: 1 %
HCT: 43.6 % (ref 39.0–52.0)
Hemoglobin: 14 g/dL (ref 13.0–17.0)
IMMATURE GRANULOCYTES: 1 %
Lymphocytes Relative: 27 %
Lymphs Abs: 2.2 10*3/uL (ref 0.7–4.0)
MCH: 27.9 pg (ref 26.0–34.0)
MCHC: 32.1 g/dL (ref 30.0–36.0)
MCV: 87 fL (ref 80.0–100.0)
Monocytes Absolute: 0.6 10*3/uL (ref 0.1–1.0)
Monocytes Relative: 7 %
NRBC: 0 % (ref 0.0–0.2)
Neutro Abs: 5.1 10*3/uL (ref 1.7–7.7)
Neutrophils Relative %: 63 %
PLATELETS: 458 10*3/uL — AB (ref 150–400)
RBC: 5.01 MIL/uL (ref 4.22–5.81)
RDW: 17.2 % — AB (ref 11.5–15.5)
WBC: 8.1 10*3/uL (ref 4.0–10.5)

## 2018-03-28 LAB — BASIC METABOLIC PANEL
ANION GAP: 13 (ref 5–15)
BUN: 28 mg/dL — ABNORMAL HIGH (ref 8–23)
CO2: 26 mmol/L (ref 22–32)
CREATININE: 1.65 mg/dL — AB (ref 0.61–1.24)
Calcium: 9.5 mg/dL (ref 8.9–10.3)
Chloride: 101 mmol/L (ref 98–111)
GFR calc non Af Amer: 44 mL/min — ABNORMAL LOW (ref >60)
GFR, EST AFRICAN AMERICAN: 50 mL/min — AB (ref >60)
GLUCOSE: 102 mg/dL — AB (ref 70–99)
POTASSIUM: 2.9 mmol/L — AB (ref 3.5–5.1)
SODIUM: 140 mmol/L (ref 135–145)

## 2018-03-28 LAB — TROPONIN I: Troponin I: <0.03 ng/mL (ref <0.03)

## 2018-03-28 MED ORDER — KETOROLAC TROMETHAMINE 60 MG/2ML IM SOLN
60.0000 mg | Freq: Once | INTRAMUSCULAR | Status: AC
  Administered 2018-03-28: 60 mg via INTRAMUSCULAR
  Filled 2018-03-28: qty 2

## 2018-03-28 MED ORDER — POTASSIUM CHLORIDE CRYS ER 20 MEQ PO TBCR: 20.0000 meq | EXTENDED_RELEASE_TABLET | Freq: Two times a day (BID) | ORAL | 0 refills | Status: DC

## 2018-03-28 MED ORDER — TRAMADOL HCL 50 MG PO TABS: 50.0000 mg | ORAL_TABLET | Freq: Four times a day (QID) | ORAL | 0 refills | Status: DC | PRN

## 2018-03-28 NOTE — ED Provider Notes (Signed)
[facility] EMERGENCY DEPARTMENT Provider Note   CSN: [id number] Arrival date & time: 03/28/18  1810    History   Chief Complaint Chief Complaint  Patient presents with  . Chest Pain    HPI [name] is a 63 y.o. male.     Central chest pain for 3 weeks on and off.  Patient also complains of bilateral shoulder pain with range of motion.  No known history of cardiac disease.  Past medical history includes hypertension, hyperlipidemia, alcohol abuse, blindness.  No dyspnea, diaphoresis, nausea.  Symptoms are not associate with any activity.  Severity is mild to moderate.     Past Medical History:  Diagnosis Date  . Alcohol abuse   . Blind   . Hyperlipidemia   . Hypertension     Patient Active Problem List   Diagnosis Date Noted  . Alcohol abuse with intoxication ([place])   . Anemia due to vitamin B12 deficiency   . Right leg numbness 05/07/2017  . Alcohol abuse 05/07/2017  . Hypokalemia 05/07/2017  . 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  . OBESITY 01/08/2007  . Hyperlipidemia 12/25/2005  . TOBACCO ABUSE 12/25/2005  . BLINDNESS, [place], [place] DEFINITION 12/25/2005  . Essential hypertension 12/25/2005  . TRIGGER FINGER 12/25/2005    Past Surgical History:  Procedure Laterality Date  . CATARACT EXTRACTION W/PHACO Right 09/26/2014   Procedure: CATARACT EXTRACTION PHACO AND INTRAOCULAR LENS PLACEMENT RIGHT EYE CDE=18.56;  Surgeon: Tonny Branch, MD;  Location: [facility];  Service: Ophthalmology;  Laterality: Right;  . ROTATOR CUFF REPAIR Left 2012  . TOOTH EXTRACTION N/A 1996        Home Medications    Prior to Admission medications   Medication Sig Start Date End Date Taking? Authorizing Provider  amLODipine (NORVASC) 10 MG tablet Take 10 mg by mouth daily.   Yes [provider]  atorvastatin (LIPITOR) 10 MG tablet Take 10 mg by mouth daily.   Yes [provider]  valsartan-hydrochlorothiazide (DIOVAN-HCT) 160-25  MG per tablet Take 1 tablet by mouth daily.   Yes [provider]  Multiple Vitamin (MULTIVITAMIN WITH MINERALS) TABS tablet Take 1 tablet by mouth daily. 05/09/17   Barton Dubois, MD  naproxen (NAPROSYN) 500 MG tablet Take 1 tablet (500 mg total) by mouth 2 (two) times daily with a meal. 05/07/15   Triplett, Tammy, PA-C  potassium chloride SA (K-DUR,KLOR-CON) 20 MEQ tablet Take 1 tablet (20 mEq total) by mouth 2 (two) times daily. 03/28/18   Nat Christen, MD  traMADol (ULTRAM) 50 MG tablet Take 1 tablet (50 mg total) by mouth every 6 (six) hours as needed. 03/28/18   Nat Christen, MD  vitamin B-12 (CYANOCOBALAMIN) 1000 MCG tablet Take 1 tablet (1,000 mcg total) by mouth 2 (two) times daily. 05/08/17   Barton Dubois, MD    Family History History reviewed. No pertinent family history.  Social History Social History   Tobacco Use  . Smoking status: Current Every Day Smoker    Packs/day: 0.50    Years: 20.00    Pack years: 10.00    Types: Cigarettes  . Smokeless tobacco: Never Used  Substance Use Topics  . Alcohol use: Yes    Alcohol/week: 1.0 standard drinks    Types: 1 Glasses of wine per week  . Drug use: No     Allergies   Cortisone   Review of Systems Review of Systems  All other systems reviewed and are negative.    Physical Exam Updated  Vital Signs BP 90/70   Pulse 86   Temp 98.8 F (37.1 C) (Oral)   Resp 18   Ht 5\' 8"  (1.727 m)   Wt 90.7 kg   SpO2 98%   BMI 30.41 kg/m   Physical Exam Vitals signs and nursing note reviewed.  Constitutional:      Appearance: He is well-developed.  HENT:     Head: Normocephalic and atraumatic.     Comments: blind Eyes:     Conjunctiva/sclera: Conjunctivae normal.  Neck:     Musculoskeletal: Neck supple.  Cardiovascular:     Rate and Rhythm: Normal rate and regular rhythm.  Pulmonary:     Effort: Pulmonary effort is normal.     Breath sounds: Normal breath sounds.  Abdominal:     General: Bowel sounds are normal.      Palpations: Abdomen is soft.  Musculoskeletal:     Comments: Pain with range of motion of both shoulders.  Skin:    General: Skin is warm and dry.  Neurological:     Mental Status: He is alert and oriented to person, place, and time.  Psychiatric:        Behavior: Behavior normal.      ED Treatments / Results  Labs (all labs ordered are listed, but only abnormal results are displayed) Labs Reviewed  CBC WITH DIFFERENTIAL/PLATELET - Abnormal; Notable for the following components:      Result Value   RDW 17.2 (*)    Platelets 458 (*)    All other components within normal limits  BASIC METABOLIC PANEL - Abnormal; Notable for the following components:   Potassium 2.9 (*)    Glucose, Bld 102 (*)    BUN 28 (*)    Creatinine, Ser 1.65 (*)    GFR calc non Af Amer 44 (*)    GFR calc Af Amer 50 (*)    All other components within normal limits  TROPONIN I    EKG EKG Interpretation  Date/Time:  Saturday March 28 2018 18:27:08 EST Ventricular Rate:  79 PR Interval:    QRS Duration: 99 QT Interval:  483 QTC Calculation: 554 R Axis:   76 Text Interpretation:  Sinus rhythm Borderline ST elevation, anterior leads Prolonged QT interval Confirmed by Nat Christen [phone]) on 03/28/2018 7:20:47 PM   Radiology Dg Chest 2 View  Result Date: 03/28/2018 CLINICAL DATA:  Chest and bilateral shoulder pain for 1 week. EXAM: CHEST - 2 VIEW COMPARISON:  PA and lateral chest 05/08/2017 and 12/10/2013. FINDINGS: Lungs clear. Heart size normal. Aortic atherosclerosis noted. No pneumothorax or pleural fluid. No acute or focal bony abnormality. IMPRESSION: No acute disease. Atherosclerosis. Electronically Signed   By: Inge Rise M.D.   On: 03/28/2018 19:56    Procedures Procedures (including critical care time)  Medications Ordered in ED Medications  ketorolac (TORADOL) injection 60 mg (60 mg Intramuscular Given 03/28/18 2055)     Initial Impression / Assessment and Plan / ED  Course  I have reviewed the triage vital signs and the nursing notes.  Pertinent labs & imaging results that were available during my care of the patient were reviewed by me and considered in my medical decision making (see chart for details).        Patient presents with chest pain for 3 weeks, bilateral shoulder pain with range of motion. EKG and chest x-ray negative.  Cardiac enzymes negative.  Potassium 2.9.  Creatinine elevated at 1.65 (not new).  Patient is hemodynamically stable discharge.  Will refer to orthopedics for his bilateral shoulder pain.  Replace potassium.  Tramadol for pain.  He will also follow-up with his primary care doctor.  Final Clinical Impressions(s) / ED Diagnoses   Final diagnoses:  Chest pain, unspecified type  Hypokalemia  Bilateral shoulder pain, unspecified chronicity    ED Discharge Orders         Ordered    potassium chloride SA (K-DUR,KLOR-CON) 20 MEQ tablet  2 times daily     03/28/18 2155    traMADol (ULTRAM) 50 MG tablet  Every 6 hours PRN     03/28/18 2155           Nat Christen, MD 03/28/18 2216

## 2018-03-28 NOTE — Discharge Instructions (Addendum)
Test showed no evidence of heart attack.  Recommend seeing an orthopedic surgeon for your shoulder.  Phone number given.  Potassium is low.  Prescription for potassium medicine and pain medicine.  Can also follow-up with primary care doctor.

## 2018-03-28 NOTE — ED Triage Notes (Signed)
Pt reports he has been having bilateral shoulder pain, CP, and nasal congestion for a week. Denies seeing anyone about this. Pt is eating upon being called for triage. Pt is legally blind.

## 2018-03-28 NOTE — ED Notes (Signed)
Pt refusing EKG, advised pt the need to rule out cardiac causes. Pt repeatedly stating "I ONLY want x-rays".

## 2018-03-28 NOTE — ED Notes (Signed)
Dr Cook at bedside,  

## 2018-03-28 NOTE — ED Notes (Signed)
Pt called out to nursing desk, states " I need to get this stuff going, I need something for pain", RN asked pt about pain, states that he is hurting left rib cage area, rates at a 8 on pain scale, pt requesting to see Dr Lacinda Axon, does not understand why he is waiting, RN explained to pt multiple times reason for blood and reason for waiting for blood work,

## 2018-03-28 NOTE — ED Notes (Signed)
Left sided chest pain that hurts worse with movement. Does NOT want blood work. Only wants an EKG and Xrays

## 2018-03-28 NOTE — ED Notes (Signed)
ED Provider at bedside. 

## 2018-03-28 NOTE — ED Notes (Signed)
Patient transported to X-ray 

## 2018-04-14 DIAGNOSIS — E782 Mixed hyperlipidemia: Secondary | ICD-10-CM | POA: Diagnosis not present

## 2018-04-14 DIAGNOSIS — Z720 Tobacco use: Secondary | ICD-10-CM | POA: Diagnosis not present

## 2018-04-14 DIAGNOSIS — I10 Essential (primary) hypertension: Secondary | ICD-10-CM | POA: Diagnosis not present

## 2018-04-14 DIAGNOSIS — E785 Hyperlipidemia, unspecified: Secondary | ICD-10-CM | POA: Diagnosis not present

## 2018-04-14 DIAGNOSIS — M25511 Pain in right shoulder: Secondary | ICD-10-CM | POA: Diagnosis not present

## 2018-04-14 DIAGNOSIS — H548 Legal blindness, as defined in USA: Secondary | ICD-10-CM | POA: Diagnosis not present

## 2018-04-14 DIAGNOSIS — Z125 Encounter for screening for malignant neoplasm of prostate: Secondary | ICD-10-CM | POA: Diagnosis not present

## 2018-04-14 DIAGNOSIS — E079 Disorder of thyroid, unspecified: Secondary | ICD-10-CM | POA: Diagnosis not present

## 2018-09-08 DIAGNOSIS — I10 Essential (primary) hypertension: Secondary | ICD-10-CM | POA: Diagnosis not present

## 2018-09-08 DIAGNOSIS — H548 Legal blindness, as defined in USA: Secondary | ICD-10-CM | POA: Diagnosis not present

## 2018-09-08 DIAGNOSIS — E785 Hyperlipidemia, unspecified: Secondary | ICD-10-CM | POA: Diagnosis not present

## 2018-09-08 DIAGNOSIS — Z7189 Other specified counseling: Secondary | ICD-10-CM | POA: Diagnosis not present

## 2019-04-05 DIAGNOSIS — F5221 Male erectile disorder: Secondary | ICD-10-CM | POA: Diagnosis not present

## 2019-04-05 DIAGNOSIS — Z7189 Other specified counseling: Secondary | ICD-10-CM | POA: Diagnosis not present

## 2019-04-05 DIAGNOSIS — I10 Essential (primary) hypertension: Secondary | ICD-10-CM | POA: Diagnosis not present

## 2019-04-05 DIAGNOSIS — E785 Hyperlipidemia, unspecified: Secondary | ICD-10-CM | POA: Diagnosis not present

## 2019-04-05 DIAGNOSIS — Z125 Encounter for screening for malignant neoplasm of prostate: Secondary | ICD-10-CM | POA: Diagnosis not present

## 2019-04-05 DIAGNOSIS — H548 Legal blindness, as defined in USA: Secondary | ICD-10-CM | POA: Diagnosis not present

## 2019-04-05 DIAGNOSIS — E789 Disorder of lipoprotein metabolism, unspecified: Secondary | ICD-10-CM | POA: Diagnosis not present

## 2019-06-04 DIAGNOSIS — I10 Essential (primary) hypertension: Secondary | ICD-10-CM | POA: Diagnosis not present

## 2019-06-04 DIAGNOSIS — E7849 Other hyperlipidemia: Secondary | ICD-10-CM | POA: Diagnosis not present

## 2019-10-31 ENCOUNTER — Other Ambulatory Visit: Payer: Self-pay

## 2019-10-31 ENCOUNTER — Encounter: Payer: Self-pay | Admitting: Emergency Medicine

## 2019-10-31 ENCOUNTER — Ambulatory Visit
Admission: EM | Admit: 2019-10-31 | Discharge: 2019-10-31 | Disposition: A | Discharge status: Home / Self Care | Payer: Medicare HMO | Attending: Emergency Medicine | Admitting: Emergency Medicine

## 2019-10-31 ENCOUNTER — Ambulatory Visit (INDEPENDENT_AMBULATORY_CARE_PROVIDER_SITE_OTHER): Payer: Worker's Compensation

## 2019-10-31 DIAGNOSIS — S298XXA Other specified injuries of thorax, initial encounter: Secondary | ICD-10-CM

## 2019-10-31 DIAGNOSIS — R0781 Pleurodynia: Secondary | ICD-10-CM

## 2019-10-31 DIAGNOSIS — S2242XA Multiple fractures of ribs, left side, initial encounter for closed fracture: Secondary | ICD-10-CM | POA: Insufficient documentation

## 2019-10-31 DIAGNOSIS — Z79899 Other long term (current) drug therapy: Secondary | ICD-10-CM | POA: Diagnosis not present

## 2019-10-31 DIAGNOSIS — F17210 Nicotine dependence, cigarettes, uncomplicated: Secondary | ICD-10-CM | POA: Diagnosis not present

## 2019-10-31 LAB — POCT URINALYSIS DIP (MANUAL ENTRY)
Bilirubin, UA: NEGATIVE
Blood, UA: NEGATIVE
Glucose, UA: NEGATIVE mg/dL
Ketones, POC UA: NEGATIVE mg/dL
Leukocytes, UA: NEGATIVE
Nitrite, UA: NEGATIVE
Protein Ur, POC: NEGATIVE mg/dL
Spec Grav, UA: 1.015 (ref 1.010–1.025)
Urobilinogen, UA: 0.2 E.U./dL
pH, UA: 5.5 (ref 5.0–8.0)

## 2019-10-31 NOTE — ED Provider Notes (Signed)
[name]   [id number] 10/31/19 Arrival Time: 14   CC: Rib cage / flank pain   SUBJECTIVE:  [name] is a 64 y.o. male who presented to the urgent care for complaint of left-sided rib cage pain for the past 4 to 5 days.  Denies any precipitating event, trauma, injury.  Has tried OTC medications without relief.  Denies alleviating or aggravating factors.  Denies similar symptoms in the past.  Denies chills, fever, nausea, vomiting, diarrhea   No LMP for male patient.  ROS: As per HPI.  All other pertinent ROS negative.     Past Medical History:  Diagnosis Date  . Alcohol abuse   . Blind   . Hyperlipidemia   . Hypertension    Past Surgical History:  Procedure Laterality Date  . CATARACT EXTRACTION W/PHACO Right 09/26/2014   Procedure: CATARACT EXTRACTION PHACO AND INTRAOCULAR LENS PLACEMENT RIGHT EYE CDE=18.56;  Surgeon: Tonny Branch, MD;  Location: [facility];  Service: Ophthalmology;  Laterality: Right;  . ROTATOR CUFF REPAIR Left 2012  . TOOTH EXTRACTION N/A 1996   Allergies  Allergen Reactions  . Cortisone     CORTISONE INJECTION MADE HIM FEEL LIKE SOMETHING WAS CRAWLING ALL OVER HIM.    No current facility-administered medications on file prior to encounter.   Current Outpatient Medications on File Prior to Encounter  Medication Sig Dispense Refill  . amLODipine (NORVASC) 10 MG tablet Take 10 mg by mouth daily.    Marland Kitchen atorvastatin (LIPITOR) 10 MG tablet Take 10 mg by mouth daily.    . Multiple Vitamin (MULTIVITAMIN WITH MINERALS) TABS tablet Take 1 tablet by mouth daily. 30 tablet 1  . naproxen (NAPROSYN) 500 MG tablet Take 1 tablet (500 mg total) by mouth 2 (two) times daily with a meal. 20 tablet 0  . potassium chloride SA (K-DUR,KLOR-CON) 20 MEQ tablet Take 1 tablet (20 mEq total) by mouth 2 (two) times daily. 10 tablet 0  . traMADol (ULTRAM) 50 MG tablet Take 1 tablet (50 mg total) by mouth every 6 (six) hours as needed. 20 tablet 0  .  valsartan-hydrochlorothiazide (DIOVAN-HCT) 160-25 MG per tablet Take 1 tablet by mouth daily.    . vitamin B-12 (CYANOCOBALAMIN) 1000 MCG tablet Take 1 tablet (1,000 mcg total) by mouth 2 (two) times daily. 60 tablet 3   Social History   Socioeconomic History  . Marital status: Single    Spouse name: Not on file  . Number of children: Not on file  . Years of education: Not on file  . Highest education level: Not on file  Occupational History  . Not on file  Tobacco Use  . Smoking status: Current Every Day Smoker    Packs/day: 0.50    Years: 20.00    Pack years: 10.00    Types: Cigarettes  . Smokeless tobacco: Never Used  Substance and Sexual Activity  . Alcohol use: Yes    Alcohol/week: 1.0 standard drink    Types: 1 Glasses of wine per week  . Drug use: No  . Sexual activity: Not on file  Other Topics Concern  . Not on file  Social History Narrative  . Not on file   Social Determinants of Health   Financial Resource Strain:   . Difficulty of Paying Living Expenses: Not on file  Food Insecurity:   . Worried About Charity fundraiser in the Last Year: Not on file  . Ran Out of Food in the Last Year: Not on  file  Transportation Needs:   . Film/video editor (Medical): Not on file  . Lack of Transportation (Non-Medical): Not on file  Physical Activity:   . Days of Exercise per Week: Not on file  . Minutes of Exercise per Session: Not on file  Stress:   . Feeling of Stress : Not on file  Social Connections:   . Frequency of Communication with Friends and Family: Not on file  . Frequency of Social Gatherings with Friends and Family: Not on file  . Attends Religious Services: Not on file  . Active Member of Clubs or Organizations: Not on file  . Attends Archivist Meetings: Not on file  . Marital Status: Not on file  Intimate Partner Violence:   . Fear of Current or Ex-Partner: Not on file  . Emotionally Abused: Not on file  . Physically Abused: Not on  file  . Sexually Abused: Not on file   No family history on file.   OBJECTIVE:  Vitals:   10/31/19 1504  BP: 129/79  Pulse: 65  Resp: 16  Temp: 98.3 F (36.8 C)  SpO2: 97%    Physical Exam Vitals and nursing note reviewed.  Constitutional:      General: He is not in acute distress.    Appearance: Normal appearance. He is normal weight. He is not ill-appearing, toxic-appearing or diaphoretic.  Cardiovascular:     Rate and Rhythm: Normal rate and regular rhythm.     Pulses: Normal pulses.     Heart sounds: Normal heart sounds. No murmur heard.  No friction rub. No gallop.   Pulmonary:     Effort: Pulmonary effort is normal. No respiratory distress.     Breath sounds: Normal breath sounds. No stridor. No wheezing, rhonchi or rales.  Chest:     Chest wall: No tenderness.  Abdominal:     General: There is no distension.     Palpations: There is no mass.     Tenderness: There is no abdominal tenderness. There is no right CVA tenderness, left CVA tenderness, guarding or rebound.     Hernia: No hernia is present.  Musculoskeletal:        General: Tenderness present.     Comments: Left-sided rib cage pain on palpation.  No ecchymosis, warmth, open wound or lesion present.  Pain with deep breathing.  Neurological:     Mental Status: He is alert and oriented to person, place, and time.     LABS: Results for orders placed or performed during the hospital encounter of 10/31/19 (from the past 24 hour(s))  POCT urinalysis dipstick     Status: None   Collection Time: 10/31/19  4:03 PM  Result Value Ref Range   Color, UA yellow yellow   Clarity, UA clear clear   Glucose, UA negative negative mg/dL   Bilirubin, UA negative negative   Ketones, POC UA negative negative mg/dL   Spec Grav, UA 1.015 1.010 - 1.025   Blood, UA negative negative   pH, UA 5.5 5.0 - 8.0   Protein Ur, POC negative negative mg/dL   Urobilinogen, UA 0.2 0.2 or 1.0 E.U./dL   Nitrite, UA Negative Negative     Leukocytes, UA Negative Negative    DIAGNOSTIC STUDIES: DG Ribs Unilateral W/Chest Left  Result Date: 10/31/2019 CLINICAL DATA:  Fall several days ago with left rib pain, initial encounter EXAM: LEFT RIBS AND CHEST - 3+ VIEW COMPARISON:  03/28/2018 FINDINGS: Cardiac shadow is within normal limits. Aortic  calcifications are again seen. Lungs are well aerated without focal infiltrate or pneumothorax. Mildly displaced left fifth rib fracture is noted posterolaterally postsurgical changes in the left shoulder are noted. No other fractures are noted. IMPRESSION: Left fifth rib fracture without complicating factors. Electronically Signed   By: Inez Catalina M.D.   On: 10/31/2019 16:06     RADIOLOGY:  DG Ribs Unilateral W/Chest Left  Result Date: 10/31/2019 CLINICAL DATA:  Fall several days ago with left rib pain, initial encounter EXAM: LEFT RIBS AND CHEST - 3+ VIEW COMPARISON:  03/28/2018 FINDINGS: Cardiac shadow is within normal limits. Aortic calcifications are again seen. Lungs are well aerated without focal infiltrate or pneumothorax. Mildly displaced left fifth rib fracture is noted posterolaterally postsurgical changes in the left shoulder are noted. No other fractures are noted. IMPRESSION: Left fifth rib fracture without complicating factors. Electronically Signed   By: Inez Catalina M.D.   On: 10/31/2019 16:06   X-ray is positive for fracture of the left fifth rib.  I have reviewed the x-ray myself and the radiologist interpretation.  I am in agreement with the radiologist interpretation.   ASSESSMENT & PLAN:  1. Rib pain on left side   2. Closed fracture of five ribs of left side, initial encounter     No orders of the defined types were placed in this encounter.  Discharge instructions  Take OTC Tylenol as needed for pain every 4 hours Follow-up with PCP Follow RICE instruction as attached Return to to ED for worsening of symptoms  If you experience new or worsening symptoms  return or go to ER such as fever, chills, nausea, vomiting, diarrhea, bloody or dark tarry stools, constipation, urinary symptoms, worsening abdominal discomfort, symptoms that do not improve with medications, inability to keep fluids down, etc...  Reviewed expectations re: course of current medical issues. Questions answered. Outlined signs and symptoms indicating need for more acute intervention. Patient verbalized understanding. After Visit Summary given.   Emerson Monte, [place] 10/31/19 1651

## 2019-10-31 NOTE — ED Triage Notes (Signed)
Patient is blind- Patient states that he has upper left side rib pain that radiates around to his chest x 4-5 days, some light coughing, some allergy congestion.

## 2019-10-31 NOTE — Discharge Instructions (Addendum)
Take OTC Tylenol as needed for pain every 4 hours Follow-up with PCP Follow RICE instruction as attached Return to to ED for worsening of symptoms

## 2019-11-02 LAB — URINE CULTURE: Culture: <10000 — AB

## 2019-12-18 DIAGNOSIS — I10 Essential (primary) hypertension: Secondary | ICD-10-CM | POA: Diagnosis not present

## 2019-12-18 DIAGNOSIS — F5221 Male erectile disorder: Secondary | ICD-10-CM | POA: Diagnosis not present

## 2019-12-18 DIAGNOSIS — E785 Hyperlipidemia, unspecified: Secondary | ICD-10-CM | POA: Diagnosis not present

## 2019-12-20 DIAGNOSIS — I10 Essential (primary) hypertension: Secondary | ICD-10-CM | POA: Diagnosis not present

## 2019-12-20 DIAGNOSIS — E785 Hyperlipidemia, unspecified: Secondary | ICD-10-CM | POA: Diagnosis not present

## 2020-01-28 DIAGNOSIS — S29011A Strain of muscle and tendon of front wall of thorax, initial encounter: Secondary | ICD-10-CM | POA: Diagnosis not present

## 2020-01-28 DIAGNOSIS — R0789 Other chest pain: Secondary | ICD-10-CM | POA: Diagnosis not present

## 2020-04-13 ENCOUNTER — Encounter: Payer: Self-pay | Admitting: Internal Medicine

## 2020-05-24 NOTE — Progress Notes (Addendum)
Referring Provider: Iona Beard, MD Primary Care Physician:  Iona Beard, MD Primary Gastroenterologist:  Dr. Abbey Chatters  Chief Complaint  Patient presents with  . Colonoscopy    Never had tcs. No fhcrc    HPI:   [name] is a 65 y.o. male presenting today at the request of Iona Beard, MD for consult colonoscopy.  Office visit due to chronic alcohol use.  Denies abdominal pain, constipation, diarrhea, no known blood in the stool but he is blind. Denies nausea, vomiting, acid reflux or trouble swallowing.  No unintentional weight loss.  Used to drink alcohol heavily on the weekend. Last drank years ago.   Past Medical History:  Diagnosis Date  . Alcohol abuse   . Blind   . Hyperlipidemia   . Hypertension     Past Surgical History:  Procedure Laterality Date  . CATARACT EXTRACTION W/PHACO Right 09/26/2014   Procedure: CATARACT EXTRACTION PHACO AND INTRAOCULAR LENS PLACEMENT RIGHT EYE CDE=18.56;  Surgeon: Tonny Branch, MD;  Location: [facility];  Service: Ophthalmology;  Laterality: Right;  . ROTATOR CUFF REPAIR Left 2012  . TOOTH EXTRACTION N/A 1996    Current Outpatient Medications  Medication Sig Dispense Refill  . amLODipine (NORVASC) 10 MG tablet Take 10 mg by mouth daily.    Marland Kitchen aspirin 81 MG chewable tablet Chew by mouth daily.    Marland Kitchen atorvastatin (LIPITOR) 10 MG tablet Take 10 mg by mouth daily.    . valsartan-hydrochlorothiazide (DIOVAN-HCT) 160-25 MG per tablet Take 1 tablet by mouth daily.     No current facility-administered medications for this visit.    Allergies as of 05/25/2020 - Review Complete 05/25/2020  Allergen Reaction Noted  . Cortisone  08/05/2012    Family History  Problem Relation Age of Onset  . Colon cancer Neg Hx     Social History   Socioeconomic History  . Marital status: Single    Spouse name: Not on file  . Number of children: Not on file  . Years of education: Not on file  . Highest education level: Not on file  Occupational  History  . Not on file  Tobacco Use  . Smoking status: Current Every Day Smoker    Packs/day: 0.50    Years: 20.00    Pack years: 10.00    Types: Cigarettes  . Smokeless tobacco: Never Used  Substance and Sexual Activity  . Alcohol use: Not Currently    Alcohol/week: 1.0 standard drink    Types: 1 Glasses of wine per week    Comment: used to drink heavily. Last drank years ago.   . Drug use: No  . Sexual activity: Not on file  Other Topics Concern  . Not on file  Social History Narrative  . Not on file   Social Determinants of Health   Financial Resource Strain: Not on file  Food Insecurity: Not on file  Transportation Needs: Not on file  Physical Activity: Not on file  Stress: Not on file  Social Connections: Not on file  Intimate Partner Violence: Not on file    Review of Systems: Gen: Denies any fever, chills.  Recent head cold, resolving, but with residual intermittent cough. CV: Denies chest pain or palpitations. Resp: Denies shortness of breath or cough GI: See HPI GU : Denies urinary burning, urinary frequency, urinary hesitancy MS: Denies joint pain Derm: Denies rash Psych: Denies depression or anxiety Heme: See HPI  Physical Exam: BP 133/82   Pulse 92   Temp Marland Kitchen)  97.3 F (36.3 C) (Temporal)   Ht 5' 8"  (1.727 m)   Wt 214 lb (97.1 kg)   BMI 32.54 kg/m  General: Alert and oriented. Pleasant and cooperative. Well-nourished and well-developed.  Head:  Normocephalic and atraumatic. Eyes:  Without icterus, sclera clear and conjunctiva pink.  Ears:  Normal auditory acuity. Lungs:  Clear to auscultation bilaterally. No wheezes, rales, or rhonchi. No distress.  Heart:  S1, S2 present without murmurs appreciated.  Abdomen:  +BS, soft, non-tender and non-distended. No HSM noted. No guarding or rebound. No masses appreciated.  Rectal:  Deferred  Msk:  Symmetrical without gross deformities. Normal posture. Extremities:  Without edema. Neurologic:  Alert and   oriented x4;  grossly normal neurologically. Skin:  Intact without significant lesions or rashes. Psych: Normal mood and affect.   Labs: 12/20/19: CMP: Glucose 87, creatinine 1.4, sodium 139, potassium 4.9, calcium 9.5, albumin 4.3, total bilirubin 0.3, alk phos 108, AST 30, ALT 22 Lipid panel: Total cholesterol 131, HDL 40, LDL 72, triglycerides 102    Assessment: 65 year old male with history of HTN, HLD, blindness presenting today to schedule first-ever screening colonoscopy.  No significant upper or lower GI symptoms.  Due to blindness, unable to assess food BRBPR or melena.  No unintentional weight loss.  No family history of colon cancer.   Plan: 1.  Proceed with colonoscopy with propofol with Dr. Abbey Chatters in the near future. The risks, benefits, and alternatives have been discussed with the patient in detail. The patient states understanding and desires to proceed.  ASA II  2.  Follow-up as needed.    Aliene Altes, PA-C [facility] 05/25/2020

## 2020-05-25 ENCOUNTER — Ambulatory Visit: Payer: Medicare HMO | Admitting: Gastroenterology

## 2020-05-25 ENCOUNTER — Encounter: Payer: Self-pay | Admitting: *Deleted

## 2020-05-25 ENCOUNTER — Other Ambulatory Visit: Payer: Self-pay

## 2020-05-25 ENCOUNTER — Encounter: Payer: Self-pay | Admitting: Gastroenterology

## 2020-05-25 ENCOUNTER — Telehealth: Payer: Self-pay | Admitting: *Deleted

## 2020-05-25 VITALS — BP 133/82 | HR 92 | Temp 97.3°F | Ht 68.0 in | Wt 214.0 lb

## 2020-05-25 DIAGNOSIS — Z01818 Encounter for other preprocedural examination: Secondary | ICD-10-CM

## 2020-05-25 DIAGNOSIS — Z1211 Encounter for screening for malignant neoplasm of colon: Secondary | ICD-10-CM | POA: Diagnosis not present

## 2020-05-25 MED ORDER — PEG 3350-KCL-NA BICARB-NACL 420 G PO SOLR: ORAL | 0 refills | Status: DC

## 2020-05-25 NOTE — Telephone Encounter (Signed)
PA for Colonoscopy approved via [employer] website Auth# [id number] DOS 07/07/2020-08/06/2020

## 2020-05-25 NOTE — Patient Instructions (Signed)
We will arrange for you to have a colonoscopy in the near future with Dr. Abbey Chatters.  We will see back as needed.  Do not hesitate to call if you have any new GI concerns.   It was great to meet you today!   Aliene Altes, PA-C [facility]

## 2020-07-05 ENCOUNTER — Other Ambulatory Visit (HOSPITAL_COMMUNITY)
Admission: RE | Admit: 2020-07-05 | Discharge: 2020-07-05 | Disposition: A | Discharge status: Home / Self Care | Payer: Medicare HMO | Source: Ambulatory Visit | Attending: Internal Medicine | Admitting: Internal Medicine

## 2020-07-05 ENCOUNTER — Other Ambulatory Visit: Payer: Self-pay

## 2020-07-05 DIAGNOSIS — Z79899 Other long term (current) drug therapy: Secondary | ICD-10-CM | POA: Diagnosis not present

## 2020-07-05 DIAGNOSIS — D120 Benign neoplasm of cecum: Secondary | ICD-10-CM | POA: Diagnosis not present

## 2020-07-05 DIAGNOSIS — Z7982 Long term (current) use of aspirin: Secondary | ICD-10-CM | POA: Diagnosis not present

## 2020-07-05 DIAGNOSIS — Z888 Allergy status to other drugs, medicaments and biological substances status: Secondary | ICD-10-CM | POA: Diagnosis not present

## 2020-07-05 DIAGNOSIS — Z1211 Encounter for screening for malignant neoplasm of colon: Secondary | ICD-10-CM | POA: Diagnosis not present

## 2020-07-05 DIAGNOSIS — F17210 Nicotine dependence, cigarettes, uncomplicated: Secondary | ICD-10-CM | POA: Diagnosis not present

## 2020-07-05 DIAGNOSIS — D123 Benign neoplasm of transverse colon: Secondary | ICD-10-CM | POA: Diagnosis not present

## 2020-07-05 LAB — BASIC METABOLIC PANEL
Anion gap: 7 (ref 5–15)
BUN: 23 mg/dL (ref 8–23)
CO2: 29 mmol/L (ref 22–32)
Calcium: 9.1 mg/dL (ref 8.9–10.3)
Chloride: 103 mmol/L (ref 98–111)
Creatinine, Ser: 1.58 mg/dL — ABNORMAL HIGH (ref 0.61–1.24)
GFR, Estimated: 48 mL/min — ABNORMAL LOW (ref >60)
Glucose, Bld: 105 mg/dL — ABNORMAL HIGH (ref 70–99)
Potassium: 3.3 mmol/L — ABNORMAL LOW (ref 3.5–5.1)
Sodium: 139 mmol/L (ref 135–145)

## 2020-07-07 ENCOUNTER — Ambulatory Visit (HOSPITAL_COMMUNITY): Payer: Medicare HMO | Admitting: Anesthesiology

## 2020-07-07 ENCOUNTER — Encounter (HOSPITAL_COMMUNITY): Payer: Self-pay

## 2020-07-07 ENCOUNTER — Ambulatory Visit (HOSPITAL_COMMUNITY)
Admission: RE | Admit: 2020-07-07 | Discharge: 2020-07-07 | Disposition: A | Discharge status: Home / Self Care | Payer: Medicare HMO | Attending: Internal Medicine | Admitting: Internal Medicine

## 2020-07-07 ENCOUNTER — Other Ambulatory Visit: Payer: Self-pay

## 2020-07-07 ENCOUNTER — Encounter (HOSPITAL_COMMUNITY)
Admission: RE | Disposition: A | Discharge status: Home / Self Care | Payer: Self-pay | Source: Home / Self Care | Attending: Internal Medicine

## 2020-07-07 DIAGNOSIS — K635 Polyp of colon: Secondary | ICD-10-CM | POA: Diagnosis not present

## 2020-07-07 DIAGNOSIS — D120 Benign neoplasm of cecum: Secondary | ICD-10-CM | POA: Diagnosis not present

## 2020-07-07 DIAGNOSIS — F17210 Nicotine dependence, cigarettes, uncomplicated: Secondary | ICD-10-CM | POA: Insufficient documentation

## 2020-07-07 DIAGNOSIS — D123 Benign neoplasm of transverse colon: Secondary | ICD-10-CM | POA: Diagnosis not present

## 2020-07-07 DIAGNOSIS — Z1211 Encounter for screening for malignant neoplasm of colon: Secondary | ICD-10-CM | POA: Diagnosis not present

## 2020-07-07 DIAGNOSIS — Z888 Allergy status to other drugs, medicaments and biological substances status: Secondary | ICD-10-CM | POA: Insufficient documentation

## 2020-07-07 DIAGNOSIS — Z79899 Other long term (current) drug therapy: Secondary | ICD-10-CM | POA: Insufficient documentation

## 2020-07-07 DIAGNOSIS — Z7982 Long term (current) use of aspirin: Secondary | ICD-10-CM | POA: Diagnosis not present

## 2020-07-07 HISTORY — PX: COLONOSCOPY WITH PROPOFOL: SHX5780

## 2020-07-07 HISTORY — PX: POLYPECTOMY: SHX5525

## 2020-07-07 SURGERY — COLONOSCOPY WITH PROPOFOL
Anesthesia: General

## 2020-07-07 MED ORDER — LACTATED RINGERS IV SOLN: INTRAVENOUS | Status: DC

## 2020-07-07 MED ORDER — PROPOFOL 10 MG/ML IV BOLUS
INTRAVENOUS | Status: DC | PRN
  Administered 2020-07-07 (×3): 30 mg via INTRAVENOUS
  Administered 2020-07-07: 20 mg via INTRAVENOUS
  Administered 2020-07-07: 40 mg via INTRAVENOUS
  Administered 2020-07-07: 70 mg via INTRAVENOUS
  Administered 2020-07-07: 10 mg via INTRAVENOUS

## 2020-07-07 MED ORDER — LIDOCAINE HCL (CARDIAC) PF 100 MG/5ML IV SOSY
PREFILLED_SYRINGE | INTRAVENOUS | Status: DC | PRN
  Administered 2020-07-07: 40 mg via INTRATRACHEAL

## 2020-07-07 NOTE — Anesthesia Preprocedure Evaluation (Signed)
Anesthesia Evaluation  Patient identified by MRN, date of birth, ID band Patient awake    Reviewed: Allergy & Precautions, H&P , NPO status , Patient's Chart, lab work & pertinent test results, reviewed documented beta blocker date and time   Airway Mallampati: II  TM Distance: >3 FB Neck ROM: full    Dental no notable dental hx.    Pulmonary neg pulmonary ROS, Current Smoker,    Pulmonary exam normal breath sounds clear to auscultation       Cardiovascular Exercise Tolerance: Good hypertension, negative cardio ROS   Rhythm:regular Rate:Normal     Neuro/Psych PSYCHIATRIC DISORDERS negative neurological ROS     GI/Hepatic negative GI ROS, Neg liver ROS,   Endo/Other  negative endocrine ROS  Renal/GU negative Renal ROS  negative genitourinary   Musculoskeletal   Abdominal   Peds  Hematology  (+) Blood dyscrasia, anemia ,   Anesthesia Other Findings   Reproductive/Obstetrics negative OB ROS                             Anesthesia Physical Anesthesia Plan  ASA: II  Anesthesia Plan: General   Post-op Pain Management:    Induction:   PONV Risk Score and Plan: Propofol infusion  Airway Management Planned:   Additional Equipment:   Intra-op Plan:   Post-operative Plan:   Informed Consent: I have reviewed the patients History and Physical, chart, labs and discussed the procedure including the risks, benefits and alternatives for the proposed anesthesia with the patient or authorized representative who has indicated his/her understanding and acceptance.     Dental Advisory Given  Plan Discussed with: CRNA  Anesthesia Plan Comments:         Anesthesia Quick Evaluation

## 2020-07-07 NOTE — Transfer of Care (Signed)
Immediate Anesthesia Transfer of Care Note  Patient: [name]  Procedure(s) Performed: COLONOSCOPY WITH PROPOFOL (N/A ) POLYPECTOMY  Patient Location: Endoscopy Unit  Anesthesia Type:MAC  Level of Consciousness: awake, alert , oriented and patient cooperative  Airway & Oxygen Therapy: Patient Spontanous Breathing and Patient connected to nasal cannula oxygen  Post-op Assessment: Report given to RN, Post -op Vital signs reviewed and stable and Patient moving all extremities  Post vital signs: Reviewed and stable  Last Vitals:  Vitals Value Taken Time  BP    Temp    Pulse    Resp    SpO2      Last Pain:  Vitals:   07/07/20 0923  TempSrc:   PainSc: 0-No pain      Patients Stated Pain Goal: 8 (14/99/69 2493)  Complications: No complications documented.

## 2020-07-07 NOTE — Discharge Instructions (Addendum)
Colonoscopy Discharge Instructions  Read the instructions outlined below and refer to this sheet in the next few weeks. These discharge instructions provide you with general information on caring for yourself after you leave the hospital. Your doctor may also give you specific instructions. While your treatment has been planned according to the most current medical practices available, unavoidable complications occasionally occur.   ACTIVITY  You may resume your regular activity, but move at a slower pace for the next 24 hours.   Take frequent rest periods for the next 24 hours.   Walking will help get rid of the air and reduce the bloated feeling in your belly (abdomen).   No driving for 24 hours (because of the medicine (anesthesia) used during the test).    Do not sign any important legal documents or operate any machinery for 24 hours (because of the anesthesia used during the test).  NUTRITION  Drink plenty of fluids.   You may resume your normal diet as instructed by your doctor.   Begin with a light meal and progress to your normal diet. Heavy or fried foods are harder to digest and may make you feel sick to your stomach (nauseated).   Avoid alcoholic beverages for 24 hours or as instructed.  MEDICATIONS  You may resume your normal medications unless your doctor tells you otherwise.  WHAT YOU CAN EXPECT TODAY  Some feelings of bloating in the abdomen.   Passage of more gas than usual.   Spotting of blood in your stool or on the toilet paper.  IF YOU HAD POLYPS REMOVED DURING THE COLONOSCOPY:  No aspirin products for 7 days or as instructed.   No alcohol for 7 days or as instructed.   Eat a soft diet for the next 24 hours.  FINDING OUT THE RESULTS OF YOUR TEST Not all test results are available during your visit. If your test results are not back during the visit, make an appointment with your caregiver to find out the results. Do not assume everything is normal if  you have not heard from your caregiver or the medical facility. It is important for you to follow up on all of your test results.  SEEK IMMEDIATE MEDICAL ATTENTION IF:  You have more than a spotting of blood in your stool.   Your belly is swollen (abdominal distention).   You are nauseated or vomiting.   You have a temperature over 101.   You have abdominal pain or discomfort that is severe or gets worse throughout the day.   Your colonoscopy revealed 5 polyp(s) which I removed successfully. Await pathology results, my office will contact you. I recommend repeating colonoscopy in 3-5 years for surveillance purposes. Otherwise follow up with GI as needed.   I hope you have a great rest of your week!  Elon Alas. Abbey Chatters, D.O. Gastroenterology and Hepatology [facility]   Colon Polyps  Colon polyps are tissue growths inside the colon, which is part of the large intestine. They are one of the types of polyps that can grow in the body. A polyp may be a round bump or a mushroom-shaped growth. You could have one polyp or more than one. Most colon polyps are noncancerous (benign). However, some colon polyps can become cancerous over time. Finding and removing the polyps early can help prevent this. What are the causes? The exact cause of colon polyps is not known. What increases the risk? The following factors may make you more likely to develop  this condition:  Having a family history of colorectal cancer or colon polyps.  Being older than 66 years of age.  Being younger than 66 years of age and having a significant family history of colorectal cancer or colon polyps or a genetic condition that puts you at higher risk of getting colon polyps.  Having inflammatory bowel disease, such as ulcerative colitis or Crohn's disease.  Having certain conditions passed from parent to child (hereditary conditions), such as: ? Familial adenomatous polyposis  (FAP). ? Lynch syndrome. ? Turcot syndrome. ? Peutz-Jeghers syndrome. ? MUTYH-associated polyposis (MAP).  Being overweight.  Certain lifestyle factors. These include smoking cigarettes, drinking too much alcohol, not getting enough exercise, and eating a diet that is high in fat and red meat and low in fiber.  Having had childhood cancer that was treated with radiation of the abdomen. What are the signs or symptoms? Many times, there are no symptoms. If you have symptoms, they may include:  Blood coming from the rectum during a bowel movement.  Blood in the stool (feces). The blood may be bright red or very dark in color.  Pain in the abdomen.  A change in bowel habits, such as constipation or diarrhea. How is this diagnosed? This condition is diagnosed with a colonoscopy. This is a procedure in which a lighted, flexible scope is inserted into the opening between the buttocks (anus) and then passed into the colon to examine the area. Polyps are sometimes found when a colonoscopy is done as part of routine cancer screening tests. How is this treated? This condition is treated by removing any polyps that are found. Most polyps can be removed during a colonoscopy. Those polyps will then be tested for cancer. Additional treatment may be needed depending on the results of testing. Follow these instructions at home: Eating and drinking  Eat foods that are high in fiber, such as fruits, vegetables, and whole grains.  Eat foods that are high in calcium and vitamin D, such as milk, cheese, yogurt, eggs, liver, fish, and broccoli.  Limit foods that are high in fat, such as fried foods and desserts.  Limit the amount of red meat, precooked or cured meat, or other processed meat that you eat, such as hot dogs, sausages, bacon, or meat loaves.  Limit sugary drinks.   Lifestyle  Maintain a healthy weight, or lose weight if recommended by your health care provider.  Exercise every day or  as told by your health care provider.  Do not use any products that contain nicotine or tobacco, such as cigarettes, e-cigarettes, and chewing tobacco. If you need help quitting, ask your health care provider.  Do not drink alcohol if: ? Your health care provider tells you not to drink. ? You are pregnant, may be pregnant, or are planning to become pregnant.  If you drink alcohol: ? Limit how much you use to:  0-1 drink a day for women.  0-2 drinks a day for men. ? Know how much alcohol is in your drink. In the [place] one drink equals one 12 oz bottle of beer (355 mL), one 5 oz glass of wine (148 mL), or one 1 oz glass of hard liquor (44 mL). General instructions  Take over-the-counter and prescription medicines only as told by your health care provider.  Keep all follow-up visits. This is important. This includes having regularly scheduled colonoscopies. Talk to your health care provider about when you need a colonoscopy. Contact a health care provider if:  You have new or worsening bleeding during a bowel movement.  You have new or increased blood in your stool.  You have a change in bowel habits.  You lose weight for no known reason. Summary  Colon polyps are tissue growths inside the colon, which is part of the large intestine. They are one type of polyp that can grow in the body.  Most colon polyps are noncancerous (benign), but some can become cancerous over time.  This condition is diagnosed with a colonoscopy.  This condition is treated by removing any polyps that are found. Most polyps can be removed during a colonoscopy. This information is not intended to replace advice given to you by your health care provider. Make sure you discuss any questions you have with your health care provider. Document Revised: 05/12/2019 Document Reviewed: 05/12/2019 Elsevier Patient Education  2021 [employer].

## 2020-07-07 NOTE — Anesthesia Postprocedure Evaluation (Signed)
Anesthesia Post Note  Patient: [name]  Procedure(s) Performed: COLONOSCOPY WITH PROPOFOL (N/A ) POLYPECTOMY  Patient location during evaluation: Phase II Anesthesia Type: General Level of consciousness: awake Pain management: pain level controlled Vital Signs Assessment: post-procedure vital signs reviewed and stable Respiratory status: spontaneous breathing and respiratory function stable Cardiovascular status: blood pressure returned to baseline and stable Postop Assessment: no headache and no apparent nausea or vomiting Anesthetic complications: no Comments: Late entry   No complications documented.   Last Vitals:  Vitals:   07/07/20 0843 07/07/20 0923  BP: 116/82 (!) 105/58  Pulse: 71   Resp: 14 20  Temp: 36.7 C 36.7 C  SpO2: 97% 99%    Last Pain:  Vitals:   07/07/20 0923  TempSrc: Oral  PainSc: 0-No pain                 Louann Sjogren

## 2020-07-07 NOTE — Op Note (Signed)
[facility] Patient Name: [name] Procedure Date: 07/07/2020 9:16 AM MRN: [id number] Date of Birth: [birth year] Attending MD: Elon Alas. Edgar Frisk CSN: [id number] Age: 65 Admit Type: Outpatient Procedure:                Colonoscopy Indications:              Screening for colorectal malignant neoplasm Providers:                Elon Alas. Abbey Chatters, DO, Caprice Kluver, Casimer Bilis, Technician Referring MD:              Medicines:                See the Anesthesia note for documentation of the                            administered medications Complications:            No immediate complications. Estimated Blood Loss:     Estimated blood loss was minimal. Procedure:                Pre-Anesthesia Assessment:                           - The anesthesia plan was to use monitored                            anesthesia care (MAC).                           After obtaining informed consent, the colonoscope                            was passed under direct vision. Throughout the                            procedure, the patient's blood pressure, pulse, and                            oxygen saturations were monitored continuously. The                            PCF-H190DL ([id number]) scope was introduced through                            the anus and advanced to the the cecum, identified                            by appendiceal orifice and ileocecal valve. The                            colonoscopy was performed without difficulty. The                            patient tolerated the procedure well. The  quality                            of the bowel preparation was evaluated using the                            BBPS [facility] Bowel Preparation Scale) with scores                            of: Right Colon = 3, Transverse Colon = 3 and Left                            Colon = 3 (entire mucosa seen well with no residual                            staining,  small fragments of stool or opaque                            liquid). The total BBPS score equals 9. Scope In: 9:27:42 AM Scope Out: 9:42:32 AM Scope Withdrawal Time: 0 hours 12 minutes 59 seconds  Total Procedure Duration: 0 hours 14 minutes 50 seconds  Findings:      The perianal and digital rectal examinations were normal.      A 6 mm polyp was found in the cecum. The polyp was sessile. The polyp       was removed with a cold snare. Resection and retrieval were complete.      A 2 mm polyp was found in the cecum. The polyp was sessile. The polyp       was removed with a cold biopsy forceps. Resection and retrieval were       complete.      Three sessile polyps were found in the transverse colon. The polyps were       4 to 6 mm in size. These polyps were removed with a cold snare.       Resection and retrieval were complete.      The exam was otherwise without abnormality. Impression:               - One 6 mm polyp in the cecum, removed with a cold                            snare. Resected and retrieved.                           - One 2 mm polyp in the cecum, removed with a cold                            biopsy forceps. Resected and retrieved.                           - Three 4 to 6 mm polyps in the transverse colon,                            removed with a cold snare. Resected and retrieved.                           -  The examination was otherwise normal. Moderate Sedation:      Per Anesthesia Care Recommendation:           - Patient has a contact number available for                            emergencies. The signs and symptoms of potential                            delayed complications were discussed with the                            patient. Return to normal activities tomorrow.                            Written discharge instructions were provided to the                            patient.                           - Resume previous diet.                            - Continue present medications.                           - Await pathology results.                           - Repeat colonoscopy in 3 years for surveillance.                           - Return to GI clinic PRN. Procedure Code(s):        --- Professional ---                           [phone], Colonoscopy, flexible; with removal of                            tumor(s), polyp(s), or other lesion(s) by snare                            technique                           45380, 29, Colonoscopy, flexible; with biopsy,                            single or multiple Diagnosis Code(s):        --- Professional ---                           K63.5, Polyp of colon                           Z12.11, Encounter for screening for malignant  neoplasm of colon CPT copyright 2019 American Medical Association. All rights reserved. The codes documented in this report are preliminary and upon coder review may  be revised to meet current compliance requirements. Elon Alas. Abbey Chatters, DO [place] Abbey Chatters, DO 07/07/2020 9:48:44 AM This report has been signed electronically. Number of Addenda: 0

## 2020-07-07 NOTE — H&P (Signed)
Primary Care Physician:  Iona Beard, MD Primary Gastroenterologist:  Dr. Abbey Chatters  Pre-Procedure History & Physical: HPI:  [name] is a 65 y.o. male is here for a colonoscopy for colon cancer screening purposes.  Patient denies any family history of colorectal cancer.  No melena or hematochezia.  No abdominal pain or unintentional weight loss.  No change in bowel habits.  Overall feels well from a GI standpoint.  Past Medical History:  Diagnosis Date  . Alcohol abuse   . Blind   . Hyperlipidemia   . Hypertension     Past Surgical History:  Procedure Laterality Date  . CATARACT EXTRACTION W/PHACO Right 09/26/2014   Procedure: CATARACT EXTRACTION PHACO AND INTRAOCULAR LENS PLACEMENT RIGHT EYE CDE=18.56;  Surgeon: Tonny Branch, MD;  Location: [facility];  Service: Ophthalmology;  Laterality: Right;  . ROTATOR CUFF REPAIR Left 2012  . TOOTH EXTRACTION N/A 1996    Prior to Admission medications   Medication Sig Start Date End Date Taking? Authorizing Provider  amLODipine (NORVASC) 10 MG tablet Take 10 mg by mouth daily.   Yes [provider]  aspirin 81 MG chewable tablet Chew by mouth daily.   Yes [provider]  atorvastatin (LIPITOR) 20 MG tablet Take 20 mg by mouth daily.   Yes [provider]  Oxymetazoline HCl (NASAL SPRAY NA) Place 1 spray into the nose daily as needed (Congestion).   Yes [provider]  polyethylene glycol-electrolytes (NULYTELY) 420 g solution As directed 05/25/20  Yes Hurshel Keys K, DO  sildenafil (VIAGRA) 100 MG tablet Take 100 mg by mouth daily as needed for erectile dysfunction. 02/23/20  Yes [provider]  valsartan-hydrochlorothiazide (DIOVAN-HCT) 160-25 MG per tablet Take 1 tablet by mouth daily.   Yes [provider]    Allergies as of 05/25/2020 - Review Complete 05/25/2020  Allergen Reaction Noted  . Cortisone  08/05/2012    Family History  Problem Relation Age of Onset  . Colon cancer  Neg Hx     Social History   Socioeconomic History  . Marital status: Single    Spouse name: Not on file  . Number of children: Not on file  . Years of education: Not on file  . Highest education level: Not on file  Occupational History  . Not on file  Tobacco Use  . Smoking status: Current Every Day Smoker    Packs/day: 0.50    Years: 20.00    Pack years: 10.00    Types: Cigarettes  . Smokeless tobacco: Never Used  Vaping Use  . Vaping Use: Never used  Substance and Sexual Activity  . Alcohol use: Not Currently    Alcohol/week: 1.0 standard drink    Types: 1 Glasses of wine per week    Comment: used to drink heavily. Last drank years ago.   . Drug use: No  . Sexual activity: Not on file  Other Topics Concern  . Not on file  Social History Narrative  . Not on file   Social Determinants of Health   Financial Resource Strain: Not on file  Food Insecurity: Not on file  Transportation Needs: Not on file  Physical Activity: Not on file  Stress: Not on file  Social Connections: Not on file  Intimate Partner Violence: Not on file    Review of Systems: See HPI, otherwise negative ROS  Physical Exam: Vital signs in last 24 hours: Temp:  [98.1 F (36.7 C)] 98.1 F (36.7 C) (06/03 0843) Pulse Rate:  [  71] 71 (06/03 0843) Resp:  [14] 14 (06/03 0843) BP: (116)/(82) 116/82 (06/03 0843) SpO2:  [97 %] 97 % (06/03 0843) Weight:  [92.1 kg] 92.1 kg (06/03 0843)   General:   Alert,  Well-developed, well-nourished, pleasant and cooperative in NAD Head:  Normocephalic and atraumatic. Eyes:  Sclera clear, no icterus.   Conjunctiva pink. Ears:  Normal auditory acuity. Nose:  No deformity, discharge,  or lesions. Mouth:  No deformity or lesions, dentition normal. Neck:  Supple; no masses or thyromegaly. Lungs:  Clear throughout to auscultation.   No wheezes, crackles, or rhonchi. No acute distress. Heart:  Regular rate and rhythm; no murmurs, clicks, rubs,  or  gallops. Abdomen:  Soft, nontender and nondistended. No masses, hepatosplenomegaly or hernias noted. Normal bowel sounds, without guarding, and without rebound.   Msk:  Symmetrical without gross deformities. Normal posture. Extremities:  Without clubbing or edema. Neurologic:  Alert and  oriented x4;  grossly normal neurologically. Skin:  Intact without significant lesions or rashes. Cervical Nodes:  No significant cervical adenopathy. Psych:  Alert and cooperative. Normal mood and affect.  Impression/Plan: [name] is here for a colonoscopy to be performed for colon cancer screening purposes.  The risks of the procedure including infection, bleed, or perforation as well as benefits, limitations, alternatives and imponderables have been reviewed with the patient. Questions have been answered. All parties agreeable.

## 2020-07-10 LAB — SURGICAL PATHOLOGY

## 2020-07-13 ENCOUNTER — Telehealth: Payer: Self-pay | Admitting: Internal Medicine

## 2020-07-13 ENCOUNTER — Telehealth: Payer: Self-pay

## 2020-07-13 NOTE — Telephone Encounter (Signed)
Returned the pt's call and he is wondering was there something wrong or was his pathology report finished. I see it but you haven't sent it to me yet. Please advise

## 2020-07-13 NOTE — Telephone Encounter (Signed)
Pt had procedure last Friday and was concerned about the 5 polyps that were removed. He was wanting to know if his results were back. Please advise. [phone]

## 2020-07-13 NOTE — Telephone Encounter (Signed)
error 

## 2020-07-15 DIAGNOSIS — E042 Nontoxic multinodular goiter: Secondary | ICD-10-CM | POA: Diagnosis not present

## 2020-07-15 DIAGNOSIS — E785 Hyperlipidemia, unspecified: Secondary | ICD-10-CM | POA: Diagnosis not present

## 2020-07-15 DIAGNOSIS — F5221 Male erectile disorder: Secondary | ICD-10-CM | POA: Diagnosis not present

## 2020-07-15 DIAGNOSIS — H548 Legal blindness, as defined in USA: Secondary | ICD-10-CM | POA: Diagnosis not present

## 2020-07-15 DIAGNOSIS — I10 Essential (primary) hypertension: Secondary | ICD-10-CM | POA: Diagnosis not present

## 2020-07-15 DIAGNOSIS — M79642 Pain in left hand: Secondary | ICD-10-CM | POA: Diagnosis not present

## 2020-07-17 ENCOUNTER — Encounter (HOSPITAL_COMMUNITY): Payer: Self-pay | Admitting: Internal Medicine

## 2020-07-17 NOTE — Telephone Encounter (Signed)
Already addressed in result note.

## 2020-07-29 DIAGNOSIS — Z20822 Contact with and (suspected) exposure to covid-19: Secondary | ICD-10-CM | POA: Diagnosis not present

## 2020-07-29 DIAGNOSIS — J069 Acute upper respiratory infection, unspecified: Secondary | ICD-10-CM | POA: Diagnosis not present

## 2020-07-29 DIAGNOSIS — J209 Acute bronchitis, unspecified: Secondary | ICD-10-CM | POA: Diagnosis not present

## 2020-09-03 DIAGNOSIS — I10 Essential (primary) hypertension: Secondary | ICD-10-CM | POA: Diagnosis not present

## 2020-09-03 DIAGNOSIS — E7849 Other hyperlipidemia: Secondary | ICD-10-CM | POA: Diagnosis not present

## 2020-11-03 DIAGNOSIS — I10 Essential (primary) hypertension: Secondary | ICD-10-CM | POA: Diagnosis not present

## 2020-11-03 DIAGNOSIS — E7849 Other hyperlipidemia: Secondary | ICD-10-CM | POA: Diagnosis not present

## 2020-12-04 DIAGNOSIS — E7849 Other hyperlipidemia: Secondary | ICD-10-CM | POA: Diagnosis not present

## 2020-12-04 DIAGNOSIS — I10 Essential (primary) hypertension: Secondary | ICD-10-CM | POA: Diagnosis not present

## 2021-02-01 DIAGNOSIS — Z125 Encounter for screening for malignant neoplasm of prostate: Secondary | ICD-10-CM | POA: Diagnosis not present

## 2021-02-01 DIAGNOSIS — H548 Legal blindness, as defined in USA: Secondary | ICD-10-CM | POA: Diagnosis not present

## 2021-02-01 DIAGNOSIS — I10 Essential (primary) hypertension: Secondary | ICD-10-CM | POA: Diagnosis not present

## 2021-02-01 DIAGNOSIS — F5221 Male erectile disorder: Secondary | ICD-10-CM | POA: Diagnosis not present

## 2021-02-01 DIAGNOSIS — E042 Nontoxic multinodular goiter: Secondary | ICD-10-CM | POA: Diagnosis not present

## 2021-02-01 DIAGNOSIS — E785 Hyperlipidemia, unspecified: Secondary | ICD-10-CM | POA: Diagnosis not present

## 2021-02-02 ENCOUNTER — Other Ambulatory Visit: Payer: Self-pay | Admitting: Family Medicine

## 2021-02-02 ENCOUNTER — Other Ambulatory Visit (HOSPITAL_COMMUNITY): Payer: Self-pay | Admitting: Family Medicine

## 2021-02-02 DIAGNOSIS — F17200 Nicotine dependence, unspecified, uncomplicated: Secondary | ICD-10-CM

## 2021-03-06 ENCOUNTER — Ambulatory Visit (HOSPITAL_COMMUNITY)
Admission: RE | Admit: 2021-03-06 | Discharge: 2021-03-06 | Disposition: A | Discharge status: Home / Self Care | Payer: Medicare HMO | Source: Ambulatory Visit | Attending: Family Medicine | Admitting: Family Medicine

## 2021-03-06 ENCOUNTER — Other Ambulatory Visit: Payer: Self-pay

## 2021-03-06 DIAGNOSIS — J439 Emphysema, unspecified: Secondary | ICD-10-CM | POA: Diagnosis not present

## 2021-03-06 DIAGNOSIS — I700 Atherosclerosis of aorta: Secondary | ICD-10-CM | POA: Insufficient documentation

## 2021-03-06 DIAGNOSIS — Z122 Encounter for screening for malignant neoplasm of respiratory organs: Secondary | ICD-10-CM | POA: Insufficient documentation

## 2021-03-06 DIAGNOSIS — I2510 Atherosclerotic heart disease of native coronary artery without angina pectoris: Secondary | ICD-10-CM | POA: Diagnosis not present

## 2021-03-06 DIAGNOSIS — F17210 Nicotine dependence, cigarettes, uncomplicated: Secondary | ICD-10-CM | POA: Insufficient documentation

## 2021-03-06 DIAGNOSIS — F17200 Nicotine dependence, unspecified, uncomplicated: Secondary | ICD-10-CM

## 2021-04-03 ENCOUNTER — Encounter (HOSPITAL_COMMUNITY): Payer: Self-pay | Admitting: Radiology

## 2021-07-04 DIAGNOSIS — I10 Essential (primary) hypertension: Secondary | ICD-10-CM | POA: Diagnosis not present

## 2021-07-04 DIAGNOSIS — E7849 Other hyperlipidemia: Secondary | ICD-10-CM | POA: Diagnosis not present

## 2021-07-23 DIAGNOSIS — Z6833 Body mass index (BMI) 33.0-33.9, adult: Secondary | ICD-10-CM | POA: Diagnosis not present

## 2021-07-23 DIAGNOSIS — I700 Atherosclerosis of aorta: Secondary | ICD-10-CM | POA: Diagnosis not present

## 2021-07-23 DIAGNOSIS — E785 Hyperlipidemia, unspecified: Secondary | ICD-10-CM | POA: Diagnosis not present

## 2021-07-23 DIAGNOSIS — H548 Legal blindness, as defined in USA: Secondary | ICD-10-CM | POA: Diagnosis not present

## 2021-07-23 DIAGNOSIS — I10 Essential (primary) hypertension: Secondary | ICD-10-CM | POA: Diagnosis not present

## 2021-07-23 DIAGNOSIS — F5221 Male erectile disorder: Secondary | ICD-10-CM | POA: Diagnosis not present

## 2021-07-23 DIAGNOSIS — E042 Nontoxic multinodular goiter: Secondary | ICD-10-CM | POA: Diagnosis not present

## 2021-07-23 DIAGNOSIS — J432 Centrilobular emphysema: Secondary | ICD-10-CM | POA: Diagnosis not present

## 2022-03-09 DIAGNOSIS — G5603 Carpal tunnel syndrome, bilateral upper limbs: Secondary | ICD-10-CM | POA: Diagnosis not present

## 2022-03-09 DIAGNOSIS — H548 Legal blindness, as defined in USA: Secondary | ICD-10-CM | POA: Diagnosis not present

## 2022-03-09 DIAGNOSIS — I10 Essential (primary) hypertension: Secondary | ICD-10-CM | POA: Diagnosis not present

## 2022-03-09 DIAGNOSIS — E785 Hyperlipidemia, unspecified: Secondary | ICD-10-CM | POA: Diagnosis not present

## 2022-03-09 DIAGNOSIS — E042 Nontoxic multinodular goiter: Secondary | ICD-10-CM | POA: Diagnosis not present

## 2022-03-09 DIAGNOSIS — R300 Dysuria: Secondary | ICD-10-CM | POA: Diagnosis not present

## 2022-04-14 ENCOUNTER — Ambulatory Visit (INDEPENDENT_AMBULATORY_CARE_PROVIDER_SITE_OTHER): Payer: Medicare HMO

## 2022-04-14 ENCOUNTER — Ambulatory Visit
Admission: EM | Admit: 2022-04-14 | Discharge: 2022-04-14 | Disposition: A | Discharge status: Home / Self Care | Payer: Medicare HMO | Attending: Family Medicine | Admitting: Family Medicine

## 2022-04-14 DIAGNOSIS — W19XXXA Unspecified fall, initial encounter: Secondary | ICD-10-CM

## 2022-04-14 DIAGNOSIS — M545 Low back pain, unspecified: Secondary | ICD-10-CM

## 2022-04-14 MED ORDER — CYCLOBENZAPRINE HCL 5 MG PO TABS: 5.0000 mg | ORAL_TABLET | Freq: Every evening | ORAL | 0 refills | Status: DC | PRN

## 2022-04-14 MED ORDER — NAPROXEN 500 MG PO TABS: 500.0000 mg | ORAL_TABLET | Freq: Two times a day (BID) | ORAL | 0 refills | Status: DC | PRN

## 2022-04-14 NOTE — ED Triage Notes (Addendum)
Pt reports he fell and hurt his low back coming from out the rain x 3 days ago. When he tries to lay down the pain increases. Took tylenol but no relief.

## 2022-04-14 NOTE — Discharge Instructions (Signed)
Your x-ray today of your low back was inconclusive and shows a possible mild compression fracture at the first lumbar vertebra in your spine.  This is typically treated with a back brace to provide support to your core, lifting restrictions and avoidance of strenuous activities and close orthopedic follow-up.  The orthopedist will likely want to repeat imaging or provide more advanced imaging to get a more conclusive answer on what is going on.  I have placed resources in the follow-up instructions for who to call to make this follow-up appointment and have sent a muscle relaxer and anti-inflammatory medication to help with your pain.  Use the back brace, heat, massage, Tylenol additionally as needed.

## 2022-04-15 ENCOUNTER — Telehealth: Payer: Self-pay

## 2022-04-15 NOTE — Telephone Encounter (Signed)
I called patient and he gave me his daughter's phone number to call, since he is legally blind and she will be bringing him to the office. Daughter called me back while I was typing up this note. Patient is scheduled to see Dr. Luna Glasgow tomorrow morning.

## 2022-04-16 ENCOUNTER — Telehealth: Payer: Self-pay

## 2022-04-16 ENCOUNTER — Encounter: Payer: Self-pay | Admitting: Orthopaedic Surgery

## 2022-04-16 ENCOUNTER — Telehealth: Payer: Self-pay | Admitting: Orthopaedic Surgery

## 2022-04-16 ENCOUNTER — Ambulatory Visit (HOSPITAL_COMMUNITY)
Admission: RE | Admit: 2022-04-16 | Discharge: 2022-04-16 | Disposition: A | Discharge status: Home / Self Care | Payer: Medicare HMO | Source: Ambulatory Visit | Attending: Orthopaedic Surgery | Admitting: Orthopaedic Surgery

## 2022-04-16 ENCOUNTER — Ambulatory Visit (INDEPENDENT_AMBULATORY_CARE_PROVIDER_SITE_OTHER): Payer: Medicare HMO | Admitting: Orthopaedic Surgery

## 2022-04-16 ENCOUNTER — Ambulatory Visit: Payer: Medicare HMO | Admitting: Orthopaedic Surgery

## 2022-04-16 VITALS — BP 116/68 | HR 68 | Ht 70.0 in | Wt 214.0 lb

## 2022-04-16 DIAGNOSIS — S32010A Wedge compression fracture of first lumbar vertebra, initial encounter for closed fracture: Secondary | ICD-10-CM | POA: Diagnosis not present

## 2022-04-16 DIAGNOSIS — M4316 Spondylolisthesis, lumbar region: Secondary | ICD-10-CM | POA: Diagnosis not present

## 2022-04-16 DIAGNOSIS — M8588 Other specified disorders of bone density and structure, other site: Secondary | ICD-10-CM | POA: Diagnosis not present

## 2022-04-16 MED ORDER — HYDROCODONE-ACETAMINOPHEN 5-325 MG PO TABS: ORAL_TABLET | ORAL | 0 refills | Status: DC

## 2022-04-16 NOTE — Telephone Encounter (Signed)
Spoke with patient. Let him know that he does have a fracture but it will not require him to wear the cash brace. Advised him that he will be in pain and something has been called in for him. Follow up as planned. He verbalized understanding

## 2022-04-16 NOTE — Telephone Encounter (Signed)
[facility] w/radiology calling w/a stat report for this patient [phone]

## 2022-04-16 NOTE — Progress Notes (Addendum)
Subjective:    Patient ID: [name], male    DOB: 05-02-1955, 67 y.o.   MRN: [id number]  HPI He fell at home about five days ago and hurt his lower back.  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  He has had back pain. He has no numbness.  Because it did not get better, he went to the Urgent Care yesterday.  X-rays showed: IMPRESSION: Possible mild L1 compression fracture. Correlate for pain in this region and consider lumbar MRI (preferred) or CT if there is clinical concern for an acute fracture.  I will get CT stat of lower back.  I have independently reviewed and interpreted x-rays of this patient done at another site by another physician or qualified health professional.    Review of Systems  Constitutional:  Positive for activity change.  HENT:         Blind  Musculoskeletal:  Positive for arthralgias and back pain.  All other systems reviewed and are negative. For Review of Systems, all other systems reviewed and are negative.  The following is a summary of the past history medically, past history surgically, known current medicines, social history and family history.  This information is gathered electronically by the computer from prior information and documentation.  I review this each visit and have found including this information at this point in the chart is beneficial and informative.   Past Medical History:  Diagnosis Date   Alcohol abuse    Blind    Hyperlipidemia    Hypertension     Past Surgical History:  Procedure Laterality Date   CATARACT EXTRACTION W/PHACO Right 09/26/2014   Procedure: CATARACT EXTRACTION PHACO AND INTRAOCULAR LENS PLACEMENT RIGHT EYE CDE=18.56;  Surgeon: Tonny Branch, MD;  Location: [facility];  Service: Ophthalmology;  Laterality: Right;   COLONOSCOPY WITH PROPOFOL N/A 07/07/2020   Procedure: COLONOSCOPY WITH PROPOFOL;  Surgeon: Eloise Harman, DO;  Location: [facility];  Service: Endoscopy;  Laterality: N/A;  10:00am    POLYPECTOMY  07/07/2020   Procedure: POLYPECTOMY;  Surgeon: Eloise Harman, DO;  Location: [facility];  Service: Endoscopy;;   ROTATOR CUFF REPAIR Left 2012   TOOTH EXTRACTION N/A 1996    Current Outpatient Medications on File Prior to Visit  Medication Sig Dispense Refill   amLODipine (NORVASC) 10 MG tablet Take 10 mg by mouth daily.     aspirin 81 MG chewable tablet Chew by mouth daily.     atorvastatin (LIPITOR) 20 MG tablet Take 20 mg by mouth daily.     cyclobenzaprine (FLEXERIL) 5 MG tablet Take 1 tablet (5 mg total) by mouth at bedtime as needed for muscle spasms. Do not drink alcohol or drive while taking this medication.  May cause drowsiness. 10 tablet 0   naproxen (NAPROSYN) 500 MG tablet Take 1 tablet (500 mg total) by mouth 2 (two) times daily as needed. 20 tablet 0   Oxymetazoline HCl (NASAL SPRAY NA) Place 1 spray into the nose daily as needed (Congestion).     sildenafil (VIAGRA) 100 MG tablet Take 100 mg by mouth daily as needed for erectile dysfunction.     valsartan-hydrochlorothiazide (DIOVAN-HCT) 160-25 MG per tablet Take 1 tablet by mouth daily.     No current facility-administered medications on file prior to visit.    Social History   Socioeconomic History   Marital status: Single    Spouse name: Not on file   Number of children: Not on file  Years of education: Not on file   Highest education level: Not on file  Occupational History   Not on file  Tobacco Use   Smoking status: Every Day    Packs/day: 0.50    Years: 20.00    Total pack years: 10.00    Types: Cigarettes   Smokeless tobacco: Never  Vaping Use   Vaping Use: Never used  Substance and Sexual Activity   Alcohol use: Not Currently    Alcohol/week: 1.0 standard drink of alcohol    Types: 1 Glasses of wine per week    Comment: used to drink heavily. Last drank years ago.    Drug use: No   Sexual activity: Not on file  Other Topics Concern   Not on file  Social History Narrative    Not on file   Social Determinants of Health   Financial Resource Strain: Not on file  Food Insecurity: Not on file  Transportation Needs: Not on file  Physical Activity: Not on file  Stress: Not on file  Social Connections: Not on file  Intimate Partner Violence: Not on file    Family History  Problem Relation Age of Onset   Cancer Sister 27       breast ca   Hypertension Sister    Hypertension Sister    Hypertension Sister    Hypertension Sister    Hypertension Brother    Hypertension Brother    Hypertension Brother    Colon cancer Neg Hx     BP 116/68   Pulse 68   Ht '5\' 10"'$  (1.778 m)   Wt 214 lb (97.1 kg)   BMI 30.71 kg/m   Body mass index is 30.71 kg/m.      Objective:   Physical Exam Vitals and nursing note reviewed. Exam conducted with a chaperone present.  Constitutional:      Appearance: He is well-developed.  HENT:     Head: Normocephalic and atraumatic.  Eyes:     Conjunctiva/sclera: Conjunctivae normal.     Pupils: Pupils are equal, round, and reactive to light.  Cardiovascular:     Rate and Rhythm: Normal rate and regular rhythm.  Pulmonary:     Effort: Pulmonary effort is normal.  Abdominal:     Palpations: Abdomen is soft.  Musculoskeletal:       Arms:     Cervical back: Normal range of motion and neck supple.  Skin:    General: Skin is warm and dry.  Neurological:     Mental Status: He is alert and oriented to person, place, and time.     Cranial Nerves: No cranial nerve deficit.     Motor: No abnormal muscle tone.     Coordination: Coordination normal.     Deep Tendon Reflexes: Reflexes are normal and symmetric. Reflexes normal.  Psychiatric:        Behavior: Behavior normal.        Thought Content: Thought content normal.        Judgment: Judgment normal.           Assessment & Plan:   Encounter Diagnosis  Name Primary?   Compression fracture of L1 vertebra, initial encounter ([place]) Yes   I will get Stat CT of lumbar  spine.  I will call in pain medicine.  I have reviewed the [place] web site prior to prescribing narcotic medicine for this patient.  Return in one week.  [facility].  Call if any problem.  Precautions discussed.  Electronically Signed Sanjuana Kava, MD 3/12/20249:55 AM  He got the CT exam and it showed: IMPRESSION: 1. Mild, left eccentric superior endplate wedge deformity of L1 with approximately 10% anterior height loss. 2. Osteopenia. 3. Moderate facet degenerative change at the lower lumbar levels. 4. Mild anterior bridging osteophytosis throughout the lumbar spine without significant disc space height loss. 5. Minimal degenerative anterolisthesis of L4 on L5.   I will cancel the CASH brace as his fracture is only 10%.  Take the pain medicine.  Return in one week as scheduled.  Call if any problem.  Precautions discussed.  Electronically Signed Sanjuana Kava, MD 3/12/202411:00 AM

## 2022-04-17 NOTE — ED Provider Notes (Signed)
[facility]    CSN: [id number] Arrival date & time: 04/14/22  1158      History   Chief Complaint No chief complaint on file.   HPI [name] is a 67 y.o. male.   Presenting today after a fall that occurred 3 days ago when he slipped in the rain, falling onto his buttocks.  He states his low back area and mid back area is very painful, worse with laying down but has been performing his normal daily activities since the incident.  Denies radiation of pain down legs, numbness, tingling, decreased range of motion, bowel or bladder incontinence, saddle anesthesias.  Taking Tylenol with no relief so far.    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   OBESITY 01/08/2007   Hyperlipidemia 12/25/2005   TOBACCO ABUSE 12/25/2005   BLINDNESS, LEGAL, [place] DEFINITION 12/25/2005   Essential hypertension 12/25/2005   TRIGGER FINGER 12/25/2005    Past Surgical History:  Procedure Laterality Date   CATARACT EXTRACTION W/PHACO Right 09/26/2014   Procedure: CATARACT EXTRACTION PHACO AND INTRAOCULAR LENS PLACEMENT RIGHT EYE CDE=18.56;  Surgeon: Tonny Branch, MD;  Location: [facility];  Service: Ophthalmology;  Laterality: Right;   COLONOSCOPY WITH PROPOFOL N/A 07/07/2020   Procedure: COLONOSCOPY WITH PROPOFOL;  Surgeon: Eloise Harman, DO;  [facility];  Service: Endoscopy;  Laterality: N/A;  10:00am   POLYPECTOMY  07/07/2020   Procedure: POLYPECTOMY;  Surgeon: Eloise Harman, DO;  [facility];  Service: Endoscopy;;   ROTATOR CUFF REPAIR Left 2012   TOOTH EXTRACTION N/A  1996       Home Medications    Prior to Admission medications   Medication Sig Start Date End Date Taking? Authorizing Provider  cyclobenzaprine (FLEXERIL) 5 MG tablet Take 1 tablet (5 mg total) by mouth at bedtime as needed for muscle spasms. Do not drink alcohol or drive while taking this medication.  May cause drowsiness. 04/14/22  Yes Volney American, PA-C  naproxen (NAPROSYN) 500 MG tablet Take 1 tablet (500 mg total) by mouth 2 (two) times daily as needed. 04/14/22  Yes Volney American, PA-C  amLODipine (NORVASC) 10 MG tablet Take 10 mg by mouth daily.    [provider]  aspirin 81 MG chewable tablet Chew by mouth daily.    [provider]  atorvastatin (LIPITOR) 20 MG tablet Take 20 mg by mouth daily.    [provider]  HYDROcodone-acetaminophen (NORCO/VICODIN) 5-325 MG tablet One tablet every four hours as needed for acute pain.  Limit of five days per [place] statue. 04/16/22   Sanjuana Kava, MD  Oxymetazoline HCl (NASAL SPRAY NA) Place 1 spray into the nose daily as needed (Congestion).    [provider]  sildenafil (VIAGRA) 100 MG tablet Take 100 mg by mouth daily as needed for erectile dysfunction. 02/23/20   [provider]  valsartan-hydrochlorothiazide (DIOVAN-HCT) 160-25 MG per tablet Take 1 tablet by mouth daily.    [provider]    Family History Family History  Problem  Relation Age of Onset   Cancer Sister 59       breast ca   Hypertension Sister    Hypertension Sister    Hypertension Sister    Hypertension Sister    Hypertension Brother    Hypertension Brother    Hypertension Brother    Colon cancer Neg Hx     Social History Social History   Tobacco Use   Smoking status: Every Day    Packs/day: 0.50    Years: 20.00    Total pack years: 10.00    Types: Cigarettes   Smokeless tobacco: Never  Vaping Use   Vaping Use: Never used  Substance Use Topics   Alcohol use: Not Currently     Alcohol/week: 1.0 standard drink of alcohol    Types: 1 Glasses of wine per week    Comment: used to drink heavily. Last drank years ago.    Drug use: No     Allergies   Cortisone   Review of Systems Review of Systems Per HPI  Physical Exam Triage Vital Signs ED Triage Vitals  Enc Vitals Group     BP 04/14/22 1321 126/74     Pulse Rate 04/14/22 1321 77     Resp 04/14/22 1321 20     Temp 04/14/22 1321 98.1 F (36.7 C)     Temp Source 04/14/22 1321 Oral     SpO2 04/14/22 1321 94 %     Weight --      Height --      Head Circumference --      Peak Flow --      Pain Score 04/14/22 1237 6     Pain Loc --      Pain Edu? --      Excl. in [place]? --    No data found.  Updated Vital Signs BP 126/74 (BP Location: Right Arm)   Pulse 77   Temp 98.1 F (36.7 C) (Oral)   Resp 20   SpO2 94%   Visual Acuity Right Eye Distance:   Left Eye Distance:   Bilateral Distance:    Right Eye Near:   Left Eye Near:    Bilateral Near:     Physical Exam Vitals and nursing note reviewed.  Constitutional:      Appearance: Normal appearance.  HENT:     Head: Atraumatic.  Eyes:     Extraocular Movements: Extraocular movements intact.     Conjunctiva/sclera: Conjunctivae normal.  Cardiovascular:     Rate and Rhythm: Normal rate and regular rhythm.  Pulmonary:     Effort: Pulmonary effort is normal.     Breath sounds: Normal breath sounds.  Musculoskeletal:        General: Tenderness and signs of injury present. No swelling or deformity. Normal range of motion.     Cervical back: Normal range of motion and neck supple.     Comments: Lumbar midline tenderness to palpation without bony deformity palpable or point tenderness.  Also tender in the paraspinal muscles of the lumbar and thoracic regions.  Range of motion and gait intact  Skin:    General: Skin is warm and dry.     Findings: No bruising or erythema.  Neurological:     General: No focal deficit present.     Mental Status:  He is oriented to person, place, and time.     Motor: No weakness.     Gait: Gait normal.     Comments: Bilateral lower extremities neurovascularly  intact  Psychiatric:        Mood and Affect: Mood normal.        Thought Content: Thought content normal.        Judgment: Judgment normal.      UC Treatments / Results  Labs (all labs ordered are listed, but only abnormal results are displayed) Labs Reviewed - No data to display  EKG   Radiology CT LUMBAR SPINE WO CONTRAST  Result Date: 04/16/2022 CLINICAL DATA:  Recent fall, possible mild L1 compression fracture seen on x-ray EXAM: CT LUMBAR SPINE WITHOUT CONTRAST TECHNIQUE: Multidetector CT imaging of the lumbar spine was performed without intravenous contrast administration. Multiplanar CT image reconstructions were also generated. RADIATION DOSE REDUCTION: This exam was performed according to the departmental dose-optimization program which includes automated exposure control, adjustment of the mA and/or kV according to patient size and/or use of iterative reconstruction technique. COMPARISON:  Lumbar spine radiographs, 04/14/2022 FINDINGS: Segmentation: Five lumbar type vertebrae. Alignment: Minimal degenerative anterolisthesis of L4 on L5. Otherwise normal alignment. Normal lumbar lordosis. Vertebrae: Osteopenia. Mild, left eccentric superior endplate wedge deformity of L1 with approximately 10% anterior height loss (series 5, image 31). Vertebral bodies otherwise intact. Paraspinal and other soft tissues: Unremarkable. No significant hematoma. Disc levels: Mild anterior bridging osteophytosis throughout the lumbar spine without significant disc space height loss. Moderate facet degenerative change at the lower lumbar levels (series 5, image 91). IMPRESSION: 1. Mild, left eccentric superior endplate wedge deformity of L1 with approximately 10% anterior height loss. 2. Osteopenia. 3. Moderate facet degenerative change at the lower lumbar  levels. 4. Mild anterior bridging osteophytosis throughout the lumbar spine without significant disc space height loss. 5. Minimal degenerative anterolisthesis of L4 on L5. These results will be called to the ordering clinician or representative by the Radiologist Assistant, and communication documented in the PACS or [employer]. Electronically Signed   By: Delanna Ahmadi M.D.   On: 04/16/2022 10:50    Procedures Procedures (including critical care time)  Medications Ordered in UC Medications - No data to display  Initial Impression / Assessment and Plan / UC Course  I have reviewed the triage vital signs and the nursing notes.  Pertinent labs & imaging results that were available during my care of the patient were reviewed by me and considered in my medical decision making (see chart for details).     X-ray today of the lumbar spine shows a potential mild compression fraction at L1 as well as degenerative changes throughout.  Discussed back bracing, close orthopedic follow-up for further imaging and management, avoiding any strenuous activities or lifting, pain control with naproxen, Flexeril, Tylenol.  Strict return precautions reviewed.  No red flag findings today.  Final Clinical Impressions(s) / UC Diagnoses   Final diagnoses:  Lumbar back pain  Fall, initial encounter     Discharge Instructions      Your x-ray today of your low back was inconclusive and shows a possible mild compression fracture at the first lumbar vertebra in your spine.  This is typically treated with a back brace to provide support to your core, lifting restrictions and avoidance of strenuous activities and close orthopedic follow-up.  The orthopedist will likely want to repeat imaging or provide more advanced imaging to get a more conclusive answer on what is going on.  I have placed resources in the follow-up instructions for who to call to make this follow-up appointment and have sent a muscle relaxer and  anti-inflammatory medication to help  with your pain.  Use the back brace, heat, massage, Tylenol additionally as needed.     ED Prescriptions     Medication Sig Dispense Auth. Provider   cyclobenzaprine (FLEXERIL) 5 MG tablet Take 1 tablet (5 mg total) by mouth at bedtime as needed for muscle spasms. Do not drink alcohol or drive while taking this medication.  May cause drowsiness. 10 tablet Volney American, [place]   naproxen (NAPROSYN) 500 MG tablet Take 1 tablet (500 mg total) by mouth 2 (two) times daily as needed. 20 tablet Volney American, [place]      PDMP not reviewed this encounter.   Volney American, [place] 04/17/22 1407

## 2022-04-17 NOTE — Telephone Encounter (Signed)
Dr.Keeling was called with the report.

## 2022-04-23 ENCOUNTER — Ambulatory Visit: Payer: Medicare HMO | Admitting: Orthopaedic Surgery

## 2022-04-30 ENCOUNTER — Ambulatory Visit: Payer: Medicare HMO | Admitting: Orthopaedic Surgery

## 2022-04-30 ENCOUNTER — Encounter: Payer: Self-pay | Admitting: Orthopaedic Surgery

## 2022-04-30 DIAGNOSIS — S32010D Wedge compression fracture of first lumbar vertebra, subsequent encounter for fracture with routine healing: Secondary | ICD-10-CM

## 2022-04-30 DIAGNOSIS — R6889 Other general symptoms and signs: Secondary | ICD-10-CM | POA: Diagnosis not present

## 2022-04-30 MED ORDER — HYDROCODONE-ACETAMINOPHEN 7.5-325 MG PO TABS: 1.0000 | ORAL_TABLET | Freq: Four times a day (QID) | ORAL | 0 refills | Status: AC | PRN

## 2022-04-30 NOTE — Patient Instructions (Addendum)
YOU HAVE BEEN PRESCRIBED A NARCOTIC/PAIN MEDICATION. DO NOT DRIVE OR OPERATE HEAVY MACHINERY WHILE USING THIS MEDICATION. PAIN MEDICATION ALSO CAUSES CONSTIPATION. YOU CAN USE A STOOL SOFTENER OR MIRALAX TO HELP WITH THIS.   It will take you about 12 weeks to heal from this injury. This is a type of break that does NOT cause paralysis. It's going to hurt but the pain should improve as you heal.   Patient Instructions: Continue what you are currently doing. He has increased your pain medication.      Testing/Procedures: xray Lumbar Spine at next visit  Follow-Up: 1 month w/ xray  If you need a refill on your  medications prescribed by Dr. Luna Glasgow before your next appointment, please call your pharmacy.

## 2022-04-30 NOTE — Progress Notes (Signed)
I still have some pain.  He is doing well but has some constipation and pain in his back.  He would like a stronger pain pill and I will do so.  NV intact.  He is tender in the lumbar spine, no spasm.  He is totally blind.  Encounter Diagnosis  Name Primary?   Compression fracture of L1 vertebra with routine healing, subsequent encounter Yes   Return in one month.  I will give Norco 7.5.  I have reviewed the [place] web site prior to prescribing narcotic medicine for this patient.  Call if any problem.  Precautions discussed.  Electronically Signed Sanjuana Kava, MD 3/26/20242:32 PM

## 2022-05-22 ENCOUNTER — Telehealth: Payer: Self-pay | Admitting: Orthopaedic Surgery

## 2022-05-22 NOTE — Telephone Encounter (Signed)
Dr. Sanjuan Dame pt - spoke w/the patient, he is requesting a refill on Hydrocodone 7.5-325 to be sent to [facility].

## 2022-05-23 MED ORDER — HYDROCODONE-ACETAMINOPHEN 7.5-325 MG PO TABS: 1.0000 | ORAL_TABLET | Freq: Four times a day (QID) | ORAL | 0 refills | Status: DC | PRN

## 2022-05-28 ENCOUNTER — Other Ambulatory Visit (INDEPENDENT_AMBULATORY_CARE_PROVIDER_SITE_OTHER): Payer: Medicare HMO

## 2022-05-28 ENCOUNTER — Ambulatory Visit (INDEPENDENT_AMBULATORY_CARE_PROVIDER_SITE_OTHER): Payer: Medicare HMO | Admitting: Orthopaedic Surgery

## 2022-05-28 ENCOUNTER — Encounter: Payer: Self-pay | Admitting: Orthopaedic Surgery

## 2022-05-28 DIAGNOSIS — S32010D Wedge compression fracture of first lumbar vertebra, subsequent encounter for fracture with routine healing: Secondary | ICD-10-CM

## 2022-05-28 DIAGNOSIS — R6889 Other general symptoms and signs: Secondary | ICD-10-CM | POA: Diagnosis not present

## 2022-05-28 NOTE — Progress Notes (Signed)
I am better.  His back is less painful.  He has no new trauma or problem.  X-rays were done of the lumbar spine, reported separately.  NV intact.  ROM is good.  Encounter Diagnosis  Name Primary?   Compression fracture of L1 vertebra with routine healing, subsequent encounter Yes   Return in one month.  X-rays then.  Call if any problem.  Precautions discussed.  Electronically Signed Darreld Mclean, MD 4/23/20241:48 PM

## 2022-06-05 ENCOUNTER — Other Ambulatory Visit: Payer: Self-pay | Admitting: Orthopaedic Surgery

## 2022-06-05 MED ORDER — HYDROCODONE-ACETAMINOPHEN 7.5-325 MG PO TABS: 1.0000 | ORAL_TABLET | Freq: Four times a day (QID) | ORAL | 0 refills | Status: DC | PRN

## 2022-06-05 NOTE — Telephone Encounter (Signed)
Dr. Sanjuan Dame pt, Dr. Hilda Lias is out of the office - spoke w/the patient, he is requesting a refill on Hydrocodone 7.5-325, 28 quantity, every 6 hours PRN for moderate pain to be sent to [facility]

## 2022-06-24 DIAGNOSIS — N400 Enlarged prostate without lower urinary tract symptoms: Secondary | ICD-10-CM | POA: Diagnosis not present

## 2022-06-24 DIAGNOSIS — Z6831 Body mass index (BMI) 31.0-31.9, adult: Secondary | ICD-10-CM | POA: Diagnosis not present

## 2022-06-24 DIAGNOSIS — H548 Legal blindness, as defined in USA: Secondary | ICD-10-CM | POA: Diagnosis not present

## 2022-06-24 DIAGNOSIS — S32010D Wedge compression fracture of first lumbar vertebra, subsequent encounter for fracture with routine healing: Secondary | ICD-10-CM | POA: Diagnosis not present

## 2022-06-24 DIAGNOSIS — E042 Nontoxic multinodular goiter: Secondary | ICD-10-CM | POA: Diagnosis not present

## 2022-06-24 DIAGNOSIS — I10 Essential (primary) hypertension: Secondary | ICD-10-CM | POA: Diagnosis not present

## 2022-06-24 DIAGNOSIS — Z125 Encounter for screening for malignant neoplasm of prostate: Secondary | ICD-10-CM | POA: Diagnosis not present

## 2022-06-24 DIAGNOSIS — R6889 Other general symptoms and signs: Secondary | ICD-10-CM | POA: Diagnosis not present

## 2022-06-25 ENCOUNTER — Ambulatory Visit (INDEPENDENT_AMBULATORY_CARE_PROVIDER_SITE_OTHER): Payer: Medicare HMO | Admitting: Orthopaedic Surgery

## 2022-06-25 ENCOUNTER — Encounter: Payer: Self-pay | Admitting: Orthopaedic Surgery

## 2022-06-25 ENCOUNTER — Other Ambulatory Visit (INDEPENDENT_AMBULATORY_CARE_PROVIDER_SITE_OTHER): Payer: Medicare HMO

## 2022-06-25 DIAGNOSIS — R6889 Other general symptoms and signs: Secondary | ICD-10-CM | POA: Diagnosis not present

## 2022-06-25 DIAGNOSIS — S32010D Wedge compression fracture of first lumbar vertebra, subsequent encounter for fracture with routine healing: Secondary | ICD-10-CM

## 2022-06-25 MED ORDER — HYDROCODONE-ACETAMINOPHEN 7.5-325 MG PO TABS: 1.0000 | ORAL_TABLET | Freq: Four times a day (QID) | ORAL | 0 refills | Status: DC | PRN

## 2022-06-25 NOTE — Progress Notes (Signed)
I feel ok.  He has no back pain.  He has no new trauma.  He is doing well.  X-rays were done of the lumbar spine, reported separately.  Encounter Diagnosis  Name Primary?   Compression fracture of L1 vertebra with routine healing, subsequent encounter Yes   I will see him as needed.  I will refill pain medicine one more time to have on hand.  Call if any problem.  Precautions discussed.  Electronically Signed Darreld Mclean, MD 5/21/20241:38 PM

## 2022-08-01 ENCOUNTER — Telehealth: Payer: Self-pay | Admitting: Orthopaedic Surgery

## 2022-08-01 NOTE — Telephone Encounter (Signed)
[name]   Patient called left voicemail wants Dr. Hilda Lias to call in his pain medicine to [facility] in [place]   HYDROcodone-acetaminophen (NORCO) 7.5-325 MG tablet

## 2022-08-05 MED ORDER — HYDROCODONE-ACETAMINOPHEN 7.5-325 MG PO TABS: 1.0000 | ORAL_TABLET | Freq: Four times a day (QID) | ORAL | 0 refills | Status: DC | PRN

## 2022-08-07 ENCOUNTER — Other Ambulatory Visit: Payer: Self-pay

## 2022-09-09 ENCOUNTER — Telehealth: Payer: Self-pay | Admitting: Orthopaedic Surgery

## 2022-09-09 NOTE — Telephone Encounter (Signed)
Dr. Sanjuan Dame pt - pt lvm requesting a refill on Hydrocodone 7.5-325 to be sent to [facility] in [place]

## 2022-09-10 MED ORDER — HYDROCODONE-ACETAMINOPHEN 7.5-325 MG PO TABS: 1.0000 | ORAL_TABLET | Freq: Four times a day (QID) | ORAL | 0 refills | Status: DC | PRN

## 2022-10-01 ENCOUNTER — Telehealth: Payer: Self-pay | Admitting: Orthopaedic Surgery

## 2022-10-01 MED ORDER — HYDROCODONE-ACETAMINOPHEN 7.5-325 MG PO TABS: 1.0000 | ORAL_TABLET | Freq: Four times a day (QID) | ORAL | 0 refills | Status: DC | PRN

## 2022-10-01 NOTE — Telephone Encounter (Signed)
Dr. Sanjuan Dame pt - pt lvm requesting a refill on Hydrocodone 7.5-325 to be sent to [facility]

## 2022-10-22 ENCOUNTER — Encounter (HOSPITAL_COMMUNITY): Payer: Self-pay | Admitting: *Deleted

## 2022-10-22 ENCOUNTER — Emergency Department (HOSPITAL_COMMUNITY): Payer: Medicare HMO

## 2022-10-22 ENCOUNTER — Other Ambulatory Visit: Payer: Self-pay

## 2022-10-22 ENCOUNTER — Inpatient Hospital Stay (HOSPITAL_COMMUNITY)
Admission: EM | Admit: 2022-10-22 | Discharge: 2022-10-28 | DRG: 177 | Disposition: A | Discharge status: Skilled Nursing Facility | Payer: Medicare HMO | Attending: Internal Medicine | Admitting: Internal Medicine

## 2022-10-22 DIAGNOSIS — Z8249 Family history of ischemic heart disease and other diseases of the circulatory system: Secondary | ICD-10-CM

## 2022-10-22 DIAGNOSIS — N179 Acute kidney failure, unspecified: Secondary | ICD-10-CM | POA: Diagnosis present

## 2022-10-22 DIAGNOSIS — R1312 Dysphagia, oropharyngeal phase: Secondary | ICD-10-CM

## 2022-10-22 DIAGNOSIS — I2489 Other forms of acute ischemic heart disease: Secondary | ICD-10-CM | POA: Diagnosis present

## 2022-10-22 DIAGNOSIS — F17210 Nicotine dependence, cigarettes, uncomplicated: Secondary | ICD-10-CM | POA: Diagnosis present

## 2022-10-22 DIAGNOSIS — R188 Other ascites: Secondary | ICD-10-CM | POA: Diagnosis not present

## 2022-10-22 DIAGNOSIS — J9601 Acute respiratory failure with hypoxia: Secondary | ICD-10-CM | POA: Diagnosis not present

## 2022-10-22 DIAGNOSIS — Z803 Family history of malignant neoplasm of breast: Secondary | ICD-10-CM | POA: Diagnosis not present

## 2022-10-22 DIAGNOSIS — N170 Acute kidney failure with tubular necrosis: Secondary | ICD-10-CM | POA: Diagnosis present

## 2022-10-22 DIAGNOSIS — R279 Unspecified lack of coordination: Secondary | ICD-10-CM | POA: Diagnosis not present

## 2022-10-22 DIAGNOSIS — Z888 Allergy status to other drugs, medicaments and biological substances status: Secondary | ICD-10-CM

## 2022-10-22 DIAGNOSIS — R1013 Epigastric pain: Secondary | ICD-10-CM | POA: Diagnosis not present

## 2022-10-22 DIAGNOSIS — A0839 Other viral enteritis: Secondary | ICD-10-CM | POA: Diagnosis present

## 2022-10-22 DIAGNOSIS — R079 Chest pain, unspecified: Secondary | ICD-10-CM | POA: Diagnosis not present

## 2022-10-22 DIAGNOSIS — M6281 Muscle weakness (generalized): Secondary | ICD-10-CM | POA: Diagnosis not present

## 2022-10-22 DIAGNOSIS — H548 Legal blindness, as defined in USA: Secondary | ICD-10-CM | POA: Diagnosis not present

## 2022-10-22 DIAGNOSIS — Z1152 Encounter for screening for COVID-19: Secondary | ICD-10-CM

## 2022-10-22 DIAGNOSIS — I959 Hypotension, unspecified: Secondary | ICD-10-CM | POA: Diagnosis not present

## 2022-10-22 DIAGNOSIS — R001 Bradycardia, unspecified: Secondary | ICD-10-CM | POA: Diagnosis not present

## 2022-10-22 DIAGNOSIS — N1832 Chronic kidney disease, stage 3b: Secondary | ICD-10-CM | POA: Diagnosis not present

## 2022-10-22 DIAGNOSIS — U071 COVID-19: Principal | ICD-10-CM | POA: Diagnosis present

## 2022-10-22 DIAGNOSIS — R197 Diarrhea, unspecified: Secondary | ICD-10-CM | POA: Diagnosis not present

## 2022-10-22 DIAGNOSIS — I129 Hypertensive chronic kidney disease with stage 1 through stage 4 chronic kidney disease, or unspecified chronic kidney disease: Secondary | ICD-10-CM | POA: Diagnosis present

## 2022-10-22 DIAGNOSIS — Z7982 Long term (current) use of aspirin: Secondary | ICD-10-CM | POA: Diagnosis not present

## 2022-10-22 DIAGNOSIS — E785 Hyperlipidemia, unspecified: Secondary | ICD-10-CM | POA: Diagnosis present

## 2022-10-22 DIAGNOSIS — I10 Essential (primary) hypertension: Secondary | ICD-10-CM | POA: Diagnosis present

## 2022-10-22 DIAGNOSIS — R7989 Other specified abnormal findings of blood chemistry: Secondary | ICD-10-CM | POA: Diagnosis not present

## 2022-10-22 DIAGNOSIS — Z79899 Other long term (current) drug therapy: Secondary | ICD-10-CM

## 2022-10-22 DIAGNOSIS — R1310 Dysphagia, unspecified: Secondary | ICD-10-CM | POA: Diagnosis not present

## 2022-10-22 DIAGNOSIS — J9811 Atelectasis: Secondary | ICD-10-CM | POA: Diagnosis present

## 2022-10-22 DIAGNOSIS — E860 Dehydration: Secondary | ICD-10-CM | POA: Diagnosis not present

## 2022-10-22 DIAGNOSIS — N289 Disorder of kidney and ureter, unspecified: Secondary | ICD-10-CM | POA: Diagnosis not present

## 2022-10-22 DIAGNOSIS — D649 Anemia, unspecified: Secondary | ICD-10-CM | POA: Diagnosis not present

## 2022-10-22 DIAGNOSIS — E876 Hypokalemia: Secondary | ICD-10-CM | POA: Diagnosis not present

## 2022-10-22 DIAGNOSIS — R0789 Other chest pain: Secondary | ICD-10-CM | POA: Diagnosis not present

## 2022-10-22 LAB — BLOOD GAS, VENOUS
Acid-base deficit: 4.7 mmol/L — ABNORMAL HIGH (ref 0.0–2.0)
Bicarbonate: 19.7 mmol/L — ABNORMAL LOW (ref 20.0–28.0)
Drawn by: 53316
O2 Saturation: 93.7 %
Patient temperature: 37.2
pCO2, Ven: 34 mmHg — ABNORMAL LOW (ref 44–60)
pH, Ven: 7.37 (ref 7.25–7.43)
pO2, Ven: 64 mmHg — ABNORMAL HIGH (ref 32–45)

## 2022-10-22 LAB — CBC
HCT: 53.4 % — ABNORMAL HIGH (ref 39.0–52.0)
Hemoglobin: 17.7 g/dL — ABNORMAL HIGH (ref 13.0–17.0)
MCH: 26.5 pg (ref 26.0–34.0)
MCHC: 33.1 g/dL (ref 30.0–36.0)
MCV: 79.8 fL — ABNORMAL LOW (ref 80.0–100.0)
Platelets: 546 10*3/uL — ABNORMAL HIGH (ref 150–400)
RBC: 6.69 MIL/uL — ABNORMAL HIGH (ref 4.22–5.81)
RDW: 17.9 % — ABNORMAL HIGH (ref 11.5–15.5)
WBC: 12.2 10*3/uL — ABNORMAL HIGH (ref 4.0–10.5)
nRBC: 0.4 % — ABNORMAL HIGH (ref 0.0–0.2)

## 2022-10-22 LAB — COMPREHENSIVE METABOLIC PANEL
ALT: 47 U/L — ABNORMAL HIGH (ref 0–44)
AST: 56 U/L — ABNORMAL HIGH (ref 15–41)
Albumin: 5 g/dL (ref 3.5–5.0)
Alkaline Phosphatase: 91 U/L (ref 38–126)
Anion gap: 28 — ABNORMAL HIGH (ref 5–15)
BUN: 73 mg/dL — ABNORMAL HIGH (ref 8–23)
CO2: 17 mmol/L — ABNORMAL LOW (ref 22–32)
Calcium: 9.2 mg/dL (ref 8.9–10.3)
Chloride: 87 mmol/L — ABNORMAL LOW (ref 98–111)
Creatinine, Ser: 8.37 mg/dL — ABNORMAL HIGH (ref 0.61–1.24)
GFR, Estimated: 6 mL/min — ABNORMAL LOW (ref >60)
Glucose, Bld: 112 mg/dL — ABNORMAL HIGH (ref 70–99)
Potassium: 3.3 mmol/L — ABNORMAL LOW (ref 3.5–5.1)
Sodium: 132 mmol/L — ABNORMAL LOW (ref 135–145)
Total Bilirubin: 0.5 mg/dL (ref 0.3–1.2)
Total Protein: 9 g/dL — ABNORMAL HIGH (ref 6.5–8.1)

## 2022-10-22 LAB — TROPONIN I (HIGH SENSITIVITY)
Troponin I (High Sensitivity): 34 ng/L — ABNORMAL HIGH (ref <18)
Troponin I (High Sensitivity): 32 ng/L — ABNORMAL HIGH (ref <18)

## 2022-10-22 LAB — URINALYSIS, ROUTINE W REFLEX MICROSCOPIC
Bilirubin Urine: NEGATIVE
Glucose, UA: NEGATIVE mg/dL
Ketones, ur: NEGATIVE mg/dL
Leukocytes,Ua: NEGATIVE
Nitrite: NEGATIVE
Protein, ur: 100 mg/dL — AB
Specific Gravity, Urine: 1.01 (ref 1.005–1.030)
pH: 5 (ref 5.0–8.0)

## 2022-10-22 LAB — SARS CORONAVIRUS 2 BY RT PCR: SARS Coronavirus 2 by RT PCR: POSITIVE — AB

## 2022-10-22 LAB — LIPASE, BLOOD: Lipase: 89 U/L — ABNORMAL HIGH (ref 11–51)

## 2022-10-22 MED ORDER — PREDNISONE 20 MG PO TABS: 50.0000 mg | ORAL_TABLET | Freq: Every day | ORAL | Status: DC

## 2022-10-22 MED ORDER — METHYLPREDNISOLONE SODIUM SUCC 125 MG IJ SOLR
1.0000 mg/kg | Freq: Two times a day (BID) | INTRAMUSCULAR | Status: DC
  Administered 2022-10-22: 90.625 mg via INTRAVENOUS
  Administered 2022-10-23: 90 mg via INTRAVENOUS
  Administered 2022-10-23 – 2022-10-24 (×3): 90.625 mg via INTRAVENOUS
  Filled 2022-10-22 (×5): qty 2

## 2022-10-22 MED ORDER — OXYCODONE HCL 5 MG PO TABS
5.0000 mg | ORAL_TABLET | ORAL | Status: DC | PRN
  Administered 2022-10-23: 5 mg via ORAL
  Filled 2022-10-22: qty 1

## 2022-10-22 MED ORDER — ACETAMINOPHEN 650 MG RE SUPP: 650.0000 mg | Freq: Four times a day (QID) | RECTAL | Status: DC | PRN

## 2022-10-22 MED ORDER — SODIUM CHLORIDE 0.9 % IV SOLN
100.0000 mg | Freq: Every day | INTRAVENOUS | Status: AC
  Administered 2022-10-23 – 2022-10-24 (×2): 100 mg via INTRAVENOUS
  Filled 2022-10-22: qty 20
  Filled 2022-10-22: qty 100

## 2022-10-22 MED ORDER — LACTATED RINGERS IV SOLN: INTRAVENOUS | Status: DC

## 2022-10-22 MED ORDER — ACETAMINOPHEN 325 MG PO TABS: 650.0000 mg | ORAL_TABLET | Freq: Four times a day (QID) | ORAL | Status: DC | PRN

## 2022-10-22 MED ORDER — ONDANSETRON HCL 4 MG/2ML IJ SOLN
4.0000 mg | Freq: Once | INTRAMUSCULAR | Status: AC
  Administered 2022-10-22: 4 mg via INTRAVENOUS
  Filled 2022-10-22: qty 2

## 2022-10-22 MED ORDER — IPRATROPIUM-ALBUTEROL 0.5-2.5 (3) MG/3ML IN SOLN
3.0000 mL | Freq: Once | RESPIRATORY_TRACT | Status: AC
  Administered 2022-10-22: 3 mL via RESPIRATORY_TRACT
  Filled 2022-10-22: qty 3

## 2022-10-22 MED ORDER — MORPHINE SULFATE (PF) 2 MG/ML IV SOLN: 2.0000 mg | INTRAVENOUS | Status: DC | PRN

## 2022-10-22 MED ORDER — SODIUM CHLORIDE 0.9 % IV SOLN
100.0000 mg | Freq: Every day | INTRAVENOUS | Status: AC
  Administered 2022-10-23: 100 mg via INTRAVENOUS
  Filled 2022-10-22: qty 20

## 2022-10-22 MED ORDER — ONDANSETRON HCL 4 MG/2ML IJ SOLN: 4.0000 mg | Freq: Four times a day (QID) | INTRAMUSCULAR | Status: DC | PRN

## 2022-10-22 MED ORDER — LACTATED RINGERS IV BOLUS
1000.0000 mL | Freq: Once | INTRAVENOUS | Status: AC
  Administered 2022-10-22: 1000 mL via INTRAVENOUS

## 2022-10-22 MED ORDER — SODIUM CHLORIDE 0.9 % IV SOLN
100.0000 mg | Freq: Every day | INTRAVENOUS | Status: AC
  Administered 2022-10-22: 100 mg via INTRAVENOUS
  Filled 2022-10-22: qty 20

## 2022-10-22 MED ORDER — SODIUM CHLORIDE 0.9 % IV SOLN
200.0000 mg | Freq: Once | INTRAVENOUS | Status: DC
  Filled 2022-10-22: qty 40

## 2022-10-22 MED ORDER — HEPARIN SODIUM (PORCINE) 5000 UNIT/ML IJ SOLN
5000.0000 [IU] | Freq: Three times a day (TID) | INTRAMUSCULAR | Status: DC
  Administered 2022-10-22 – 2022-10-28 (×16): 5000 [IU] via SUBCUTANEOUS
  Filled 2022-10-22 (×17): qty 1

## 2022-10-22 MED ORDER — ONDANSETRON HCL 4 MG PO TABS: 4.0000 mg | ORAL_TABLET | Freq: Four times a day (QID) | ORAL | Status: DC | PRN

## 2022-10-22 MED ORDER — SODIUM CHLORIDE 0.9 % IV SOLN
100.0000 mg | Freq: Every day | INTRAVENOUS | Status: DC
  Filled 2022-10-22: qty 20

## 2022-10-22 NOTE — ED Notes (Signed)
AC called to bring down pt's remdesivir

## 2022-10-22 NOTE — ED Notes (Signed)
Patient transported to CT 

## 2022-10-22 NOTE — ED Notes (Addendum)
Upon assessment, pt's O2 sat was 88% at rest. Placed pt on 2L [place] and O2 sat came up to 90%. Pt now on 3L [place] with O2 sat at 93-94%. Pt comfortable in the bed with no complaints at this time  Pt aware we need a urine sample and updated to their disposition

## 2022-10-22 NOTE — ED Triage Notes (Signed)
Pt with diarrhea since Sunday, watery per pt.  Not ate in the past three days. C/o cramping to legs since Sunday. Generalized chest pain since Sunday.

## 2022-10-22 NOTE — ED Notes (Signed)
Lab at bedside

## 2022-10-22 NOTE — ED Provider Notes (Signed)
[place] EMERGENCY DEPARTMENT AT [facility] Provider Note  CSN: [id number] Arrival date & time: 10/22/22 1519  Chief Complaint(s) Diarrhea and Chest Pain  HPI [name] is a 67 y.o. male with PMH alcohol abuse, HTN, HLD, blindness who presents emergency department for evaluation of myalgias, chest pain, diarrhea and decreased p.o. intake.  Symptoms have been worsening over the last 3 days.  Denies associated shortness of breath, abdominal pain, vomiting or other systemic symptoms.   Past Medical History Past Medical History:  Diagnosis Date   Alcohol abuse    Blind    Hyperlipidemia    Hypertension    Patient Active Problem List   Diagnosis Date Noted   Colon cancer screening 05/25/2020   Alcohol abuse with intoxication (HCC)    Anemia due to vitamin B12 deficiency    Right leg numbness 05/07/2017   Alcohol abuse 05/07/2017   Hypokalemia 05/07/2017   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   OBESITY 01/08/2007   Hyperlipidemia 12/25/2005   TOBACCO ABUSE 12/25/2005   BLINDNESS, LEGAL, [place] DEFINITION 12/25/2005   Essential hypertension 12/25/2005   TRIGGER FINGER 12/25/2005   Home Medication(s) Prior to Admission medications   Medication Sig Start Date End Date Taking? Authorizing Provider  amLODipine (NORVASC) 10 MG tablet Take 10 mg by mouth daily.    [provider]  aspirin 81 MG chewable tablet Chew by mouth daily.    [provider]  atorvastatin (LIPITOR) 20 MG tablet Take 20 mg by mouth daily. Patient not taking: Reported on 04/30/2022    [provider]  HYDROcodone-acetaminophen (NORCO) 7.5-325 MG tablet Take 1 tablet by mouth every 6 (six) hours as needed for moderate pain. 10/01/22   Darreld Mclean, MD  naproxen (NAPROSYN) 500 MG tablet Take 1 tablet (500 mg total) by mouth 2 (two) times daily as needed. Patient not taking: Reported on 04/30/2022 04/14/22   Particia Nearing, PA-C   Oxymetazoline HCl (NASAL SPRAY NA) Place 1 spray into the nose daily as needed (Congestion).    [provider]  sildenafil (VIAGRA) 100 MG tablet Take 100 mg by mouth daily as needed for erectile dysfunction. 02/23/20   [provider]  valsartan-hydrochlorothiazide (DIOVAN-HCT) 160-25 MG per tablet Take 1 tablet by mouth daily.    [provider]                                                                                                                                    Past Surgical History Past Surgical History:  Procedure Laterality Date   CATARACT EXTRACTION W/PHACO Right 09/26/2014   Procedure: CATARACT EXTRACTION PHACO AND INTRAOCULAR LENS PLACEMENT RIGHT EYE CDE=18.56;  Surgeon: Gemma Payor, MD;  Location: [facility];  Service: Ophthalmology;  Laterality: Right;   COLONOSCOPY WITH PROPOFOL N/A 07/07/2020   Procedure: COLONOSCOPY WITH PROPOFOL;  Surgeon: Lanelle Bal, DO;  Location: [facility] END[facility] SUITE;  Service: Endoscopy;  Laterality: N/A;  10:00am   POLYPECTOMY  07/07/2020   Procedure: POLYPECTOMY;  Surgeon: Lanelle Bal, DO;  Location: [facility] END[facility] SU[facility];  Service: Endoscopy;;   ROTATOR CUFF REPAIR Left 2012   TOOTH EXTRACTION N/A 1996   Family History Family History  Problem Relation Age of Onset   Cancer Sister 3       breast ca   Hypertension Sister    Hypertension Sister    Hypertension Sister    Hypertension Sister    Hypertension Brother    Hypertension Brother    Hypertension Brother    Colon cancer Neg Hx     Social History Social History   Tobacco Use   Smoking status: Every Day    Current packs/day: 0.50    Average packs/day: 0.5 packs/day for 20.0 years (10.0 ttl pk-yrs)    Types: Cigarettes   Smokeless tobacco: Never  Vaping Use   Vaping status: Never Used  Substance Use Topics   Alcohol use: Not Currently    Alcohol/week: 1.0 standard drink of alcohol    Types: 1 Glasses of wine per week    Comment: used to drink heavily.  Last drank years ago.    Drug use: No   Allergies Cortisone  Review of Systems Review of Systems  Constitutional:  Positive for fatigue.  Respiratory:  Positive for chest tightness.   Cardiovascular:  Positive for chest pain.  Gastrointestinal:  Positive for diarrhea and nausea.    Physical Exam Vital Signs  I have reviewed the triage vital signs BP 90/80   Pulse 79   Temp 98.7 F (37.1 C) (Oral)   Resp 18   Ht 5\' 10"  (1.778 m)   Wt 90.4 kg   SpO2 100%   BMI 28.61 kg/m   Physical Exam Constitutional:      General: He is not in acute distress.    Appearance: Normal appearance.  HENT:     Head: Normocephalic and atraumatic.     Nose: No congestion or rhinorrhea.  Eyes:     General:        Right eye: No discharge.        Left eye: No discharge.     Extraocular Movements: Extraocular movements intact.     Pupils: Pupils are equal, round, and reactive to light.  Cardiovascular:     Rate and Rhythm: Normal rate and regular rhythm.     Heart sounds: No murmur heard. Pulmonary:     Effort: No respiratory distress.     Breath sounds: Wheezing present. No rales.  Abdominal:     General: There is no distension.     Tenderness: There is no abdominal tenderness.  Musculoskeletal:        General: Normal range of motion.     Cervical back: Normal range of motion.  Skin:    General: Skin is warm and dry.  Neurological:     General: No focal deficit present.     Mental Status: He is alert.     ED Results and Treatments Labs (all labs ordered are listed, but only abnormal results are displayed) Labs Reviewed  SARS CORONAVIRUS 2 BY RT PCR - Abnormal; Notable for the following components:      Result Value   SARS Coronavirus 2 by RT PCR POSITIVE (*)    All other components within normal limits  LIPASE, BLOOD  COMPREHENSIVE METABOLIC PANEL  CBC  URINALYSIS, ROUTINE W REFLEX MICROSCOPIC  TROPONIN I (HIGH SENSITIVITY)  TROPONIN I (HIGH SENSITIVITY)                                                                                                                           Radiology DG Chest 2 View  Result Date: 10/22/2022 CLINICAL DATA:  Chest pain. EXAM: CHEST - 2 VIEW COMPARISON:  10/31/2019. FINDINGS: Bilateral lung fields are clear. Bilateral costophrenic angles are clear. Normal cardio-mediastinal silhouette. No acute osseous abnormalities. The soft tissues are within normal limits. IMPRESSION: No active cardiopulmonary disease. Electronically Signed   By: Jules Schick M.D.   On: 10/22/2022 16:49    Pertinent labs & imaging results that were available during my care of the patient were reviewed by me and considered in my medical decision making (see MDM for details).  Medications Ordered in ED Medications  ipratropium-albuterol (DUONEB) 0.5-2.5 (3) MG/3ML nebulizer solution 3 mL (has no administration in time range)  lactated ringers bolus 1,000 mL (1,000 mLs Intravenous New Bag/Given 10/22/22 1802)  ondansetron (ZOFRAN) injection 4 mg (4 mg Intravenous Given 10/22/22 1758)                                                                                                                                     Procedures .Critical Care  Performed by: Glendora Score, MD Authorized by: Glendora Score, MD   Critical care provider statement:    Critical care time (minutes):  30   Critical care was necessary to treat or prevent imminent or life-threatening deterioration of the following conditions:  Dehydration and renal failure   Critical care was time spent personally by me on the following activities:  Development of treatment plan with patient or surrogate, discussions with consultants, evaluation of patient's response to treatment, examination of patient, ordering and review of laboratory studies, ordering and review of radiographic studies, ordering and performing treatments and interventions, pulse oximetry, re-evaluation of patient's condition and review  of old charts   (including critical care time)  Medical Decision Making / ED Course   This patient presents to the ED for concern of myalgias, chest pain fatigue, this involves an extensive number of treatment options, and is a complaint that carries with it a high risk of complications and morbidity.  The differential diagnosis includes COVID-19, unspecified viral illness, electrolyte abnormality, dehydration, ACS, pneumonia  MDM: Patient seen emergency room for evaluation of multiple complaints as described above.  Physical exam with some mild wheezing bilaterally but is otherwise unremarkable.  Laboratory evaluation is concerning with a new significant BUN elevation of 73, creatinine 8.37 up from a baseline of 1.5.  Leukocytosis to 12.2, hemoglobin 17.7, potassium 3.3, sodium 132 pH 7.37, high-sensitivity troponin 32.  Patient is COVID-positive which likely accounts for the beginning of his symptoms and reason for decreased p.o. intake.  CT abdomen pelvis showing no obstructive uropathy.  Chest x-ray unremarkable.  I spoke with Dr. Allena Katz of nephrology who is recommending aggressive fluid resuscitation with lactated Ringer's at 150 cc/h and monitoring for improvement.  Will evaluate the patient if creatinine not improving.  Patient then admitted for COVID-19 and severe AKI.   Additional history obtained: -Additional history obtained from multiple family members -External records from outside source obtained and reviewed including: Chart review including previous notes, labs, imaging, consultation notes   Lab Tests: -I ordered, reviewed, and interpreted labs.   The pertinent results include:   Labs Reviewed  SARS CORONAVIRUS 2 BY RT PCR - Abnormal; Notable for the following components:      Result Value   SARS Coronavirus 2 by RT PCR POSITIVE (*)    All other components within normal limits  LIPASE, BLOOD  COMPREHENSIVE METABOLIC PANEL  CBC  URINALYSIS, ROUTINE W REFLEX MICROSCOPIC   TROPONIN I (HIGH SENSITIVITY)  TROPONIN I (HIGH SENSITIVITY)      EKG   EKG Interpretation Date/Time:  Tuesday October 22 2022 15:41:03 EDT Ventricular Rate:  80 PR Interval:  164 QRS Duration:  90 QT Interval:  396 QTC Calculation: 456 R Axis:   74  Text Interpretation: Normal sinus rhythm Right atrial enlargement Borderline ECG When compared with ECG of 28-Mar-2018 18:27, PREVIOUS ECG IS PRESENT Confirmed by Khailee Mick ([id number]) on 10/22/2022 5:03:10 PM         Imaging Studies ordered: I ordered imaging studies including chest x-ray, CTAP I independently visualized and interpreted imaging. I agree with the radiologist interpretation   Medicines ordered and prescription drug management: Meds ordered this encounter  Medications   lactated ringers bolus 1,000 mL   ondansetron (ZOFRAN) injection 4 mg   ipratropium-albuterol (DUONEB) 0.5-2.5 (3) MG/3ML nebulizer solution 3 mL    -I have reviewed the patients home medicines and have made adjustments as needed  Critical interventions Fluid resuscitation, nephrology consultation  Consultations Obtained: I requested consultation with the nephrologist on-call Dr. Allena Katz,  and discussed lab and imaging findings as well as pertinent plan - they recommend: Aggressive fluid resuscitation, repeat electrolytes   Cardiac Monitoring: The patient was maintained on a cardiac monitor.  I personally viewed and interpreted the cardiac monitored which showed an underlying rhythm of: NSR  Social Determinants of Health:  Factors impacting patients care include: none   Reevaluation: After the interventions noted above, I reevaluated the patient and found that they have :improved  Co morbidities that complicate the patient evaluation  Past Medical History:  Diagnosis Date   Alcohol abuse    Blind    Hyperlipidemia    Hypertension       Dispostion: I considered admission for this patient, and given severe AKI in the setting  of COVID-19 patient require hospital admission     Final Clinical Impression(s) / ED Diagnoses Final diagnoses:  None     @PCDICTATION @    Glendora Score, MD 10/23/22 0004

## 2022-10-22 NOTE — ED Notes (Signed)
ED Provider at bedside. 

## 2022-10-23 ENCOUNTER — Inpatient Hospital Stay (HOSPITAL_COMMUNITY): Payer: Medicare HMO

## 2022-10-23 DIAGNOSIS — R079 Chest pain, unspecified: Secondary | ICD-10-CM | POA: Diagnosis not present

## 2022-10-23 DIAGNOSIS — J9601 Acute respiratory failure with hypoxia: Secondary | ICD-10-CM

## 2022-10-23 DIAGNOSIS — E876 Hypokalemia: Secondary | ICD-10-CM

## 2022-10-23 DIAGNOSIS — N179 Acute kidney failure, unspecified: Secondary | ICD-10-CM | POA: Diagnosis not present

## 2022-10-23 DIAGNOSIS — E860 Dehydration: Secondary | ICD-10-CM

## 2022-10-23 DIAGNOSIS — U071 COVID-19: Secondary | ICD-10-CM | POA: Diagnosis not present

## 2022-10-23 DIAGNOSIS — I10 Essential (primary) hypertension: Secondary | ICD-10-CM

## 2022-10-23 DIAGNOSIS — R7989 Other specified abnormal findings of blood chemistry: Secondary | ICD-10-CM | POA: Diagnosis not present

## 2022-10-23 LAB — C-REACTIVE PROTEIN: CRP: 4.3 mg/dL — ABNORMAL HIGH (ref <1.0)

## 2022-10-23 LAB — PROCALCITONIN: Procalcitonin: 2.41 ng/mL

## 2022-10-23 LAB — ECHOCARDIOGRAM COMPLETE
AR max vel: 2.32 cm2
AV Peak grad: 12.3 mmHg
Ao pk vel: 1.75 m/s
Area-P 1/2: 2.83 cm2
Height: 70 in
S' Lateral: 2.6 cm
Weight: 3195.79 [oz_av]

## 2022-10-23 LAB — ETHANOL: Alcohol, Ethyl (B): <10 mg/dL (ref <10)

## 2022-10-23 LAB — FERRITIN: Ferritin: 503 ng/mL — ABNORMAL HIGH (ref 24–336)

## 2022-10-23 LAB — D-DIMER, QUANTITATIVE: D-Dimer, Quant: 0.85 ug{FEU}/mL — ABNORMAL HIGH (ref 0.00–0.50)

## 2022-10-23 LAB — TROPONIN I (HIGH SENSITIVITY): Troponin I (High Sensitivity): 23 ng/L — ABNORMAL HIGH (ref <18)

## 2022-10-23 MED ORDER — PHENOL 1.4 % MT LIQD
1.0000 | OROMUCOSAL | Status: DC | PRN
  Administered 2022-10-23: 1 via OROMUCOSAL
  Filled 2022-10-23: qty 177

## 2022-10-23 MED ORDER — INFLUENZA VAC A&B SURF ANT ADJ 0.5 ML IM SUSY
0.5000 mL | PREFILLED_SYRINGE | INTRAMUSCULAR | Status: DC
  Filled 2022-10-23: qty 0.5

## 2022-10-23 MED ORDER — IPRATROPIUM-ALBUTEROL 0.5-2.5 (3) MG/3ML IN SOLN
3.0000 mL | Freq: Two times a day (BID) | RESPIRATORY_TRACT | Status: DC
  Administered 2022-10-23 – 2022-10-24 (×2): 3 mL via RESPIRATORY_TRACT
  Filled 2022-10-23 (×4): qty 3

## 2022-10-23 MED ORDER — BUDESONIDE 0.5 MG/2ML IN SUSP
0.5000 mg | Freq: Two times a day (BID) | RESPIRATORY_TRACT | Status: DC
  Administered 2022-10-23 – 2022-10-24 (×2): 0.5 mg via RESPIRATORY_TRACT
  Filled 2022-10-23 (×5): qty 2

## 2022-10-23 MED ORDER — IPRATROPIUM-ALBUTEROL 0.5-2.5 (3) MG/3ML IN SOLN
3.0000 mL | Freq: Four times a day (QID) | RESPIRATORY_TRACT | Status: DC
  Administered 2022-10-23: 3 mL via RESPIRATORY_TRACT
  Filled 2022-10-23 (×2): qty 3

## 2022-10-23 MED ORDER — POTASSIUM CHLORIDE CRYS ER 20 MEQ PO TBCR
40.0000 meq | EXTENDED_RELEASE_TABLET | Freq: Once | ORAL | Status: AC
  Administered 2022-10-23: 40 meq via ORAL
  Filled 2022-10-23: qty 2

## 2022-10-23 MED ORDER — ARFORMOTEROL TARTRATE 15 MCG/2ML IN NEBU
15.0000 ug | INHALATION_SOLUTION | Freq: Two times a day (BID) | RESPIRATORY_TRACT | Status: DC
  Administered 2022-10-23 – 2022-10-24 (×2): 15 ug via RESPIRATORY_TRACT
  Filled 2022-10-23 (×5): qty 2

## 2022-10-23 MED ORDER — CHLORHEXIDINE GLUCONATE CLOTH 2 % EX PADS
6.0000 | MEDICATED_PAD | Freq: Every day | CUTANEOUS | Status: DC
  Administered 2022-10-23 – 2022-10-28 (×6): 6 via TOPICAL

## 2022-10-23 NOTE — Assessment & Plan Note (Signed)
-   Troponin 34 and 32 - Likely demand ischemia in the setting of hypoxia requiring 3 L nasal cannula - No ischemic changes on EKG - Monitor on telemetry

## 2022-10-23 NOTE — Assessment & Plan Note (Signed)
-   Creatinine 8.37 - This is up from 1-1/2 - Hold ARB, hydrochlorothiazide - Secondary to dehydration - Nephro to see if patient does not start trending in the correct direction with fluids - Patient is still making urine - Continue to monitor

## 2022-10-23 NOTE — ED Notes (Signed)
Lab at bedside

## 2022-10-23 NOTE — Assessment & Plan Note (Signed)
-   Soft blood pressure in the ED - Holding Norvasc, ARB, hydrochlorothiazide

## 2022-10-23 NOTE — Progress Notes (Signed)
PROGRESS NOTE  [name] GNF:[id number] DOB: [birth year] DOA: 10/22/2022 PCP: Mirna Mires, MD  Brief History:  67 year old male with a history of blindness, hypertension, hyperlipidemia, tobacco abuse, alcohol abuse presenting with 4-day history of chest pain, coughing, myalgias, and loose stools.  The patient had some nausea without emesis.  He had some exertional dyspnea.  He states that the chest pain is worsened with coughing or deep breaths.  He denies any fever, chills, headache, neck pain, abdominal pain, hematochezia, melena. In the ED, the patient had a low-grade temperature 9 9.0 F.  He was hemodynamically stable.  Oxygen saturation was 89% on room air.  The patient was placed on 3 L with saturation 93-95%.  WBC 12.2, hemoglobin 17.7, platelets 546.  Sodium 132, potassium 2.8, bicarbonate 17, serum creatinine 8.37. COVID-19 PCR was positive.  CT of the abdomen and pelvis showed bibasilar atelectasis.  There was scattered air-fluid levels throughout the distal small bowel and proximal colon consistent with enteritis.  There is no high-grade small bowel obstruction.  There is no ureteral calculus.  The patient was started on IV fluids and IV steroids.   Assessment/Plan: Acute respiratory failure with hypoxia -Presented with some dyspnea and hypoxia -secondary to COVID-19 infection -Stable on 3 L nasal cannula -Start IV Solu-Medrol -Check inflammatory markers -Plan 3 days of remdesivir  Diarrhea -Check C. Difficile -Stool pathogen panel -Likely secondary to COVID-19  Acute kidney injury -Baseline creatinine 1.3-1.6 -Presented with serum creatinine 8.37 -Secondary to volume depletion -Continue IV fluids -Renal ultrasound  Elevated troponin -Secondary to demand ischemia -Echo  -Troponin trend is flat -Personally reviewed EKG--sinus rhythm, no ST-T wave change.  Essential hypertension -Holding amlodipine, ARB, HCTZ secondary to soft blood  pressure  Hypokalemia -Replete -Check magnesium  Tobacco abuse -Tobacco cessation discussed           Family Communication:  daughter udpated 9/18  Consultants:  none  Code Status:  FULL   DVT Prophylaxis:  [place] Heparin    Procedures: As Listed in Progress Note Above  Antibiotics: None    Total time spent 50 minutes.  Greater than 50% spent face to face counseling and coordinating care.    Subjective: Patient complain of a nonproductive cough.  He has some shortness of breath.  He denies any vomiting, headache, neck pain.  He has a nonproductive cough.  He has loose stool without hematochezia or melena.  Objective: Vitals:   10/23/22 0730 10/23/22 0815 10/23/22 0830 10/23/22 0900  BP: 118/74  (!) 101/56   Pulse: 64 65 70   Resp: 18 15 15    Temp:   98.1 F (36.7 C) 98.1 F (36.7 C)  TempSrc:   Oral Oral  SpO2: 96% 97% 96%   Weight:    90.6 kg  Height:    5\' 10"  (1.778 m)    Intake/Output Summary (Last 24 hours) at 10/23/2022 0945 Last data filed at 10/23/2022 0314 Gross per 24 hour  Intake 2100 ml  Output 750 ml  Net 1350 ml   Weight change:  Exam:  General:  Pt is alert, follows commands appropriately, not in acute distress HEENT: No icterus, No thrush, No neck mass, [place]/AT Cardiovascular: RRR, S1/S2, no rubs, no gallops Respiratory: Bilateral rales.  Bilateral wheeze. Abdomen: Soft/+BS, non tender, non distended, no guarding Extremities: No edema, No lymphangitis, No petechiae, No rashes, no synovitis   Data Reviewed: I have personally reviewed following labs and imaging studies Basic  Metabolic Panel: Recent Labs  Lab 10/22/22 1735 10/23/22 0340  NA 132* 132*  K 3.3* 2.8*  CL 87* 97*  CO2 17* 17*  GLUCOSE 112* 124*  BUN 73* 73*  CREATININE 8.37* 6.07*  CALCIUM 9.2 8.3*  MG  --  2.7*   Liver Function Tests: Recent Labs  Lab 10/22/22 1735 10/23/22 0340  AST 56* 44*  ALT 47* 34  ALKPHOS 91 68  BILITOT 0.5 0.5  PROT 9.0* 6.6   ALBUMIN 5.0 3.6   Recent Labs  Lab 10/22/22 1735  LIPASE 89*   No results for input(s): "AMMONIA" in the last 168 hours. Coagulation Profile: No results for input(s): "INR", "PROTIME" in the last 168 hours. CBC: Recent Labs  Lab 10/22/22 1735 10/23/22 0340  WBC 12.2* 10.5  NEUTROABS  --  9.4*  HGB 17.7* 15.5  HCT 53.4* 45.5  MCV 79.8* 78.4*  PLT 546* 442*   Cardiac Enzymes: No results for input(s): "CKTOTAL", "CKMB", "CKMBINDEX", "TROPONINI" in the last 168 hours. BNP: Invalid input(s): "POCBNP" CBG: No results for input(s): "GLUCAP" in the last 168 hours. HbA1C: No results for input(s): "HGBA1C" in the last 72 hours. Urine analysis:    Component Value Date/Time   COLORURINE YELLOW 10/22/2022 2144   APPEARANCEUR HAZY (A) 10/22/2022 2144   LABSPEC 1.010 10/22/2022 2144   PHURINE 5.0 10/22/2022 2144   GLUCOSEU NEGATIVE 10/22/2022 2144   HGBUR LARGE (A) 10/22/2022 2144   BILIRUBINUR NEGATIVE 10/22/2022 2144   BILIRUBINUR negative 10/31/2019 1603   KETONESUR NEGATIVE 10/22/2022 2144   PROTEINUR 100 (A) 10/22/2022 2144   UROBILINOGEN 0.2 10/31/2019 1603   NITRITE NEGATIVE 10/22/2022 2144   LEUKOCYTESUR NEGATIVE 10/22/2022 2144   Sepsis Labs: @LABRCNTIP (procalcitonin:4,lacticidven:4) ) Recent Results (from the past 240 hour(s))  SARS Coronavirus 2 by RT PCR (hospital order, performed in [facility] hospital lab) *cepheid single result test* Anterior Nasal Swab     Status: Abnormal   Collection Time: 10/22/22  3:41 PM   Specimen: Anterior Nasal Swab  Result Value Ref Range Status   SARS Coronavirus 2 by RT PCR POSITIVE (A) NEGATIVE Final    Comment: (NOTE) SARS-CoV-2 target nucleic acids are DETECTED  SARS-CoV-2 RNA is generally detectable in upper respiratory specimens  during the acute phase of infection.  Positive results are indicative  of the presence of the identified virus, but do not rule out bacterial infection or co-infection with other pathogens  not detected by the test.  Clinical correlation with patient history and  other diagnostic information is necessary to determine patient infection status.  The expected result is negative.  Fact Sheet for Patients:   RoadLapTop.co.za   Fact Sheet for Healthcare Providers:   http://kim-miller.com/    This test is not yet approved or cleared by the [place] FDA and  has been authorized for detection and/or diagnosis of SARS-CoV-2 by FDA under an Emergency Use Authorization (EUA).  This EUA will remain in effect (meaning this test can be used) for the duration of  the COVID-19 declaration under Section 564(b)(1)  of the Act, 21 U.S.C. section 360-bbb-3(b)(1), unless the authorization is terminated or revoked sooner.   Performed at [facility], [address] [place], [place] [address]      Scheduled Meds:  Chlorhexidine Gluconate Cloth  6 each Topical Q0600   heparin  5,000 Units Subcutaneous Q8H   [START ON 10/24/2022] influenza vaccine adjuvanted  0.5 mL Intramuscular Tomorrow-1000   methylPREDNISolone (SOLU-MEDROL) injection  1 mg/kg Intravenous Q12H  Followed by   Melene Muller ON 10/26/2022] predniSONE  50 mg Oral Daily   potassium chloride  40 mEq Oral Once   Continuous Infusions:  lactated ringers 150 mL/hr at 10/23/22 0102   remdesivir 100 mg in sodium chloride 0.9 % 100 mL IVPB      Procedures/Studies: CT ABDOMEN PELVIS WO CONTRAST  Result Date: 10/22/2022 CLINICAL DATA:  Epigastric pain, renal insufficiency EXAM: CT ABDOMEN AND PELVIS WITHOUT CONTRAST TECHNIQUE: Multidetector CT imaging of the abdomen and pelvis was performed following the standard protocol without IV contrast. RADIATION DOSE REDUCTION: This exam was performed according to the departmental dose-optimization program which includes automated exposure control, adjustment of the mA and/or kV according to patient size and/or use of iterative reconstruction  technique. COMPARISON:  None Available. FINDINGS: Lower chest: Subsegmental atelectasis or scarring within the lingula. No acute pleural or parenchymal lung disease. Hepatobiliary: Unremarkable unenhanced appearance of the liver and gallbladder. Pancreas: Unremarkable unenhanced appearance. Spleen: Unremarkable unenhanced appearance. Adrenals/Urinary Tract: No urinary tract calculi or obstructive uropathy within either kidney. Exophytic 2.9 x 2.3 cm hypodensity lower pole left kidney measures 16 HU, compatible with a cyst. No specific imaging follow-up is recommended. The adrenals and bladder are unremarkable. Stomach/Bowel: No evidence of high-grade bowel obstruction. Scattered gas fluid levels are seen throughout the jejunum, ileum, and proximal colon, which may reflect ileus or enteritis. Normal appendix right lower quadrant. No bowel wall thickening or inflammatory change. Vascular/Lymphatic: Aortic atherosclerosis. No enlarged abdominal or pelvic lymph nodes. Reproductive: Prostate is unremarkable. Other: No free fluid or free intraperitoneal gas. No abdominal wall hernia. Musculoskeletal: No acute or destructive bony abnormalities. Chronic compression deformity superior endplate L1 and T11 vertebral bodies. Reconstructed images demonstrate no additional findings. IMPRESSION: 1. Scattered gas fluid levels throughout the distal small bowel and proximal colon, compatible with ileus or enteritis. No evidence of high-grade bowel obstruction. 2. No evidence of urinary tract calculi or obstructive uropathy. 3.  Aortic Atherosclerosis (ICD10-I70.0). Electronically Signed   By: Sharlet Salina M.D.   On: 10/22/2022 20:49   DG Chest 2 View  Result Date: 10/22/2022 CLINICAL DATA:  Chest pain. EXAM: CHEST - 2 VIEW COMPARISON:  10/31/2019. FINDINGS: Bilateral lung fields are clear. Bilateral costophrenic angles are clear. Normal cardio-mediastinal silhouette. No acute osseous abnormalities. The soft tissues are within  normal limits. IMPRESSION: No active cardiopulmonary disease. Electronically Signed   By: Jules Schick M.D.   On: 10/22/2022 16:49    Catarina Hartshorn, DO  [facility]  If 7PM-7AM, please contact night-coverage [email or web address] Password [email or web address] 10/23/2022, 9:45 AM   LOS: 1 day

## 2022-10-23 NOTE — Assessment & Plan Note (Signed)
-   COVID-positive - Patient is up-to-date on COVID shots and boosters - 4 days of symptoms - Consult pharmacy for remdesivir - Continue oxygen - Continue steroids - Continue to monitor

## 2022-10-23 NOTE — Assessment & Plan Note (Signed)
-   Secondary to GI losses and poor p.o. intake - Creatinine up to 8.37 - BUN 73 - 2 L of fluid given in the ED - Continue LR at 150 mL/h - ED physician spoke with nephrology and they said if the creatinine does not start to trend down they will see him - Continue to monitor

## 2022-10-23 NOTE — Progress Notes (Signed)
Echocardiogram 2D Echocardiogram is incomplete due to patient's refusal.   [name] 10/23/2022, 12:15 PM

## 2022-10-23 NOTE — TOC CM/SW Note (Signed)
Transition of Care [facility]) - Inpatient Brief Assessment   Patient Details  Name: [name] MRN: [id number] Date of Birth: August 25, 1955  Transition of [facility]) CM/SW Contact:    Villa Herb, LCSWA Phone Number: 10/23/2022, 2:07 PM   Clinical Narrative: Transition of Care Department [facility]) has reviewed patient and no TOC needs have been identified at this time. We will continue to monitor patient advancement through interdisciplinary progression rounds. If new patient transition needs arise, please place a TOC consult.  Transition of Care Asessment: Insurance and Status: Insurance coverage has been reviewed Patient has primary care physician: Yes Home environment has been reviewed: from home Prior level of function:: independent Prior/Current Home Services: No current home services Social Determinants of Health Reivew: SDOH reviewed no interventions necessary Readmission risk has been reviewed: Yes Transition of care needs: no transition of care needs at this time

## 2022-10-23 NOTE — Hospital Course (Addendum)
67 year old male with a history of blindness, hypertension, hyperlipidemia, tobacco abuse, alcohol abuse presenting with 4-day history of chest pain, coughing, myalgias, and loose stools.  The patient had some nausea without emesis.  He had some exertional dyspnea.  He states that the chest pain is worsened with coughing or deep breaths.  He denies any fever, chills, headache, neck pain, abdominal pain, hematochezia, melena. In the ED, the patient had a low-grade temperature 9 9.0 F.  He was hemodynamically stable.  Oxygen saturation was 89% on room air.  The patient was placed on 3 L with saturation 93-95%.  WBC 12.2, hemoglobin 17.7, platelets 546.  Sodium 132, potassium 2.8, bicarbonate 17, serum creatinine 8.37. COVID-19 PCR was positive.  CT of the abdomen and pelvis showed bibasilar atelectasis.  There was scattered air-fluid levels throughout the distal small bowel and proximal colon consistent with enteritis.  There is no high-grade small bowel obstruction.  There is no ureteral calculus.  The patient was started on IV fluids and IV steroids. He gradually improved albeit slow.  He was continued on IV fluids at 150cc/hr.  His renal function gradually improved and returned to baseline.  He was weaned to RA and remained stable.  He will finish 3 more days prednisone after d/c.

## 2022-10-23 NOTE — ED Notes (Signed)
Provider at bedside

## 2022-10-23 NOTE — Plan of Care (Signed)

## 2022-10-23 NOTE — ED Notes (Signed)
Pt states that he does not want any family members to know that he is here nor give out any information.

## 2022-10-23 NOTE — H&P (Signed)
History and Physical    Patient: [name] WUJ:[id number] DOB: 1955/09/10 DOA: 10/22/2022 DOS: the patient was seen and examined on 10/23/2022 PCP: Mirna Mires, MD  Patient coming from: Home  Chief Complaint:  Chief Complaint  Patient presents with   Diarrhea   Chest Pain   HPI: [name] is a 67 y.o. male with medical history significant of blindness, hyperlipidemia, hypertension, presents to the ED with a chief complaint of being very sick.  Patient reports that he has had a headache, chest pain, leg aches, diarrhea, and more.  He reports the symptoms started about 4 days ago.  He reports bowel and urine incontinence due to such frequent diarrhea.  He reports diarrhea was happening about every hour.  There was no melena, no hematochezia.  He reports the consistency was like water.  He has been nauseous but had no vomiting.  Patient reports he has not had any p.o. intake in last 4 days.  He has had some shortness of breath and a cough.  The cough is dry.  Patient reports that his leg aches are like cramping.  His headache feels like a pressure.  His chest pain is worse with coughing and deep inspiration.  He had 1 sick contact but is not sure if that person had COVID.  Patient has been vaccinated for COVID and had boosters up until this year.  He was planning to get his next booster October 1.  Patient cannot tell if he feels better after treatment in the ED or not since he has just been trying to sleep.  Patient is a current smoker but declines nicotine patch at this time.  He very seldomly drinks.  Patient is full code. Review of Systems: As mentioned in the history of present illness. All other systems reviewed and are negative. Past Medical History:  Diagnosis Date   Alcohol abuse    Blind    Hyperlipidemia    Hypertension    Past Surgical History:  Procedure Laterality Date   CATARACT EXTRACTION W/PHACO Right 09/26/2014   Procedure: CATARACT EXTRACTION PHACO AND  INTRAOCULAR LENS PLACEMENT RIGHT EYE CDE=18.56;  Surgeon: Gemma Payor, MD;  Location: [facility];  Service: Ophthalmology;  Laterality: Right;   COLONOSCOPY WITH PROPOFOL N/A 07/07/2020   Procedure: COLONOSCOPY WITH PROPOFOL;  Surgeon: Lanelle Bal, DO;  Location: [facility];  Service: Endoscopy;  Laterality: N/A;  10:00am   POLYPECTOMY  07/07/2020   Procedure: POLYPECTOMY;  Surgeon: Lanelle Bal, DO;  Location: [facility];  Service: Endoscopy;;   ROTATOR CUFF REPAIR Left 2012   TOOTH EXTRACTION N/A 1996   Social History:  reports that he has been smoking cigarettes. He has a 10 pack-year smoking history. He has never used smokeless tobacco. He reports that he does not currently use alcohol after a past usage of about 1.0 standard drink of alcohol per week. He reports that he does not use drugs.  Allergies  Allergen Reactions   Cortisone     CORTISONE INJECTION MADE HIM FEEL LIKE SOMETHING WAS CRAWLING ALL OVER HIM.     Family History  Problem Relation Age of Onset   Cancer Sister 55       breast ca   Hypertension Sister    Hypertension Sister    Hypertension Sister    Hypertension Sister    Hypertension Brother    Hypertension Brother    Hypertension Brother    Colon cancer Neg Hx     Prior  to Admission medications   Medication Sig Start Date End Date Taking? Authorizing Provider  amLODipine (NORVASC) 10 MG tablet Take 10 mg by mouth daily.    [provider]  aspirin 81 MG chewable tablet Chew by mouth daily.    [provider]  atorvastatin (LIPITOR) 20 MG tablet Take 20 mg by mouth daily. Patient not taking: Reported on 04/30/2022    [provider]  HYDROcodone-acetaminophen (NORCO) 7.5-325 MG tablet Take 1 tablet by mouth every 6 (six) hours as needed for moderate pain. 10/01/22   Darreld Mclean, MD  naproxen (NAPROSYN) 500 MG tablet Take 1 tablet (500 mg total) by mouth 2 (two) times daily as needed. Patient not taking: Reported on  04/30/2022 04/14/22   Particia Nearing, PA-C  Oxymetazoline HCl (NASAL SPRAY NA) Place 1 spray into the nose daily as needed (Congestion).    [provider]  sildenafil (VIAGRA) 100 MG tablet Take 100 mg by mouth daily as needed for erectile dysfunction. 02/23/20   [provider]  valsartan-hydrochlorothiazide (DIOVAN-HCT) 160-25 MG per tablet Take 1 tablet by mouth daily.    [provider]    Physical Exam: Vitals:   10/23/22 0207 10/23/22 0230 10/23/22 0314 10/23/22 0400  BP: (!) 101/54 (!) 102/54  105/68  Pulse: 74 79  68  Resp: 16 15  17   Temp:   97.9 F (36.6 C)   TempSrc:   Oral   SpO2: 94% 94%  95%  Weight:      Height:       1.  General: Patient lying supine in bed,  no acute distress   2. Psychiatric: Alert and oriented x 3, mood and behavior normal for situation, pleasant and cooperative with exam   3. Neurologic: Speech and language are normal, face is symmetric, moves all 4 extremities voluntarily, at baseline without acute deficits on limited exam   4. HEENMT:  Head is atraumatic, normocephalic, pupils reactive to light, neck is supple, trachea is midline, mucous membranes are moist   5. Respiratory : Lungs are clear to auscultation bilaterally without wheezing, rhonchi, rales, no cyanosis, no increase in work of breathing or accessory muscle use   6. Cardiovascular : Heart rate normal, rhythm is regular, no murmurs, rubs or gallops, no peripheral edema, peripheral pulses palpated   7. Gastrointestinal:  Abdomen is soft, nondistended, nontender to palpation bowel sounds active, no masses or organomegaly palpated   8. Skin:  Skin is warm, dry and intact without rashes, acute lesions, or ulcers on limited exam   9.Musculoskeletal:  No acute deformities or trauma, no asymmetry in tone, no peripheral edema, peripheral pulses palpated, no tenderness to palpation in the extremities  Data Reviewed: In the ED Temp 98.1-99, heart  rate 68-83, respiratory 16-18, blood pressure 90/57-106/80, satting at 89% on room air and corrected to 100% on 3 L nasal cannula leukocytosis at 12.2, hemoglobin hemoconcentrated at 17.7 Slight hyponatremia 132, hypokalemia at 3.3, significant AKI with a creatinine of 8.37, bicarb 17, BUN 73 Trope 34, 32 COVID-positive CT abdomen pelvis shows enteritis without evidence of a high-grade bowel obstruction.  No evidence of urinary tract calculus.  Chest x-ray shows no active disease. EKG shows a heart rate of 80, normal sinus rhythm, QTc 456 Admission requested for AKI secondary to symptoms from COVID  Assessment and Plan: * AKI (acute kidney injury) (HCC) - Creatinine 8.37 - This is up from 1-1/2 - Hold ARB, hydrochlorothiazide - Secondary to dehydration - Nephro to see  if patient does not start trending in the correct direction with fluids - Patient is still making urine - Continue to monitor  Elevated troponin - Troponin 34 and 32 - Likely demand ischemia in the setting of hypoxia requiring 3 L nasal cannula - No ischemic changes on EKG - Monitor on telemetry  Dehydration - Secondary to GI losses and poor p.o. intake - Creatinine up to 8.37 - BUN 73 - 2 L of fluid given in the ED - Continue LR at 150 mL/h - ED physician spoke with nephrology and they said if the creatinine does not start to trend down they will see him - Continue to monitor  COVID-19 virus infection - COVID-positive - Patient is up-to-date on COVID shots and boosters - 4 days of symptoms - Consult pharmacy for remdesivir - Continue oxygen - Continue steroids - Continue to monitor  Hypokalemia Secondary to GI losses and poor p.o. intake - Potassium 3.3 - Replace and recheck - Continue to monitor  Essential hypertension - Soft blood pressure in the ED - Holding Norvasc, ARB, hydrochlorothiazide      Advance Care Planning:   Code Status: Full Code  Consults: Nephro if creatinine does not trend  down  Family Communication: No family at bedside  Severity of Illness: The appropriate patient status for this patient is INPATIENT. Inpatient status is judged to be reasonable and necessary in order to provide the required intensity of service to ensure the patient's safety. The patient's presenting symptoms, physical exam findings, and initial radiographic and laboratory data in the context of their chronic comorbidities is felt to place them at high risk for further clinical deterioration. Furthermore, it is not anticipated that the patient will be medically stable for discharge from the hospital within 2 midnights of admission.   * I certify that at the point of admission it is my clinical judgment that the patient will require inpatient hospital care spanning beyond 2 midnights from the point of admission due to high intensity of service, high risk for further deterioration and high frequency of surveillance required.*  Author: Lilyan Gilford, DO 10/23/2022 4:34 AM  For on call review [email or web address].

## 2022-10-23 NOTE — Consult Note (Signed)
Reason for Consult: AKI/CKD Referring Physician: Tat, MD  [name] is an 67 y.o. male with a PMH significant for HTN, HLD, blindness, alcohol abuse, and CKD Stage III who presented to [facility] ED on 10/22/22 with 4 day history of chest pain, myalgias, diarrhea, and decreased po intake.  In the ED, Temp 98.7, Bp 90/80, HR 79, SpO2 100%.  Sars +, labs notable for Na 132, K 3.3, Cl 87, Co2 17, BUN 73, Cr 8.37, AST 56, ALT 47, troponin I 34, WBC 12.2, Hgb 17.7, plt 546.  CXR NAD, Ct abd and pelvis with scattered gas fluid levels throughout the distal small bowel and proximal colon c/w ileus or enteritis.  No obstruction.  He was admitted and started on IVF's and we were consulted to further evaluate his AKI n CKD stage III (last known Scr was 1.58 on 07/05/20).  The trend in Scr is seen below.    Unfortunately there is a 2 year gap in labs and he denies any knowledge of CKD or referral to Nephrology in the past.   Trend in Creatinine: Creatinine, Ser  Date/Time Value Ref Range Status  10/23/2022 03:40 AM 6.07 (H) 0.61 - 1.24 mg/dL Final  95/62/1308 65:78 PM 8.37 (H) 0.61 - 1.24 mg/dL Final  46/96/2952 84:13 PM 1.58 (H) 0.61 - 1.24 mg/dL Final  24/40/1027 25:36 PM 1.65 (H) 0.61 - 1.24 mg/dL Final  64/40/3474 25:95 PM 1.59 (H) 0.61 - 1.24 mg/dL Final  63/87/5643 32:95 AM 1.37 (H) 0.61 - 1.24 mg/dL Final  18/84/1660 63:01 PM 1.32 (H) 0.61 - 1.24 mg/dL Final  60/11/9321 55:73 PM 1.33 (H) 0.61 - 1.24 mg/dL Final  22/03/5425 06:23 AM 1.19 0.61 - 1.24 mg/dL Final  76/28/3151 76:16 PM 1.11 0.50 - 1.35 mg/dL Final  07/37/1062 69:48 AM 1.12 0.40 - 1.50 mg/dL Final    PMH:   Past Medical History:  Diagnosis Date   Alcohol abuse    Blind    Hyperlipidemia    Hypertension     PSH:   Past Surgical History:  Procedure Laterality Date   CATARACT EXTRACTION W/PHACO Right 09/26/2014   Procedure: CATARACT EXTRACTION PHACO AND INTRAOCULAR LENS PLACEMENT RIGHT EYE CDE=18.56;  Surgeon: Gemma Payor, MD;   Location: [facility];  Service: Ophthalmology;  Laterality: Right;   COLONOSCOPY WITH PROPOFOL N/A 07/07/2020   Procedure: COLONOSCOPY WITH PROPOFOL;  Surgeon: Lanelle Bal, DO;  Location: [facility];  Service: Endoscopy;  Laterality: N/A;  10:00am   POLYPECTOMY  07/07/2020   Procedure: POLYPECTOMY;  Surgeon: Lanelle Bal, DO;  Location: [facility];  Service: Endoscopy;;   ROTATOR CUFF REPAIR Left 2012   TOOTH EXTRACTION N/A 1996    Allergies:  Allergies  Allergen Reactions   Cortisone     CORTISONE INJECTION MADE HIM FEEL LIKE SOMETHING WAS CRAWLING ALL OVER HIM.     Medications:   Prior to Admission medications   Medication Sig Start Date End Date Taking? Authorizing Provider  amLODipine (NORVASC) 10 MG tablet Take 10 mg by mouth daily.    [provider]  aspirin 81 MG chewable tablet Chew by mouth daily.    [provider]  atorvastatin (LIPITOR) 20 MG tablet Take 20 mg by mouth daily. Patient not taking: Reported on 04/30/2022    [provider]  HYDROcodone-acetaminophen (NORCO) 7.5-325 MG tablet Take 1 tablet by mouth every 6 (six) hours as needed for moderate pain. 10/01/22   Darreld Mclean, MD  naproxen (NAPROSYN) 500 MG tablet  Take 1 tablet (500 mg total) by mouth 2 (two) times daily as needed. Patient not taking: Reported on 04/30/2022 04/14/22   Particia Nearing, PA-C  Oxymetazoline HCl (NASAL SPRAY NA) Place 1 spray into the nose daily as needed (Congestion).    [provider]  sildenafil (VIAGRA) 100 MG tablet Take 100 mg by mouth daily as needed for erectile dysfunction. 02/23/20   [provider]  valsartan-hydrochlorothiazide (DIOVAN-HCT) 160-25 MG per tablet Take 1 tablet by mouth daily.    [provider]    Inpatient medications:  arformoterol  15 mcg Nebulization BID   budesonide (PULMICORT) nebulizer solution  0.5 mg Nebulization BID   Chlorhexidine Gluconate Cloth  6 each Topical Q0600   heparin   5,000 Units Subcutaneous Q8H   [START ON 10/24/2022] influenza vaccine adjuvanted  0.5 mL Intramuscular Tomorrow-1000   ipratropium-albuterol  3 mL Nebulization Q6H   methylPREDNISolone (SOLU-MEDROL) injection  1 mg/kg Intravenous Q12H   Followed by   Melene Muller ON 10/26/2022] predniSONE  50 mg Oral Daily    Discontinued Meds:   Medications Discontinued During This Encounter  Medication Reason   remdesivir 200 mg in sodium chloride 0.9% 250 mL IVPB    remdesivir 100 mg in sodium chloride 0.9 % 100 mL IVPB    morphine (PF) 2 MG/ML injection 2 mg     Social History:  reports that he has been smoking cigarettes. He has a 10 pack-year smoking history. He has never used smokeless tobacco. He reports that he does not currently use alcohol after a past usage of about 1.0 standard drink of alcohol per week. He reports that he does not use drugs.  Family History:   Family History  Problem Relation Age of Onset   Cancer Sister 70       breast ca   Hypertension Sister    Hypertension Sister    Hypertension Sister    Hypertension Sister    Hypertension Brother    Hypertension Brother    Hypertension Brother    Colon cancer Neg Hx     Pertinent items are noted in HPI. Weight change:   Intake/Output Summary (Last 24 hours) at 10/23/2022 1056 Last data filed at 10/23/2022 0314 Gross per 24 hour  Intake 2100 ml  Output 750 ml  Net 1350 ml   BP (!) 101/56   Pulse 74   Temp 98.1 F (36.7 C) (Oral)   Resp 16   Ht 5\' 10"  (1.778 m)   Wt 90.6 kg   SpO2 97%   BMI 28.66 kg/m  Vitals:   10/23/22 0815 10/23/22 0830 10/23/22 0900 10/23/22 1000  BP:  (!) 101/56    Pulse: 65 70  74  Resp: 15 15  16   Temp:  98.1 F (36.7 C) 98.1 F (36.7 C)   TempSrc:  Oral Oral   SpO2: 97% 96%  97%  Weight:   90.6 kg   Height:   5\' 10"  (1.778 m)      General appearance: alert, cooperative, and no distress Head: Normocephalic, without obvious abnormality, atraumatic Resp: clear to auscultation  bilaterally Cardio: regular rate and rhythm, S1, S2 normal, no murmur, click, rub or gallop GI: soft, non-tender; bowel sounds normal; no masses,  no organomegaly Extremities: extremities normal, atraumatic, no cyanosis or edema  Labs: Basic Metabolic Panel: Recent Labs  Lab 10/22/22 1735 10/23/22 0340  NA 132* 132*  K 3.3* 2.8*  CL 87* 97*  CO2 17* 17*  GLUCOSE 112* 124*  BUN 73* 73*  CREATININE 8.37* 6.07*  ALBUMIN 5.0 3.6  CALCIUM 9.2 8.3*   Liver Function Tests: Recent Labs  Lab 10/22/22 1735 10/23/22 0340  AST 56* 44*  ALT 47* 34  ALKPHOS 91 68  BILITOT 0.5 0.5  PROT 9.0* 6.6  ALBUMIN 5.0 3.6   Recent Labs  Lab 10/22/22 1735  LIPASE 89*   No results for input(s): "AMMONIA" in the last 168 hours. CBC: Recent Labs  Lab 10/22/22 1735 10/23/22 0340  WBC 12.2* 10.5  NEUTROABS  --  9.4*  HGB 17.7* 15.5  HCT 53.4* 45.5  MCV 79.8* 78.4*  PLT 546* 442*   PT/INR: @LABRCNTIP (inr:5) Cardiac Enzymes: )No results for input(s): "CKTOTAL", "CKMB", "CKMBINDEX", "TROPONINI" in the last 168 hours. CBG: No results for input(s): "GLUCAP" in the last 168 hours.  Iron Studies: No results for input(s): "IRON", "TIBC", "TRANSFERRIN", "FERRITIN" in the last 168 hours.  Xrays/Other Studies: CT ABDOMEN PELVIS WO CONTRAST  Result Date: 10/22/2022 CLINICAL DATA:  Epigastric pain, renal insufficiency EXAM: CT ABDOMEN AND PELVIS WITHOUT CONTRAST TECHNIQUE: Multidetector CT imaging of the abdomen and pelvis was performed following the standard protocol without IV contrast. RADIATION DOSE REDUCTION: This exam was performed according to the departmental dose-optimization program which includes automated exposure control, adjustment of the mA and/or kV according to patient size and/or use of iterative reconstruction technique. COMPARISON:  None Available. FINDINGS: Lower chest: Subsegmental atelectasis or scarring within the lingula. No acute pleural or parenchymal lung disease.  Hepatobiliary: Unremarkable unenhanced appearance of the liver and gallbladder. Pancreas: Unremarkable unenhanced appearance. Spleen: Unremarkable unenhanced appearance. Adrenals/Urinary Tract: No urinary tract calculi or obstructive uropathy within either kidney. Exophytic 2.9 x 2.3 cm hypodensity lower pole left kidney measures 16 HU, compatible with a cyst. No specific imaging follow-up is recommended. The adrenals and bladder are unremarkable. Stomach/Bowel: No evidence of high-grade bowel obstruction. Scattered gas fluid levels are seen throughout the jejunum, ileum, and proximal colon, which may reflect ileus or enteritis. Normal appendix right lower quadrant. No bowel wall thickening or inflammatory change. Vascular/Lymphatic: Aortic atherosclerosis. No enlarged abdominal or pelvic lymph nodes. Reproductive: Prostate is unremarkable. Other: No free fluid or free intraperitoneal gas. No abdominal wall hernia. Musculoskeletal: No acute or destructive bony abnormalities. Chronic compression deformity superior endplate L1 and T11 vertebral bodies. Reconstructed images demonstrate no additional findings. IMPRESSION: 1. Scattered gas fluid levels throughout the distal small bowel and proximal colon, compatible with ileus or enteritis. No evidence of high-grade bowel obstruction. 2. No evidence of urinary tract calculi or obstructive uropathy. 3.  Aortic Atherosclerosis (ICD10-I70.0). Electronically Signed   By: Sharlet Salina M.D.   On: 10/22/2022 20:49   DG Chest 2 View  Result Date: 10/22/2022 CLINICAL DATA:  Chest pain. EXAM: CHEST - 2 VIEW COMPARISON:  10/31/2019. FINDINGS: Bilateral lung fields are clear. Bilateral costophrenic angles are clear. Normal cardio-mediastinal silhouette. No acute osseous abnormalities. The soft tissues are within normal limits. IMPRESSION: No active cardiopulmonary disease. Electronically Signed   By: Jules Schick M.D.   On: 10/22/2022 16:49     Assessment/Plan:  AKI/CKD  stage IIIb - presumably ischemic ATN in setting of profound volume depletion, hypotension with concomitant ARB therapy.  Scr is improving with IVF's and continue to hold ARB and diuretics for now.  Unknown baseline as his last Scr was 1.58 in June 2022.  Will continue to follow UOP and daily Scr.  No uremic symptoms at this time.  No indication for dialysis. Continue IVF's for now  Avoid nephrotoxic medications including NSAIDs and iodinated intravenous contrast exposure unless the latter is absolutely indicated.   Preferred narcotic agents for pain control are hydromorphone, fentanyl, and methadone. Morphine should not be used.  Avoid Baclofen and avoid oral sodium phosphate and magnesium citrate based laxatives / bowel preps.  Continue strict Input and Output monitoring. Will monitor the patient closely with you and intervene or adjust therapy as indicated by changes in clinical status/labs   Acute hypoxic respiratory failure - likely secondary to Covid-19 infection. COVID + - remdesivir per primary svc Diarrhea -  C diff and GI panel pending.  Likely enteritis from COVID-19.  Workup per primary svc Hypokalemia - replete and follow HTN- holding meds for now due to hypotension.   Julien Nordmann Jodette Wik 10/23/2022, 10:56 AM

## 2022-10-23 NOTE — Assessment & Plan Note (Signed)
Secondary to GI losses and poor p.o. intake - Potassium 3.3 - Replace and recheck - Continue to monitor

## 2022-10-24 ENCOUNTER — Other Ambulatory Visit: Payer: Self-pay

## 2022-10-24 DIAGNOSIS — N179 Acute kidney failure, unspecified: Secondary | ICD-10-CM | POA: Diagnosis not present

## 2022-10-24 DIAGNOSIS — J9601 Acute respiratory failure with hypoxia: Secondary | ICD-10-CM | POA: Diagnosis not present

## 2022-10-24 DIAGNOSIS — E876 Hypokalemia: Secondary | ICD-10-CM | POA: Diagnosis not present

## 2022-10-24 DIAGNOSIS — U071 COVID-19: Secondary | ICD-10-CM | POA: Diagnosis not present

## 2022-10-24 LAB — URINALYSIS, ROUTINE W REFLEX MICROSCOPIC
Bacteria, UA: NONE SEEN
Bilirubin Urine: NEGATIVE
Glucose, UA: NEGATIVE mg/dL
Ketones, ur: NEGATIVE mg/dL
Leukocytes,Ua: NEGATIVE
Nitrite: NEGATIVE
Protein, ur: 30 mg/dL — AB
Specific Gravity, Urine: 1.016 (ref 1.005–1.030)
pH: 5 (ref 5.0–8.0)

## 2022-10-24 LAB — CBC
HCT: 42.6 % (ref 39.0–52.0)
Hemoglobin: 14.3 g/dL (ref 13.0–17.0)
MCH: 26.1 pg (ref 26.0–34.0)
MCHC: 33.6 g/dL (ref 30.0–36.0)
MCV: 77.9 fL — ABNORMAL LOW (ref 80.0–100.0)
Platelets: 461 10*3/uL — ABNORMAL HIGH (ref 150–400)
RBC: 5.47 MIL/uL (ref 4.22–5.81)
RDW: 15.8 % — ABNORMAL HIGH (ref 11.5–15.5)
WBC: 11.1 10*3/uL — ABNORMAL HIGH (ref 4.0–10.5)
nRBC: 0 % (ref 0.0–0.2)

## 2022-10-24 LAB — COMPREHENSIVE METABOLIC PANEL
ALT: 32 U/L (ref 0–44)
AST: 34 U/L (ref 15–41)
Albumin: 3.4 g/dL — ABNORMAL LOW (ref 3.5–5.0)
Alkaline Phosphatase: 58 U/L (ref 38–126)
Anion gap: 13 (ref 5–15)
BUN: 76 mg/dL — ABNORMAL HIGH (ref 8–23)
CO2: 21 mmol/L — ABNORMAL LOW (ref 22–32)
Calcium: 9 mg/dL (ref 8.9–10.3)
Chloride: 102 mmol/L (ref 98–111)
Creatinine, Ser: 3.25 mg/dL — ABNORMAL HIGH (ref 0.61–1.24)
GFR, Estimated: 20 mL/min — ABNORMAL LOW (ref >60)
Glucose, Bld: 152 mg/dL — ABNORMAL HIGH (ref 70–99)
Potassium: 3 mmol/L — ABNORMAL LOW (ref 3.5–5.1)
Sodium: 136 mmol/L (ref 135–145)
Total Bilirubin: 0.5 mg/dL (ref 0.3–1.2)
Total Protein: 6.3 g/dL — ABNORMAL LOW (ref 6.5–8.1)

## 2022-10-24 LAB — LACTIC ACID, PLASMA
Lactic Acid, Venous: 1.9 mmol/L (ref 0.5–1.9)
Lactic Acid, Venous: 1.6 mmol/L (ref 0.5–1.9)

## 2022-10-24 LAB — MRSA NEXT GEN BY PCR, NASAL: MRSA by PCR Next Gen: NOT DETECTED

## 2022-10-24 LAB — FERRITIN: Ferritin: 356 ng/mL — ABNORMAL HIGH (ref 24–336)

## 2022-10-24 LAB — D-DIMER, QUANTITATIVE: D-Dimer, Quant: 0.48 ug/mL-FEU (ref 0.00–0.50)

## 2022-10-24 LAB — C-REACTIVE PROTEIN: CRP: 2.8 mg/dL — ABNORMAL HIGH (ref <1.0)

## 2022-10-24 LAB — PROTEIN / CREATININE RATIO, URINE
Creatinine, Urine: 95 mg/dL
Protein Creatinine Ratio: 0.39 mg/mg{Cre} — ABNORMAL HIGH (ref 0.00–0.15)
Total Protein, Urine: 37 mg/dL

## 2022-10-24 LAB — MISC LABCORP TEST (SEND OUT): Labcorp test code: 83935

## 2022-10-24 LAB — PROCALCITONIN: Procalcitonin: 0.79 ng/mL

## 2022-10-24 MED ORDER — SODIUM CHLORIDE 0.9% FLUSH
10.0000 mL | Freq: Two times a day (BID) | INTRAVENOUS | Status: DC
  Administered 2022-10-24 – 2022-10-28 (×7): 10 mL

## 2022-10-24 MED ORDER — SODIUM CHLORIDE 0.9% FLUSH: 10.0000 mL | INTRAVENOUS | Status: DC | PRN

## 2022-10-24 MED ORDER — ORAL CARE MOUTH RINSE: 15.0000 mL | OROMUCOSAL | Status: DC | PRN

## 2022-10-24 MED ORDER — SODIUM CHLORIDE 0.9 % IV SOLN: 250.0000 mL | INTRAVENOUS | Status: DC

## 2022-10-24 MED ORDER — NOREPINEPHRINE 4 MG/250ML-% IV SOLN
2.0000 ug/min | INTRAVENOUS | Status: DC
  Administered 2022-10-24: 2 ug/min via INTRAVENOUS
  Filled 2022-10-24: qty 250

## 2022-10-24 MED ORDER — LINEZOLID 600 MG PO TABS
600.0000 mg | ORAL_TABLET | Freq: Two times a day (BID) | ORAL | Status: DC
  Administered 2022-10-24 – 2022-10-26 (×5): 600 mg via ORAL
  Filled 2022-10-24 (×10): qty 1

## 2022-10-24 MED ORDER — POTASSIUM CHLORIDE CRYS ER 20 MEQ PO TBCR
40.0000 meq | EXTENDED_RELEASE_TABLET | Freq: Two times a day (BID) | ORAL | Status: AC
  Administered 2022-10-24 – 2022-10-25 (×4): 40 meq via ORAL
  Filled 2022-10-24 (×3): qty 2

## 2022-10-24 MED ORDER — POTASSIUM CHLORIDE CRYS ER 20 MEQ PO TBCR
40.0000 meq | EXTENDED_RELEASE_TABLET | Freq: Once | ORAL | Status: DC
  Filled 2022-10-24: qty 2

## 2022-10-24 MED ORDER — SODIUM CHLORIDE 0.9 % IV SOLN
2.0000 g | INTRAVENOUS | Status: DC
  Administered 2022-10-24 – 2022-10-25 (×2): 2 g via INTRAVENOUS
  Filled 2022-10-24 (×2): qty 12.5

## 2022-10-24 NOTE — Progress Notes (Signed)
Peripherally Inserted Central Catheter Placement  The IV Nurse has discussed with the patient and/or persons authorized to consent for the patient, the purpose of this procedure and the potential benefits and risks involved with this procedure.  The benefits include less needle sticks, lab draws from the catheter, and the patient may be discharged home with the catheter. Risks include, but not limited to, infection, bleeding, blood clot (thrombus formation), and puncture of an artery; nerve damage and irregular heartbeat and possibility to perform a PICC exchange if needed/ordered by physician.  Alternatives to this procedure were also discussed.  Bard Power PICC patient education guide, fact sheet on infection prevention and patient information card has been provided to patient /or left at bedside.    PICC Placement Documentation  PICC Double Lumen 10/24/22 Right Basilic 38 cm 0 cm (Active)  Indication for Insertion or Continuance of Line Vasoactive infusions 10/24/22 1711  Exposed Catheter (cm) 0 cm 10/24/22 1711  Site Assessment Clean, Dry, Intact 10/24/22 1711  Lumen #1 Status Flushed;Blood return noted;Saline locked 10/24/22 1711  Lumen #2 Status Flushed;Blood return noted;Saline locked 10/24/22 1711  Dressing Type Transparent 10/24/22 1711  Dressing Status Antimicrobial disc in place 10/24/22 1711  Line Care Connections checked and tightened 10/24/22 1711  Line Adjustment (NICU/IV Team Only) No 10/24/22 1711  Dressing Intervention New dressing 10/24/22 1711  Dressing Change Due 10/31/22 10/24/22 1711       Audrie Gallus 10/24/2022, 5:13 PM

## 2022-10-24 NOTE — Progress Notes (Signed)
Patient ID: [name], male   DOB: 1955/08/31, 67 y.o.   MRN: [id number] S: No events overnight.  ECHO with normal EF 60-65% and Grade I Diastolic dysfunction. O:BP (!) 103/45   Pulse 65   Temp 98 F (36.7 C) (Axillary)   Resp 20   Ht 5\' 10"  (1.778 m)   Wt 90.6 kg   SpO2 100%   BMI 28.66 kg/m   Intake/Output Summary (Last 24 hours) at 10/24/2022 0959 Last data filed at 10/24/2022 0800 Gross per 24 hour  Intake 3620.36 ml  Output 1300 ml  Net 2320.36 ml   Intake/Output: I/O last 3 completed shifts: In: 5570.4 [I.V.:3370.4; IV Piggyback:2200] Out: 1750 [Urine:1750]  Intake/Output this shift:  Total I/O In: 150 [I.V.:150] Out: 300 [Urine:300] Weight change: 0.153 kg Gen:NAD, resting comfortably   Recent Labs  Lab 10/22/22 1735 10/23/22 0340 10/24/22 0534  NA 132* 132* 136  K 3.3* 2.8* 3.0*  CL 87* 97* 102  CO2 17* 17* 21*  GLUCOSE 112* 124* 152*  BUN 73* 73* 76*  CREATININE 8.37* 6.07* 3.25*  ALBUMIN 5.0 3.6 3.4*  CALCIUM 9.2 8.3* 9.0  AST 56* 44* 34  ALT 47* 34 32   Liver Function Tests: Recent Labs  Lab 10/22/22 1735 10/23/22 0340 10/24/22 0534  AST 56* 44* 34  ALT 47* 34 32  ALKPHOS 91 68 58  BILITOT 0.5 0.5 0.5  PROT 9.0* 6.6 6.3*  ALBUMIN 5.0 3.6 3.4*   Recent Labs  Lab 10/22/22 1735  LIPASE 89*   No results for input(s): "AMMONIA" in the last 168 hours. CBC: Recent Labs  Lab 10/22/22 1735 10/23/22 0340 10/24/22 0534  WBC 12.2* 10.5 11.1*  NEUTROABS  --  9.4*  --   HGB 17.7* 15.5 14.3  HCT 53.4* 45.5 42.6  MCV 79.8* 78.4* 77.9*  PLT 546* 442* 461*   Cardiac Enzymes: No results for input(s): "CKTOTAL", "CKMB", "CKMBINDEX", "TROPONINI" in the last 168 hours. CBG: No results for input(s): "GLUCAP" in the last 168 hours.  Iron Studies:  Recent Labs    10/24/22 0534  FERRITIN 356*   Studies/Results: ECHOCARDIOGRAM COMPLETE  Result Date: 10/23/2022    ECHOCARDIOGRAM REPORT   Patient Name:   [name] Date of Exam:  10/23/2022 Medical Rec #:  [id number]        Height:       70.0 in Accession #:    [id number]       Weight:       199.7 lb Date of Birth:  [birth year]       BSA:          2.086 m Patient Age:    66 years         BP:           88/49 mmHg Patient Gender: M                HR:           67 bpm. Exam Location:  Inpatient Procedure: 2D Echo, Cardiac Doppler and Color Doppler Indications:    Chest Pain R07.9  History:        Patient has no prior history of Echocardiogram examinations.                 Risk Factors:Hypertension, Dyslipidemia and Current Smoker.  Sonographer:    Lucendia Herrlich RCS Referring Phys: [phone] DAVID TAT  Sonographer Comments: Image acquisition challenging due to uncooperative patient. IMPRESSIONS  1. Left ventricular ejection  fraction, by estimation, is 60 to 65%. The left ventricle has normal function. Left ventricular endocardial border not optimally defined to evaluate regional wall motion. Left ventricular diastolic parameters are consistent with Grade I diastolic dysfunction (impaired relaxation).  2. Right ventricular systolic function is normal. The right ventricular size is normal.  3. The mitral valve is normal in structure. No evidence of mitral valve regurgitation. No evidence of mitral stenosis.  4. The aortic valve has an indeterminant number of cusps. There is mild calcification of the aortic valve. Aortic valve regurgitation is not visualized. No aortic stenosis is present. Comparison(s): No prior Echocardiogram. FINDINGS  Left Ventricle: Left ventricular ejection fraction, by estimation, is 60 to 65%. The left ventricle has normal function. Left ventricular endocardial border not optimally defined to evaluate regional wall motion. The left ventricular internal cavity size was normal in size. There is no left ventricular hypertrophy. Left ventricular diastolic parameters are consistent with Grade I diastolic dysfunction (impaired relaxation). Right Ventricle: The right ventricular size  is normal. No increase in right ventricular wall thickness. Right ventricular systolic function is normal. Left Atrium: Left atrial size was normal in size. Right Atrium: Right atrial size was normal in size. Pericardium: There is no evidence of pericardial effusion. Mitral Valve: The mitral valve is normal in structure. No evidence of mitral valve regurgitation. No evidence of mitral valve stenosis. Tricuspid Valve: The tricuspid valve is grossly normal. Tricuspid valve regurgitation is not demonstrated. No evidence of tricuspid stenosis. Aortic Valve: The aortic valve has an indeterminant number of cusps. There is mild calcification of the aortic valve. Aortic valve regurgitation is not visualized. No aortic stenosis is present. Aortic valve peak gradient measures 12.2 mmHg. Pulmonic Valve: The pulmonic valve was grossly normal. Pulmonic valve regurgitation is not visualized. No evidence of pulmonic stenosis. Aorta: The aortic root and ascending aorta are structurally normal, with no evidence of dilitation. Venous: The inferior vena cava was not well visualized. IAS/Shunts: The interatrial septum was not assessed.  LEFT VENTRICLE PLAX 2D LVIDd:         4.00 cm   Diastology LVIDs:         2.60 cm   LV e' medial:    6.31 cm/s LV PW:         1.10 cm   LV E/e' medial:  11.0 LV IVS:        1.00 cm   LV e' lateral:   10.30 cm/s LVOT diam:     2.00 cm   LV E/e' lateral: 6.7 LV SV:         70 LV SV Index:   33 LVOT Area:     3.14 cm  RIGHT VENTRICLE RV S prime:     11.70 cm/s TAPSE (M-mode): 1.5 cm LEFT ATRIUM           Index        RIGHT ATRIUM           Index LA diam:      2.80 cm 1.34 cm/m   RA Area:     15.30 cm LA Vol (A4C): 25.7 ml 12.32 ml/m  RA Volume:   40.70 ml  19.51 ml/m  AORTIC VALVE AV Area (Vmax): 2.32 cm AV Vmax:        175.00 cm/s AV Peak Grad:   12.2 mmHg LVOT Vmax:      129.00 cm/s LVOT Vmean:     78.033 cm/s LVOT VTI:       0.222 m  AORTA Ao Root diam: 3.20 cm Ao Asc diam:  3.70 cm MITRAL VALVE                TRICUSPID VALVE MV Area (PHT): 2.83 cm    TR Peak grad:   9.5 mmHg MV Decel Time: 268 msec    TR Vmax:        154.00 cm/s MV E velocity: 69.40 cm/s MV A velocity: 81.00 cm/s  SHUNTS MV E/A ratio:  0.86        Systemic VTI:  0.22 m                            Systemic Diam: 2.00 cm Vishnu Priya Mallipeddi Electronically signed by Winfield Rast Mallipeddi Signature Date/Time: 10/23/2022/12:27:37 PM    Final    CT ABDOMEN PELVIS WO CONTRAST  Result Date: 10/22/2022 CLINICAL DATA:  Epigastric pain, renal insufficiency EXAM: CT ABDOMEN AND PELVIS WITHOUT CONTRAST TECHNIQUE: Multidetector CT imaging of the abdomen and pelvis was performed following the standard protocol without IV contrast. RADIATION DOSE REDUCTION: This exam was performed according to the departmental dose-optimization program which includes automated exposure control, adjustment of the mA and/or kV according to patient size and/or use of iterative reconstruction technique. COMPARISON:  None Available. FINDINGS: Lower chest: Subsegmental atelectasis or scarring within the lingula. No acute pleural or parenchymal lung disease. Hepatobiliary: Unremarkable unenhanced appearance of the liver and gallbladder. Pancreas: Unremarkable unenhanced appearance. Spleen: Unremarkable unenhanced appearance. Adrenals/Urinary Tract: No urinary tract calculi or obstructive uropathy within either kidney. Exophytic 2.9 x 2.3 cm hypodensity lower pole left kidney measures 16 HU, compatible with a cyst. No specific imaging follow-up is recommended. The adrenals and bladder are unremarkable. Stomach/Bowel: No evidence of high-grade bowel obstruction. Scattered gas fluid levels are seen throughout the jejunum, ileum, and proximal colon, which may reflect ileus or enteritis. Normal appendix right lower quadrant. No bowel wall thickening or inflammatory change. Vascular/Lymphatic: Aortic atherosclerosis. No enlarged abdominal or pelvic lymph nodes. Reproductive:  Prostate is unremarkable. Other: No free fluid or free intraperitoneal gas. No abdominal wall hernia. Musculoskeletal: No acute or destructive bony abnormalities. Chronic compression deformity superior endplate L1 and T11 vertebral bodies. Reconstructed images demonstrate no additional findings. IMPRESSION: 1. Scattered gas fluid levels throughout the distal small bowel and proximal colon, compatible with ileus or enteritis. No evidence of high-grade bowel obstruction. 2. No evidence of urinary tract calculi or obstructive uropathy. 3.  Aortic Atherosclerosis (ICD10-I70.0). Electronically Signed   By: Sharlet Salina M.D.   On: 10/22/2022 20:49   DG Chest 2 View  Result Date: 10/22/2022 CLINICAL DATA:  Chest pain. EXAM: CHEST - 2 VIEW COMPARISON:  10/31/2019. FINDINGS: Bilateral lung fields are clear. Bilateral costophrenic angles are clear. Normal cardio-mediastinal silhouette. No acute osseous abnormalities. The soft tissues are within normal limits. IMPRESSION: No active cardiopulmonary disease. Electronically Signed   By: Jules Schick M.D.   On: 10/22/2022 16:49    arformoterol  15 mcg Nebulization BID   budesonide (PULMICORT) nebulizer solution  0.5 mg Nebulization BID   Chlorhexidine Gluconate Cloth  6 each Topical Q0600   heparin  5,000 Units Subcutaneous Q8H   influenza vaccine adjuvanted  0.5 mL Intramuscular Tomorrow-1000   ipratropium-albuterol  3 mL Nebulization BID   methylPREDNISolone (SOLU-MEDROL) injection  1 mg/kg Intravenous Q12H   Followed by   Melene Muller ON 10/26/2022] predniSONE  50 mg Oral Daily   potassium chloride  40 mEq Oral Once  BMET    Component Value Date/Time   NA 136 10/24/2022 0534   K 3.0 (L) 10/24/2022 0534   CL 102 10/24/2022 0534   CO2 21 (L) 10/24/2022 0534   GLUCOSE 152 (H) 10/24/2022 0534   BUN 76 (H) 10/24/2022 0534   CREATININE 3.25 (H) 10/24/2022 0534   CALCIUM 9.0 10/24/2022 0534   GFRNONAA 20 (L) 10/24/2022 0534   GFRAA 50 (L) 03/28/2018 2005    CBC    Component Value Date/Time   WBC 11.1 (H) 10/24/2022 0534   RBC 5.47 10/24/2022 0534   HGB 14.3 10/24/2022 0534   HCT 42.6 10/24/2022 0534   PLT 461 (H) 10/24/2022 0534   MCV 77.9 (L) 10/24/2022 0534   MCH 26.1 10/24/2022 0534   MCHC 33.6 10/24/2022 0534   RDW 15.8 (H) 10/24/2022 0534   LYMPHSABS 0.7 10/23/2022 0340   MONOABS 0.3 10/23/2022 0340   EOSABS 0.0 10/23/2022 0340   BASOSABS 0.0 10/23/2022 0340    Assessment/Plan:  AKI/CKD stage IIIb - presumably ischemic ATN in setting of profound volume depletion, hypotension with concomitant ARB therapy.  BUN/Scr markedly improved with IVF's and continue to hold ARB and diuretics for now.  Continue with IVF's.  Unknown baseline as his last Scr was 1.58 in June 2022.  Will continue to follow UOP and daily Scr.  No uremic symptoms at this time.  No indication for dialysis. Continue IVF's for now  Avoid nephrotoxic medications including NSAIDs and iodinated intravenous contrast exposure unless the latter is absolutely indicated.   Preferred narcotic agents for pain control are hydromorphone, fentanyl, and methadone. Morphine should not be used.  Avoid Baclofen and avoid oral sodium phosphate and magnesium citrate based laxatives / bowel preps.  Continue strict Input and Output monitoring. Will monitor the patient closely with you and intervene or adjust therapy as indicated by changes in clinical status/labs   Acute hypoxic respiratory failure - likely secondary to Covid-19 infection. COVID + - remdesivir per primary svc Diarrhea -  C diff and GI panel pending.  Likely enteritis from COVID-19.  Workup per primary svc Hypokalemia - replete and follow.  He will need more than just 40 mEq daily.  Will increase to bid and may need IV KCl if he doesn't respond.  Mg level was mildly elevated.  HTN- holding meds for now due to hypotension.  Irena Cords, MD [facility]

## 2022-10-24 NOTE — Progress Notes (Signed)
PT Cancellation Note  Patient Details Name: [name] MRN: [id number] DOB: [birth year]   Cancelled Treatment:    Reason Eval/Treat Not Completed: Medical issues which prohibited therapy. Consulted with nursing regarding patient's low BP (100/43; HR 58). Deferred PT evaluation for today. Will attempt again tomorrow. Thank you for the referral.   Katina Dung. Hartnett-Rands, MS, PT [facility] [place] [phone]  10/24/2022, 1:15 PM

## 2022-10-24 NOTE — Progress Notes (Signed)
Pharmacy Antibiotic Note  [name] is a 67 y.o. male admitted on 10/22/2022 with sepsis.  Pharmacy has been consulted for Cefepime dosing. AKI Scr 6.07> 3.25, COVD +  Plan: Cefepime 2gm IV q24h Also, on zyvox 600mg  po bid F/u cxs and clinical progress Monitor V/S, labs  Height: 5\' 10"  (177.8 cm) Weight: 90.6 kg (199 lb 11.8 oz) IBW/kg (Calculated) : 73  Temp (24hrs), Avg:98.2 F (36.8 C), Min:98 F (36.7 C), Max:98.5 F (36.9 C)  Recent Labs  Lab 10/22/22 1735 10/23/22 0340 10/24/22 0534  WBC 12.2* 10.5 11.1*  CREATININE 8.37* 6.07* 3.25*    Estimated Creatinine Clearance: 25 mL/min (A) (by C-G formula based on SCr of 3.25 mg/dL (H)).    Allergies  Allergen Reactions   Cortisone     Cortisone injection made him feel like something was crawling all over him.     Antimicrobials this admission: Cefepime  9/19 >>  zyvox 9/19 >>   Microbiology results: 9/17 SARS 2 CV is +  Thank you for allowing pharmacy to be a part of this patient's care.  Elder Cyphers, BS Pharm D, BCPS Clinical Pharmacist 10/24/2022 10:21 AM

## 2022-10-24 NOTE — Progress Notes (Addendum)
PROGRESS NOTE  [name] NWG:[id number] DOB: 02/08/1955 DOA: 10/22/2022 PCP: Mirna Mires, MD  Brief History:  67 year old male with a history of blindness, hypertension, hyperlipidemia, tobacco abuse, alcohol abuse presenting with 4-day history of chest pain, coughing, myalgias, and loose stools.  The patient had some nausea without emesis.  He had some exertional dyspnea.  He states that the chest pain is worsened with coughing or deep breaths.  He denies any fever, chills, headache, neck pain, abdominal pain, hematochezia, melena. In the ED, the patient had a low-grade temperature 9 9.0 F.  He was hemodynamically stable.  Oxygen saturation was 89% on room air.  The patient was placed on 3 L with saturation 93-95%.  WBC 12.2, hemoglobin 17.7, platelets 546.  Sodium 132, potassium 2.8, bicarbonate 17, serum creatinine 8.37. COVID-19 PCR was positive.  CT of the abdomen and pelvis showed bibasilar atelectasis.  There was scattered air-fluid levels throughout the distal small bowel and proximal colon consistent with enteritis.  There is no high-grade small bowel obstruction.  There is no ureteral calculus.  The patient was started on IV fluids and IV steroids.   Assessment/Plan:  Acute respiratory failure with hypoxia -Presented with some dyspnea and hypoxia -secondary to COVID-19 infection -Stable on 3 L nasal cannula>>weaned to RA -Start IV Solu-Medrol -Check inflammatory markers -received 3 days levophed  Hypotension -BPs trending down despite fluid resuscitation -PCT 2.41>>0.79 -blood cultures x 2 -start linezolid/cefepime -lactic 1.9 -start levophed   Diarrhea -Check C. Difficile--no BM -Stool pathogen panel-no BM -Likely secondary to COVID-19   Acute kidney injury -Baseline creatinine 1.3-1.6 -Presented with serum creatinine 8.37>>3.25 -Secondary to volume depletion -Continue IV fluids -appreciate nephrology   Elevated troponin -Secondary to demand  ischemia -Echo EF 60-65%, G1DD, normal RVF -Troponin trend is flat -Personally reviewed EKG--sinus rhythm, no ST-T wave change.   Essential hypertension -Holding amlodipine, ARB, HCTZ secondary to soft blood pressure   Hypokalemia -Replete -Check magnesium 2.7   Tobacco abuse -Tobacco cessation discussed              The patient is critically ill with multiple organ systems failure and requires high complexity decision making for assessment and support, frequent evaluation and titration of therapies, application of advanced monitoring technologies and extensive interpretation of multiple databases.  Critical care time - 35 mins.         Family Communication:  daughter udpated 9/18   Consultants:  none   Code Status:  FULL    DVT Prophylaxis:  Barney Heparin      Procedures: As Listed in Progress Note Above   Antibiotics: Zyvox 9/19>> Cefepime 9/19>>        Subjective: Pt complains of generalized weakness.  Denies n/v/d, abd pain.  Cp improved and sob improved.  Objective: Vitals:   10/24/22 0902 10/24/22 0904 10/24/22 0906 10/24/22 1003  BP:    (!) 92/39  Pulse:    71  Resp:    20  Temp:      TempSrc:      SpO2: 94% 100% 100% 92%  Weight:      Height:        Intake/Output Summary (Last 24 hours) at 10/24/2022 1143 Last data filed at 10/24/2022 0800 Gross per 24 hour  Intake 3620.36 ml  Output 1300 ml  Net 2320.36 ml   Weight change: 0.153 kg Exam:  General:  Pt is alert, follows commands appropriately, not in acute distress HEENT:  No icterus, No thrush, No neck mass, [place]/AT Cardiovascular: RRR, S1/S2, no rubs, no gallops Respiratory: bibasilar rales.  Bibasilar wheeze Abdomen: Soft/+BS, non tender, non distended, no guarding Extremities: No edema, No lymphangitis, No petechiae, No rashes, no synovitis   Data Reviewed: I have personally reviewed following labs and imaging studies Basic Metabolic Panel: Recent Labs  Lab 10/22/22 1735  10/23/22 0340 10/24/22 0534  NA 132* 132* 136  K 3.3* 2.8* 3.0*  CL 87* 97* 102  CO2 17* 17* 21*  GLUCOSE 112* 124* 152*  BUN 73* 73* 76*  CREATININE 8.37* 6.07* 3.25*  CALCIUM 9.2 8.3* 9.0  MG  --  2.7*  --    Liver Function Tests: Recent Labs  Lab 10/22/22 1735 10/23/22 0340 10/24/22 0534  AST 56* 44* 34  ALT 47* 34 32  ALKPHOS 91 68 58  BILITOT 0.5 0.5 0.5  PROT 9.0* 6.6 6.3*  ALBUMIN 5.0 3.6 3.4*   Recent Labs  Lab 10/22/22 1735  LIPASE 89*   No results for input(s): "AMMONIA" in the last 168 hours. Coagulation Profile: No results for input(s): "INR", "PROTIME" in the last 168 hours. CBC: Recent Labs  Lab 10/22/22 1735 10/23/22 0340 10/24/22 0534  WBC 12.2* 10.5 11.1*  NEUTROABS  --  9.4*  --   HGB 17.7* 15.5 14.3  HCT 53.4* 45.5 42.6  MCV 79.8* 78.4* 77.9*  PLT 546* 442* 461*   Cardiac Enzymes: No results for input(s): "CKTOTAL", "CKMB", "CKMBINDEX", "TROPONINI" in the last 168 hours. BNP: Invalid input(s): "POCBNP" CBG: No results for input(s): "GLUCAP" in the last 168 hours. HbA1C: No results for input(s): "HGBA1C" in the last 72 hours. Urine analysis:    Component Value Date/Time   COLORURINE YELLOW 10/22/2022 2144   APPEARANCEUR HAZY (A) 10/22/2022 2144   LABSPEC 1.010 10/22/2022 2144   PHURINE 5.0 10/22/2022 2144   GLUCOSEU NEGATIVE 10/22/2022 2144   HGBUR LARGE (A) 10/22/2022 2144   BILIRUBINUR NEGATIVE 10/22/2022 2144   BILIRUBINUR negative 10/31/2019 1603   KETONESUR NEGATIVE 10/22/2022 2144   PROTEINUR 100 (A) 10/22/2022 2144   UROBILINOGEN 0.2 10/31/2019 1603   NITRITE NEGATIVE 10/22/2022 2144   LEUKOCYTESUR NEGATIVE 10/22/2022 2144   Sepsis Labs: @LABRCNTIP (procalcitonin:4,lacticidven:4) ) Recent Results (from the past 240 hour(s))  SARS Coronavirus 2 by RT PCR (hospital order, performed in [facility] hospital lab) *cepheid single result test* Anterior Nasal Swab     Status: Abnormal   Collection Time: 10/22/22  3:41 PM    Specimen: Anterior Nasal Swab  Result Value Ref Range Status   SARS Coronavirus 2 by RT PCR POSITIVE (A) NEGATIVE Final    Comment: (NOTE) SARS-CoV-2 target nucleic acids are DETECTED  SARS-CoV-2 RNA is generally detectable in upper respiratory specimens  during the acute phase of infection.  Positive results are indicative  of the presence of the identified virus, but do not rule out bacterial infection or co-infection with other pathogens not detected by the test.  Clinical correlation with patient history and  other diagnostic information is necessary to determine patient infection status.  The expected result is negative.  Fact Sheet for Patients:   RoadLapTop.co.za   Fact Sheet for Healthcare Providers:   http://kim-miller.com/    This test is not yet approved or cleared by the [place] FDA and  has been authorized for detection and/or diagnosis of SARS-CoV-2 by FDA under an Emergency Use Authorization (EUA).  This EUA will remain in effect (meaning this test can be used) for the duration of  the COVID-19 declaration under Section 564(b)(1)  of the Act, 21 U.S.C. section 360-bbb-3(b)(1), unless the authorization is terminated or revoked sooner.   Performed at [facility], [address] [place], [place] [address]   Culture, blood (Routine X 2) w Reflex to ID Panel     Status: None (Preliminary result)   Collection Time: 10/24/22 10:38 AM   Specimen: BLOOD LEFT HAND  Result Value Ref Range Status   Specimen Description BLOOD LEFT HAND  Final   Special Requests   Final    BOTTLES DRAWN AEROBIC AND ANAEROBIC Blood Culture adequate volume Performed at [facility], [address], [place], [place] [address]    Culture PENDING  Incomplete   Report Status PENDING  Incomplete  Culture, blood (Routine X 2) w Reflex to ID Panel     Status: None (Preliminary result)   Collection Time: 10/24/22 10:55 AM   Specimen: BLOOD RIGHT  HAND  Result Value Ref Range Status   Specimen Description BLOOD RIGHT HAND  Final   Special Requests   Final    BOTTLES DRAWN AEROBIC AND ANAEROBIC Blood Culture adequate volume Performed at [facility], [address] [place], [place] [address]    Culture PENDING  Incomplete   Report Status PENDING  Incomplete     Scheduled Meds:  arformoterol  15 mcg Nebulization BID   budesonide (PULMICORT) nebulizer solution  0.5 mg Nebulization BID   Chlorhexidine Gluconate Cloth  6 each Topical Q0600   heparin  5,000 Units Subcutaneous Q8H   influenza vaccine adjuvanted  0.5 mL Intramuscular Tomorrow-1000   ipratropium-albuterol  3 mL Nebulization BID   linezolid  600 mg Oral Q12H   methylPREDNISolone (SOLU-MEDROL) injection  1 mg/kg Intravenous Q12H   Followed by   Melene Muller ON 10/26/2022] predniSONE  50 mg Oral Daily   potassium chloride  40 mEq Oral BID   Continuous Infusions:  ceFEPime (MAXIPIME) IV 2 g (10/24/22 1139)   lactated ringers 150 mL/hr at 10/24/22 0800    Procedures/Studies: ECHOCARDIOGRAM COMPLETE  Result Date: 10/23/2022    ECHOCARDIOGRAM REPORT   Patient Name:   DAEVIN SWIERK Date of Exam: 10/23/2022 Medical Rec #:  [id number]        Height:       70.0 in Accession #:    [id number]       Weight:       199.7 lb Date of Birth:  09-01-1955       BSA:          2.086 m Patient Age:    66 years         BP:           88/49 mmHg Patient Gender: M                HR:           67 bpm. Exam Location:  Inpatient Procedure: 2D Echo, Cardiac Doppler and Color Doppler Indications:    Chest Pain R07.9  History:        Patient has no prior history of Echocardiogram examinations.                 Risk Factors:Hypertension, Dyslipidemia and Current Smoker.  Sonographer:    Lucendia Herrlich RCS Referring Phys: [id number] Corleone Biegler  Sonographer Comments: Image acquisition challenging due to uncooperative patient. IMPRESSIONS  1. Left ventricular ejection fraction, by estimation, is 60 to 65%. The left  ventricle has normal function. Left ventricular endocardial border not  optimally defined to evaluate regional wall motion. Left ventricular diastolic parameters are consistent with Grade I diastolic dysfunction (impaired relaxation).  2. Right ventricular systolic function is normal. The right ventricular size is normal.  3. The mitral valve is normal in structure. No evidence of mitral valve regurgitation. No evidence of mitral stenosis.  4. The aortic valve has an indeterminant number of cusps. There is mild calcification of the aortic valve. Aortic valve regurgitation is not visualized. No aortic stenosis is present. Comparison(s): No prior Echocardiogram. FINDINGS  Left Ventricle: Left ventricular ejection fraction, by estimation, is 60 to 65%. The left ventricle has normal function. Left ventricular endocardial border not optimally defined to evaluate regional wall motion. The left ventricular internal cavity size was normal in size. There is no left ventricular hypertrophy. Left ventricular diastolic parameters are consistent with Grade I diastolic dysfunction (impaired relaxation). Right Ventricle: The right ventricular size is normal. No increase in right ventricular wall thickness. Right ventricular systolic function is normal. Left Atrium: Left atrial size was normal in size. Right Atrium: Right atrial size was normal in size. Pericardium: There is no evidence of pericardial effusion. Mitral Valve: The mitral valve is normal in structure. No evidence of mitral valve regurgitation. No evidence of mitral valve stenosis. Tricuspid Valve: The tricuspid valve is grossly normal. Tricuspid valve regurgitation is not demonstrated. No evidence of tricuspid stenosis. Aortic Valve: The aortic valve has an indeterminant number of cusps. There is mild calcification of the aortic valve. Aortic valve regurgitation is not visualized. No aortic stenosis is present. Aortic valve peak gradient measures 12.2 mmHg. Pulmonic  Valve: The pulmonic valve was grossly normal. Pulmonic valve regurgitation is not visualized. No evidence of pulmonic stenosis. Aorta: The aortic root and ascending aorta are structurally normal, with no evidence of dilitation. Venous: The inferior vena cava was not well visualized. IAS/Shunts: The interatrial septum was not assessed.  LEFT VENTRICLE PLAX 2D LVIDd:         4.00 cm   Diastology LVIDs:         2.60 cm   LV e' medial:    6.31 cm/s LV PW:         1.10 cm   LV E/e' medial:  11.0 LV IVS:        1.00 cm   LV e' lateral:   10.30 cm/s LVOT diam:     2.00 cm   LV E/e' lateral: 6.7 LV SV:         70 LV SV Index:   33 LVOT Area:     3.14 cm  RIGHT VENTRICLE RV S prime:     11.70 cm/s TAPSE (M-mode): 1.5 cm LEFT ATRIUM           Index        RIGHT ATRIUM           Index LA diam:      2.80 cm 1.34 cm/m   RA Area:     15.30 cm LA Vol (A4C): 25.7 ml 12.32 ml/m  RA Volume:   40.70 ml  19.51 ml/m  AORTIC VALVE AV Area (Vmax): 2.32 cm AV Vmax:        175.00 cm/s AV Peak Grad:   12.2 mmHg LVOT Vmax:      129.00 cm/s LVOT Vmean:     78.033 cm/s LVOT VTI:       0.222 m  AORTA Ao Root diam: 3.20 cm Ao Asc diam:  3.70 cm MITRAL VALVE  TRICUSPID VALVE MV Area (PHT): 2.83 cm    TR Peak grad:   9.5 mmHg MV Decel Time: 268 msec    TR Vmax:        154.00 cm/s MV E velocity: 69.40 cm/s MV A velocity: 81.00 cm/s  SHUNTS MV E/A ratio:  0.86        Systemic VTI:  0.22 m                            Systemic Diam: 2.00 cm Vishnu Priya Mallipeddi Electronically signed by Winfield Rast Mallipeddi Signature Date/Time: 10/23/2022/12:27:37 PM    Final    CT ABDOMEN PELVIS WO CONTRAST  Result Date: 10/22/2022 CLINICAL DATA:  Epigastric pain, renal insufficiency EXAM: CT ABDOMEN AND PELVIS WITHOUT CONTRAST TECHNIQUE: Multidetector CT imaging of the abdomen and pelvis was performed following the standard protocol without IV contrast. RADIATION DOSE REDUCTION: This exam was performed according to the departmental  dose-optimization program which includes automated exposure control, adjustment of the mA and/or kV according to patient size and/or use of iterative reconstruction technique. COMPARISON:  None Available. FINDINGS: Lower chest: Subsegmental atelectasis or scarring within the lingula. No acute pleural or parenchymal lung disease. Hepatobiliary: Unremarkable unenhanced appearance of the liver and gallbladder. Pancreas: Unremarkable unenhanced appearance. Spleen: Unremarkable unenhanced appearance. Adrenals/Urinary Tract: No urinary tract calculi or obstructive uropathy within either kidney. Exophytic 2.9 x 2.3 cm hypodensity lower pole left kidney measures 16 HU, compatible with a cyst. No specific imaging follow-up is recommended. The adrenals and bladder are unremarkable. Stomach/Bowel: No evidence of high-grade bowel obstruction. Scattered gas fluid levels are seen throughout the jejunum, ileum, and proximal colon, which may reflect ileus or enteritis. Normal appendix right lower quadrant. No bowel wall thickening or inflammatory change. Vascular/Lymphatic: Aortic atherosclerosis. No enlarged abdominal or pelvic lymph nodes. Reproductive: Prostate is unremarkable. Other: No free fluid or free intraperitoneal gas. No abdominal wall hernia. Musculoskeletal: No acute or destructive bony abnormalities. Chronic compression deformity superior endplate L1 and T11 vertebral bodies. Reconstructed images demonstrate no additional findings. IMPRESSION: 1. Scattered gas fluid levels throughout the distal small bowel and proximal colon, compatible with ileus or enteritis. No evidence of high-grade bowel obstruction. 2. No evidence of urinary tract calculi or obstructive uropathy. 3.  Aortic Atherosclerosis (ICD10-I70.0). Electronically Signed   By: Sharlet Salina M.D.   On: 10/22/2022 20:49   DG Chest 2 View  Result Date: 10/22/2022 CLINICAL DATA:  Chest pain. EXAM: CHEST - 2 VIEW COMPARISON:  10/31/2019. FINDINGS:  Bilateral lung fields are clear. Bilateral costophrenic angles are clear. Normal cardio-mediastinal silhouette. No acute osseous abnormalities. The soft tissues are within normal limits. IMPRESSION: No active cardiopulmonary disease. Electronically Signed   By: Jules Schick M.D.   On: 10/22/2022 16:49    Catarina Hartshorn, DO  [facility]  If 7PM-7AM, please contact night-coverage [email or web address] Password [facility] 10/24/2022, 11:43 AM   LOS: 2 days

## 2022-10-24 NOTE — Plan of Care (Signed)
  Problem: Education: Goal: Knowledge of risk factors and measures for prevention of condition will improve Outcome: Progressing   Problem: Coping: Goal: Psychosocial and spiritual needs will be supported Outcome: Progressing   Problem: Respiratory: Goal: Will maintain a patent airway Outcome: Progressing Goal: Complications related to the disease process, condition or treatment will be avoided or minimized Outcome: Progressing   Problem: Education: Goal: Knowledge of General Education information will improve Description: Including pain rating scale, medication(s)/side effects and non-pharmacologic comfort measures Outcome: Progressing   Problem: Clinical Measurements: Goal: Ability to maintain clinical measurements within normal limits will improve Outcome: Progressing Goal: Will remain free from infection Outcome: Progressing Goal: Diagnostic test results will improve Outcome: Progressing Goal: Respiratory complications will improve Outcome: Progressing Goal: Cardiovascular complication will be avoided Outcome: Progressing   Problem: Health Behavior/Discharge Planning: Goal: Ability to manage health-related needs will improve Outcome: Progressing

## 2022-10-24 NOTE — Progress Notes (Signed)
Patient refused breathing treatments tonight. 98% on room air at this time. Told patient to call if he became SOB and needed a breathing treatment tonight.

## 2022-10-24 NOTE — TOC CM/SW Note (Signed)
TOC consulted for discharge needs. CSW noted that PT eval has been cancelled for today and will be attempted again tmrw. TOC to follow for PT recommendations and assist with D/C planning at that time. TOC to follow.

## 2022-10-25 DIAGNOSIS — J9601 Acute respiratory failure with hypoxia: Secondary | ICD-10-CM | POA: Diagnosis not present

## 2022-10-25 DIAGNOSIS — N179 Acute kidney failure, unspecified: Secondary | ICD-10-CM | POA: Diagnosis not present

## 2022-10-25 DIAGNOSIS — U071 COVID-19: Secondary | ICD-10-CM | POA: Diagnosis not present

## 2022-10-25 DIAGNOSIS — R1310 Dysphagia, unspecified: Secondary | ICD-10-CM

## 2022-10-25 DIAGNOSIS — R1312 Dysphagia, oropharyngeal phase: Secondary | ICD-10-CM

## 2022-10-25 LAB — RENAL FUNCTION PANEL
Albumin: 2.9 g/dL — ABNORMAL LOW (ref 3.5–5.0)
Anion gap: 11 (ref 5–15)
BUN: 59 mg/dL — ABNORMAL HIGH (ref 8–23)
CO2: 23 mmol/L (ref 22–32)
Calcium: 8.7 mg/dL — ABNORMAL LOW (ref 8.9–10.3)
Chloride: 104 mmol/L (ref 98–111)
Creatinine, Ser: 2.16 mg/dL — ABNORMAL HIGH (ref 0.61–1.24)
GFR, Estimated: 33 mL/min — ABNORMAL LOW (ref >60)
Glucose, Bld: 160 mg/dL — ABNORMAL HIGH (ref 70–99)
Phosphorus: 2.3 mg/dL — ABNORMAL LOW (ref 2.5–4.6)
Potassium: 3.4 mmol/L — ABNORMAL LOW (ref 3.5–5.1)
Sodium: 138 mmol/L (ref 135–145)

## 2022-10-25 LAB — C-REACTIVE PROTEIN: CRP: 1.1 mg/dL — ABNORMAL HIGH (ref <1.0)

## 2022-10-25 LAB — FERRITIN: Ferritin: 188 ng/mL (ref 24–336)

## 2022-10-25 LAB — D-DIMER, QUANTITATIVE: D-Dimer, Quant: 0.48 ug/mL-FEU (ref 0.00–0.50)

## 2022-10-25 MED ORDER — METHYLPREDNISOLONE SODIUM SUCC 125 MG IJ SOLR
60.0000 mg | Freq: Two times a day (BID) | INTRAMUSCULAR | Status: AC
  Administered 2022-10-25 – 2022-10-27 (×6): 60 mg via INTRAVENOUS
  Filled 2022-10-25 (×6): qty 2

## 2022-10-25 MED ORDER — PANTOPRAZOLE SODIUM 40 MG IV SOLR
40.0000 mg | Freq: Every day | INTRAVENOUS | Status: DC
  Administered 2022-10-25 – 2022-10-27 (×3): 40 mg via INTRAVENOUS
  Filled 2022-10-25 (×3): qty 10

## 2022-10-25 MED ORDER — SUCRALFATE 1 GM/10ML PO SUSP
1.0000 g | Freq: Three times a day (TID) | ORAL | Status: DC
  Administered 2022-10-25 – 2022-10-28 (×10): 1 g via ORAL
  Filled 2022-10-25 (×12): qty 10

## 2022-10-25 MED ORDER — SODIUM CHLORIDE 0.9 % IV SOLN
2.0000 g | Freq: Two times a day (BID) | INTRAVENOUS | Status: DC
  Administered 2022-10-25 – 2022-10-26 (×2): 2 g via INTRAVENOUS
  Filled 2022-10-25 (×2): qty 12.5

## 2022-10-25 NOTE — Plan of Care (Signed)
  Problem: Acute Rehab PT Goals(only PT should resolve) Goal: Pt Will Go Supine/Side To Sit Outcome: Progressing Flowsheets (Taken 10/25/2022 1220) Pt will go Supine/Side to Sit:  with modified independence  with supervision Goal: Patient Will Transfer Sit To/From Stand Outcome: Progressing Flowsheets (Taken 10/25/2022 1220) Patient will transfer sit to/from stand: with supervision Goal: Pt Will Transfer Bed To Chair/Chair To Bed Outcome: Progressing Flowsheets (Taken 10/25/2022 1220) Pt will Transfer Bed to Chair/Chair to Bed: with supervision Goal: Pt Will Ambulate Outcome: Progressing Flowsheets (Taken 10/25/2022 1220) Pt will Ambulate:  25 feet  with minimal assist  with contact guard assist  with rolling walker   12:21 PM, 10/25/22 Ocie Bob, MPT Physical Therapist with [facility] [phone] office [phone] mobile phone

## 2022-10-25 NOTE — Progress Notes (Signed)
Pt stated he did not want to take breathing treatment at this time. RT instructed pt to have RT called when he is ready to take breathing treatments. RT will monitor as needed.

## 2022-10-25 NOTE — Progress Notes (Signed)
Responded to nursing call:  bradycardia 40-41  Telemetry reviewed--pt has been in sinus brady last 24 hours with HR 50s  Subjective: Pt asleep when I came into room. Aroused to voice.  Lucid and communicative when awakened.  Denies sob, dizziness. N/v.  Has had chest burning with eating or drinking any liquids or eating solids.   Vitals:   10/25/22 1430 10/25/22 1500 10/25/22 1515 10/25/22 1545  BP: 111/65 103/65 109/72   Pulse:    (!) 40  Resp: 17 17 15 17   Temp:      TempSrc:      SpO2: 94%   100%  Weight:      Height:       CV--RRR Lung--basilar rales.  No wheeze Abd--soft+BS/NT   Assessment/Plan: Sinus Bradycardia -no AV blocks on telemetry -mentation is clear and lucid -HR low 40s when sleeping, increasing to low 50s when awaken and sitting up -BP 113/67 -monitor for now;  further intervention if altered mentation and/or chronotropic incompentence or hypotension     Catarina Hartshorn, DO [facility]

## 2022-10-25 NOTE — Progress Notes (Addendum)
PROGRESS NOTE  DANTONIO WAYLAND WUJ:[id number] DOB: 29-Sep-1955 DOA: 10/22/2022 PCP: Mirna Mires, MD  Brief History:  67 year old male with a history of blindness, hypertension, hyperlipidemia, tobacco abuse, alcohol abuse presenting with 4-day history of chest pain, coughing, myalgias, and loose stools.  The patient had some nausea without emesis.  He had some exertional dyspnea.  He states that the chest pain is worsened with coughing or deep breaths.  He denies any fever, chills, headache, neck pain, abdominal pain, hematochezia, melena. In the ED, the patient had a low-grade temperature 9 9.0 F.  He was hemodynamically stable.  Oxygen saturation was 89% on room air.  The patient was placed on 3 L with saturation 93-95%.  WBC 12.2, hemoglobin 17.7, platelets 546.  Sodium 132, potassium 2.8, bicarbonate 17, serum creatinine 8.37. COVID-19 PCR was positive.  CT of the abdomen and pelvis showed bibasilar atelectasis.  There was scattered air-fluid levels throughout the distal small bowel and proximal colon consistent with enteritis.  There is no high-grade small bowel obstruction.  There is no ureteral calculus.  The patient was started on IV fluids and IV steroids.   Assessment/Plan: Acute respiratory failure with hypoxia due COVID-19 -Presented with some dyspnea and hypoxia -secondary to COVID-19 infection -Stable on 3 L nasal cannula>>weaned to RA -continue IV Solu-Medrol -CRP 4.3>>2.8 -Ferritin 503>>356>>188 -D-dimer 0.85>>0.48>>0.48 -received 3 days remdesivir   Hypotension -BPs trending down despite fluid resuscitation -UA--no pyuria -PCT 2.41>>0.79 -blood cultures x 2--neg to date -started linezolid/cefepime -lactic 1.9 -start levophed 40mcg>>>2mcg>>wean off am 9/20  Odynophagia -consult GI today   Diarrhea -Check C. Difficile--no BM -Stool pathogen panel-no BM -Likely secondary to COVID-19 -resolved   Acute kidney injury -Baseline creatinine  1.3-1.6 -Presented with serum creatinine 8.37>>3.25>>2.16 -Secondary to volume depletion -Continue IV fluids -appreciate nephrology   Elevated troponin -Secondary to demand ischemia -Echo EF 60-65%, G1DD, normal RVF -Troponin trend is flat -Personally reviewed EKG--sinus rhythm, no ST-T wave change.   Essential hypertension -Holding amlodipine, ARB, HCTZ secondary to soft blood pressure   Hypokalemia -Replete -Check magnesium 2.7   Tobacco abuse -Tobacco cessation discussed             Family Communication:  no Family at bedside  Consultants:  renal, GI  Code Status:  FULL  DVT Prophylaxis:  [place] Heparin    Procedures: As Listed in Progress Note Above  Antibiotics: Zyvox 9/19>> Cefepime 9/19>>    Subjective: Pt complains of burning with liquids and solids.  Denies n/v/d, abd pain.  Denies cp, sob, hemoptysis  Objective: Vitals:   10/25/22 0615 10/25/22 0630 10/25/22 0645 10/25/22 0700  BP: 122/79 128/66 125/80 129/72  Pulse: (!) 47 (!) 51 (!) 48 (!) 49  Resp:  13 (!) 24 17  Temp:      TempSrc:      SpO2: 100% 100% 100% 100%  Weight:      Height:        Intake/Output Summary (Last 24 hours) at 10/25/2022 0713 Last data filed at 10/25/2022 9562 Gross per 24 hour  Intake 2821.74 ml  Output 1700 ml  Net 1121.74 ml   Weight change:  Exam:  General:  Pt is alert, follows commands appropriately, not in acute distress HEENT: No icterus, No thrush, No neck mass, [place]/AT Cardiovascular: RRR, S1/S2, no rubs, no gallops Respiratory: bibasilar rales.  Bibasilar wheeze Abdomen: Soft/+BS, non tender, non distended, no guarding Extremities: No edema, No lymphangitis, No petechiae, No rashes,  no synovitis   Data Reviewed: I have personally reviewed following labs and imaging studies Basic Metabolic Panel: Recent Labs  Lab 10/22/22 1735 10/23/22 0340 10/24/22 0534 10/25/22 0455  NA 132* 132* 136 138  K 3.3* 2.8* 3.0* 3.4*  CL 87* 97* 102 104   CO2 17* 17* 21* 23  GLUCOSE 112* 124* 152* 160*  BUN 73* 73* 76* 59*  CREATININE 8.37* 6.07* 3.25* 2.16*  CALCIUM 9.2 8.3* 9.0 8.7*  MG  --  2.7*  --   --   PHOS  --   --   --  2.3*   Liver Function Tests: Recent Labs  Lab 10/22/22 1735 10/23/22 0340 10/24/22 0534 10/25/22 0455  AST 56* 44* 34  --   ALT 47* 34 32  --   ALKPHOS 91 68 58  --   BILITOT 0.5 0.5 0.5  --   PROT 9.0* 6.6 6.3*  --   ALBUMIN 5.0 3.6 3.4* 2.9*   Recent Labs  Lab 10/22/22 1735  LIPASE 89*   No results for input(s): "AMMONIA" in the last 168 hours. Coagulation Profile: No results for input(s): "INR", "PROTIME" in the last 168 hours. CBC: Recent Labs  Lab 10/22/22 1735 10/23/22 0340 10/24/22 0534  WBC 12.2* 10.5 11.1*  NEUTROABS  --  9.4*  --   HGB 17.7* 15.5 14.3  HCT 53.4* 45.5 42.6  MCV 79.8* 78.4* 77.9*  PLT 546* 442* 461*   Cardiac Enzymes: No results for input(s): "CKTOTAL", "CKMB", "CKMBINDEX", "TROPONINI" in the last 168 hours. BNP: Invalid input(s): "POCBNP" CBG: No results for input(s): "GLUCAP" in the last 168 hours. HbA1C: No results for input(s): "HGBA1C" in the last 72 hours. Urine analysis:    Component Value Date/Time   COLORURINE STRAW (A) 10/24/2022 0500   APPEARANCEUR CLEAR 10/24/2022 0500   LABSPEC 1.016 10/24/2022 0500   PHURINE 5.0 10/24/2022 0500   GLUCOSEU NEGATIVE 10/24/2022 0500   HGBUR MODERATE (A) 10/24/2022 0500   BILIRUBINUR NEGATIVE 10/24/2022 0500   BILIRUBINUR negative 10/31/2019 1603   KETONESUR NEGATIVE 10/24/2022 0500   PROTEINUR 30 (A) 10/24/2022 0500   UROBILINOGEN 0.2 10/31/2019 1603   NITRITE NEGATIVE 10/24/2022 0500   LEUKOCYTESUR NEGATIVE 10/24/2022 0500   Sepsis Labs: @LABRCNTIP (procalcitonin:4,lacticidven:4) ) Recent Results (from the past 240 hour(s))  SARS Coronavirus 2 by RT PCR (hospital order, performed in [facility] hospital lab) *cepheid single result test* Anterior Nasal Swab     Status: Abnormal   Collection Time:  10/22/22  3:41 PM   Specimen: Anterior Nasal Swab  Result Value Ref Range Status   SARS Coronavirus 2 by RT PCR POSITIVE (A) NEGATIVE Final    Comment: (NOTE) SARS-CoV-2 target nucleic acids are DETECTED  SARS-CoV-2 RNA is generally detectable in upper respiratory specimens  during the acute phase of infection.  Positive results are indicative  of the presence of the identified virus, but do not rule out bacterial infection or co-infection with other pathogens not detected by the test.  Clinical correlation with patient history and  other diagnostic information is necessary to determine patient infection status.  The expected result is negative.  Fact Sheet for Patients:   RoadLapTop.co.za   Fact Sheet for Healthcare Providers:   http://kim-miller.com/    This test is not yet approved or cleared by the [place] FDA and  has been authorized for detection and/or diagnosis of SARS-CoV-2 by FDA under an Emergency Use Authorization (EUA).  This EUA will remain in effect (meaning this test can be  used) for the duration of  the COVID-19 declaration under Section 564(b)(1)  of the Act, 21 U.S.C. section 360-bbb-3(b)(1), unless the authorization is terminated or revoked sooner.   Performed at [facility], [address], [place], [place] [address]   Culture, blood (Routine X 2) w Reflex to ID Panel     Status: None (Preliminary result)   Collection Time: 10/24/22 10:38 AM   Specimen: BLOOD LEFT HAND  Result Value Ref Range Status   Specimen Description BLOOD LEFT HAND  Final   Special Requests   Final    BOTTLES DRAWN AEROBIC AND ANAEROBIC Blood Culture adequate volume   Culture   Final    NO GROWTH < 24 HOURS Performed at [facility], [address], [place], [place] [address]    Report Status PENDING  Incomplete  Culture, blood (Routine X 2) w Reflex to ID Panel     Status: None (Preliminary result)   Collection Time: 10/24/22  10:55 AM   Specimen: BLOOD RIGHT HAND  Result Value Ref Range Status   Specimen Description BLOOD RIGHT HAND  Final   Special Requests   Final    BOTTLES DRAWN AEROBIC AND ANAEROBIC Blood Culture adequate volume   Culture   Final    NO GROWTH < 24 HOURS Performed at [facility], [address], [place], [place] [address]    Report Status PENDING  Incomplete  MRSA Next Gen by PCR, Nasal     Status: None   Collection Time: 10/24/22 11:49 AM   Specimen: Nasal Mucosa; Nasal Swab  Result Value Ref Range Status   MRSA by PCR Next Gen NOT DETECTED NOT DETECTED Final    Comment: (NOTE) The GeneXpert MRSA Assay (FDA approved for NASAL specimens only), is one component of a comprehensive MRSA colonization surveillance program. It is not intended to diagnose MRSA infection nor to guide or monitor treatment for MRSA infections. Test performance is not FDA approved in patients less than 33 years old. Performed at [facility], [address], [place], [place] [address]      Scheduled Meds:  arformoterol  15 mcg Nebulization BID   budesonide (PULMICORT) nebulizer solution  0.5 mg Nebulization BID   Chlorhexidine Gluconate Cloth  6 each Topical Q0600   heparin  5,000 Units Subcutaneous Q8H   influenza vaccine adjuvanted  0.5 mL Intramuscular Tomorrow-1000   ipratropium-albuterol  3 mL Nebulization BID   linezolid  600 mg Oral Q12H   methylPREDNISolone (SOLU-MEDROL) injection  1 mg/kg Intravenous Q12H   Followed by   Melene Muller ON 10/26/2022] predniSONE  50 mg Oral Daily   potassium chloride  40 mEq Oral BID   sodium chloride flush  10-40 mL Intracatheter Q12H   Continuous Infusions:  sodium chloride     ceFEPime (MAXIPIME) IV 200 mL/hr at 10/25/22 0347   lactated ringers 150 mL/hr at 10/25/22 4259   norepinephrine (LEVOPHED) Adult infusion 2 mcg/min (10/25/22 0712)    Procedures/Studies: [place] EKG SITE RITE  Result Date: 10/24/2022 If Site Rite image not attached, placement could not be  confirmed due to current cardiac rhythm.  ECHOCARDIOGRAM COMPLETE  Result Date: 10/23/2022    ECHOCARDIOGRAM REPORT   Patient Name:   GEOVANNY PEDREGON Date of Exam: 10/23/2022 Medical Rec #:  [id number]        Height:       70.0 in Accession #:    [id number]       Weight:       199.7 lb Date  of Birth:  1955-05-07       BSA:          2.086 m Patient Age:    66 years         BP:           88/49 mmHg Patient Gender: M                HR:           67 bpm. Exam Location:  Inpatient Procedure: 2D Echo, Cardiac Doppler and Color Doppler Indications:    Chest Pain R07.9  History:        Patient has no prior history of Echocardiogram examinations.                 Risk Factors:Hypertension, Dyslipidemia and Current Smoker.  Sonographer:    Lucendia Herrlich RCS Referring Phys: [phone] Glora Hulgan  Sonographer Comments: Image acquisition challenging due to uncooperative patient. IMPRESSIONS  1. Left ventricular ejection fraction, by estimation, is 60 to 65%. The left ventricle has normal function. Left ventricular endocardial border not optimally defined to evaluate regional wall motion. Left ventricular diastolic parameters are consistent with Grade I diastolic dysfunction (impaired relaxation).  2. Right ventricular systolic function is normal. The right ventricular size is normal.  3. The mitral valve is normal in structure. No evidence of mitral valve regurgitation. No evidence of mitral stenosis.  4. The aortic valve has an indeterminant number of cusps. There is mild calcification of the aortic valve. Aortic valve regurgitation is not visualized. No aortic stenosis is present. Comparison(s): No prior Echocardiogram. FINDINGS  Left Ventricle: Left ventricular ejection fraction, by estimation, is 60 to 65%. The left ventricle has normal function. Left ventricular endocardial border not optimally defined to evaluate regional wall motion. The left ventricular internal cavity size was normal in size. There is no left ventricular  hypertrophy. Left ventricular diastolic parameters are consistent with Grade I diastolic dysfunction (impaired relaxation). Right Ventricle: The right ventricular size is normal. No increase in right ventricular wall thickness. Right ventricular systolic function is normal. Left Atrium: Left atrial size was normal in size. Right Atrium: Right atrial size was normal in size. Pericardium: There is no evidence of pericardial effusion. Mitral Valve: The mitral valve is normal in structure. No evidence of mitral valve regurgitation. No evidence of mitral valve stenosis. Tricuspid Valve: The tricuspid valve is grossly normal. Tricuspid valve regurgitation is not demonstrated. No evidence of tricuspid stenosis. Aortic Valve: The aortic valve has an indeterminant number of cusps. There is mild calcification of the aortic valve. Aortic valve regurgitation is not visualized. No aortic stenosis is present. Aortic valve peak gradient measures 12.2 mmHg. Pulmonic Valve: The pulmonic valve was grossly normal. Pulmonic valve regurgitation is not visualized. No evidence of pulmonic stenosis. Aorta: The aortic root and ascending aorta are structurally normal, with no evidence of dilitation. Venous: The inferior vena cava was not well visualized. IAS/Shunts: The interatrial septum was not assessed.  LEFT VENTRICLE PLAX 2D LVIDd:         4.00 cm   Diastology LVIDs:         2.60 cm   LV e' medial:    6.31 cm/s LV PW:         1.10 cm   LV E/e' medial:  11.0 LV IVS:        1.00 cm   LV e' lateral:   10.30 cm/s LVOT diam:     2.00 cm   LV  E/e' lateral: 6.7 LV SV:         70 LV SV Index:   33 LVOT Area:     3.14 cm  RIGHT VENTRICLE RV S prime:     11.70 cm/s TAPSE (M-mode): 1.5 cm LEFT ATRIUM           Index        RIGHT ATRIUM           Index LA diam:      2.80 cm 1.34 cm/m   RA Area:     15.30 cm LA Vol (A4C): 25.7 ml 12.32 ml/m  RA Volume:   40.70 ml  19.51 ml/m  AORTIC VALVE AV Area (Vmax): 2.32 cm AV Vmax:        175.00 cm/s AV  Peak Grad:   12.2 mmHg LVOT Vmax:      129.00 cm/s LVOT Vmean:     78.033 cm/s LVOT VTI:       0.222 m  AORTA Ao Root diam: 3.20 cm Ao Asc diam:  3.70 cm MITRAL VALVE               TRICUSPID VALVE MV Area (PHT): 2.83 cm    TR Peak grad:   9.5 mmHg MV Decel Time: 268 msec    TR Vmax:        154.00 cm/s MV E velocity: 69.40 cm/s MV A velocity: 81.00 cm/s  SHUNTS MV E/A ratio:  0.86        Systemic VTI:  0.22 m                            Systemic Diam: 2.00 cm Vishnu Priya Mallipeddi Electronically signed by Winfield Rast Mallipeddi Signature Date/Time: 10/23/2022/12:27:37 PM    Final    CT ABDOMEN PELVIS WO CONTRAST  Result Date: 10/22/2022 CLINICAL DATA:  Epigastric pain, renal insufficiency EXAM: CT ABDOMEN AND PELVIS WITHOUT CONTRAST TECHNIQUE: Multidetector CT imaging of the abdomen and pelvis was performed following the standard protocol without IV contrast. RADIATION DOSE REDUCTION: This exam was performed according to the departmental dose-optimization program which includes automated exposure control, adjustment of the mA and/or kV according to patient size and/or use of iterative reconstruction technique. COMPARISON:  None Available. FINDINGS: Lower chest: Subsegmental atelectasis or scarring within the lingula. No acute pleural or parenchymal lung disease. Hepatobiliary: Unremarkable unenhanced appearance of the liver and gallbladder. Pancreas: Unremarkable unenhanced appearance. Spleen: Unremarkable unenhanced appearance. Adrenals/Urinary Tract: No urinary tract calculi or obstructive uropathy within either kidney. Exophytic 2.9 x 2.3 cm hypodensity lower pole left kidney measures 16 HU, compatible with a cyst. No specific imaging follow-up is recommended. The adrenals and bladder are unremarkable. Stomach/Bowel: No evidence of high-grade bowel obstruction. Scattered gas fluid levels are seen throughout the jejunum, ileum, and proximal colon, which may reflect ileus or enteritis. Normal appendix right  lower quadrant. No bowel wall thickening or inflammatory change. Vascular/Lymphatic: Aortic atherosclerosis. No enlarged abdominal or pelvic lymph nodes. Reproductive: Prostate is unremarkable. Other: No free fluid or free intraperitoneal gas. No abdominal wall hernia. Musculoskeletal: No acute or destructive bony abnormalities. Chronic compression deformity superior endplate L1 and T11 vertebral bodies. Reconstructed images demonstrate no additional findings. IMPRESSION: 1. Scattered gas fluid levels throughout the distal small bowel and proximal colon, compatible with ileus or enteritis. No evidence of high-grade bowel obstruction. 2. No evidence of urinary tract calculi or obstructive uropathy. 3.  Aortic Atherosclerosis (ICD10-I70.0). Electronically Signed  By: Sharlet Salina M.D.   On: 10/22/2022 20:49   DG Chest 2 View  Result Date: 10/22/2022 CLINICAL DATA:  Chest pain. EXAM: CHEST - 2 VIEW COMPARISON:  10/31/2019. FINDINGS: Bilateral lung fields are clear. Bilateral costophrenic angles are clear. Normal cardio-mediastinal silhouette. No acute osseous abnormalities. The soft tissues are within normal limits. IMPRESSION: No active cardiopulmonary disease. Electronically Signed   By: Jules Schick M.D.   On: 10/22/2022 16:49    Catarina Hartshorn, DO  [facility]  If 7PM-7AM, please contact night-coverage [email or web address] Password [email or web address] 10/25/2022, 7:13 AM   LOS: 3 days

## 2022-10-25 NOTE — Progress Notes (Addendum)
[name] Refused his neb treatments ,"states he is fine will call for neb if he needs it".

## 2022-10-25 NOTE — Evaluation (Signed)
Occupational Therapy Evaluation Patient Details Name: [name] MRN: [id number] DOB: 1955/05/01 Today's Date: 10/25/2022   History of Present Illness [name] is a 67 y.o. male with medical history significant of blindness, hyperlipidemia, hypertension, presents to the ED with a chief complaint of being very sick.  Patient reports that he has had a headache, chest pain, leg aches, diarrhea, and more.  He reports the symptoms started about 4 days ago.  He reports bowel and urine incontinence due to such frequent diarrhea.  He reports diarrhea was happening about every hour.  There was no melena, no hematochezia.  He reports the consistency was like water.  He has been nauseous but had no vomiting.  Patient reports he has not had any p.o. intake in last 4 days.  He has had some shortness of breath and a cough.  The cough is dry.  Patient reports that his leg aches are like cramping.  His headache feels like a pressure.  His chest pain is worse with coughing and deep inspiration.  He had 1 sick contact but is not sure if that person had COVID.  Patient has been vaccinated for COVID and had boosters up until this year.  He was planning to get his next booster October 1.  Patient cannot tell if he feels better after treatment in the ED or not since he has just been trying to sleep.  Patient is a current smoker but declines nicotine patch at this time.  He very seldomly drinks.  Patient is full code.  Review of Systems: As mentioned in the history of present illness. All other systems reviewed and are negative. (per DO)   Clinical Impression   Pt agreeable to OT and PT co-evaluation. Pt reports independence with ADL's at baseline. Pt is blind at baseline. Today pt was unsteady in standing and generally weak. Noted to fatigue quickly with a few steps away from the bed without AD. Legs appeared to start giving way at times. Pt was assisted to the chair and left there with call bell within reach. Pt  reports 3 to 4 falls in the past 6 months and does not feel safe going  home at this time with his minimal home support. Pt will benefit from continued OT in the hospital and recommended venue below to increase strength, balance, and endurance for safe ADL's.          If plan is discharge home, recommend the following: A little help with walking and/or transfers;A little help with bathing/dressing/bathroom;Assistance with cooking/housework;Assist for transportation;Help with stairs or ramp for entrance    Functional Status Assessment  Patient has had a recent decline in their functional status and demonstrates the ability to make significant improvements in function in a reasonable and predictable amount of time.  Equipment Recommendations  None recommended by OT           Precautions / Restrictions Precautions Precautions: Fall Restrictions Weight Bearing Restrictions: No      Mobility Bed Mobility Overal bed mobility: Modified Independent             General bed mobility comments: labored movement    Transfers Overall transfer level: Needs assistance   Transfers: Sit to/from Stand, Bed to chair/wheelchair/BSC Sit to Stand: Contact guard assist     Step pivot transfers: Contact guard assist, Min assist     General transfer comment: Unsteady in standing; tactile cuing assist for pivot to chair.      Balance Overall balance  assessment: Needs assistance Sitting-balance support: No upper extremity supported, Feet supported Sitting balance-Leahy Scale: Good Sitting balance - Comments: seated at EOB   Standing balance support: No upper extremity supported, During functional activity Standing balance-Leahy Scale: Fair Standing balance comment: poor to fair without AD                           ADL either performed or assessed with clinical judgement   ADL Overall ADL's : Needs assistance/impaired     Grooming: Standing;Minimal assistance   Upper  Body Bathing: Set up;Sitting   Lower Body Bathing: Contact guard assist;Sitting/lateral leans   Upper Body Dressing : Set up;Sitting   Lower Body Dressing: Contact guard assist;Sitting/lateral leans   Toilet Transfer: Contact guard assist;Minimal assistance;Ambulation Toilet Transfer Details (indicate cue type and reason): simulated via EOB to chair transfer without AD Toileting- Clothing Manipulation and Hygiene: Set up;Contact guard assist;Sitting/lateral lean;Sit to/from stand       Functional mobility during ADLs: Minimal assistance;Contact guard assist       Vision Baseline Vision/History: 2 Legally blind Ability to See in Adequate Light: 4 Severely impaired Patient Visual Report: No change from baseline Additional Comments: highly impaired at baseline     Perception Perception: Not tested       Praxis Praxis: Not tested       Pertinent Vitals/Pain Pain Assessment Pain Assessment: Faces Faces Pain Scale: Hurts little more Pain Location: throat, chest, stomach, head Pain Descriptors / Indicators: Discomfort Pain Intervention(s): Limited activity within patient's tolerance, Monitored during session, Repositioned     Extremity/Trunk Assessment Upper Extremity Assessment Upper Extremity Assessment: Generalized weakness   Lower Extremity Assessment Lower Extremity Assessment: Defer to PT evaluation   Cervical / Trunk Assessment Cervical / Trunk Assessment: Normal   Communication Communication Communication: No apparent difficulties   Cognition Arousal: Alert Behavior During Therapy: WFL for tasks assessed/performed Overall Cognitive Status: Within Functional Limits for tasks assessed                                                        Home Living Family/patient expects to be discharged to:: Private residence Living Arrangements: Alone Available Help at Discharge: Other (Comment) (transportation assist) Type of Home: House Home  Access: Ramped entrance     Home Layout: One level     Bathroom Shower/Tub: Producer, television/film/video: Handicapped height Bathroom Accessibility: Yes How Accessible: Accessible via walker Home Equipment: Cane - single point;Grab bars - toilet;Shower seat   Additional Comments: Pt pays someone to take him to the store.      Prior Functioning/Environment Prior Level of Function : Independent/Modified Independent;History of Falls (last six months)             Mobility Comments: Ambulates with use of cane for vision deficits. ADLs Comments: Independent ADL; assist for transportation to get food.        OT Problem List: Decreased strength;Decreased range of motion;Decreased activity tolerance;Impaired balance (sitting and/or standing)      OT Treatment/Interventions: Self-care/ADL training;Therapeutic exercise;Therapeutic activities;Patient/family education;Balance training;Visual/perceptual remediation/compensation    OT Goals(Current goals can be found in the care plan section) Acute Rehab OT Goals Patient Stated Goal: Improve function OT Goal Formulation: With patient Time For Goal Achievement: 11/08/22 Potential to Achieve Goals: Good  OT Frequency: Min 2X/week    Co-evaluation PT/OT/SLP Co-Evaluation/Treatment: Yes Reason for Co-Treatment: To address functional/ADL transfers   OT goals addressed during session: ADL's and self-care                       End of Session Nurse Communication: Mobility status  Activity Tolerance: Patient tolerated treatment well Patient left: in chair;with call bell/phone within reach  OT Visit Diagnosis: Unsteadiness on feet (R26.81);Other abnormalities of gait and mobility (R26.89);Muscle weakness (generalized) (M62.81)                Time: 4259-5638 OT Time Calculation (min): 28 min Charges:  OT General Charges $OT Visit: 1 Visit OT Evaluation $OT Eval Low Complexity: 1 Low  Brahm Barbeau OT,  MOT  Danie Chandler 10/25/2022, 9:41 AM

## 2022-10-25 NOTE — TOC Progression Note (Signed)
Transition of Care [facility]) - Progression Note    Patient Details  Name: [name] MRN: [id number] Date of Birth: May 27, 1955  Transition of [facility]) CM/SW Contact  Villa Herb, [place] Phone Number: 10/25/2022, 4:15 PM  Clinical Narrative:    CSW spoke with pt who states that he wants to stay in [place] so he would like to accept bed offer from [facility]. CSW spoke with Debbie at [facility] who is reviewing to see if they are going to have a private room for pt due to COVID+ status. CSW to leave note for wknd CSW to follow up. TOC to follow.   Expected Discharge Plan: Skilled Nursing Facility Barriers to Discharge: Continued Medical Work up  Expected Discharge Plan and Services In-house Referral: Clinical Social Work Discharge Planning Services: CM Consult Post Acute Care Choice: Skilled Nursing Facility Living arrangements for the past 2 months: Single Family Home                                       Social Determinants of Health (SDOH) Interventions SDOH Screenings   Food Insecurity: Patient Declined (10/23/2022)  Housing: Low Risk  (10/23/2022)  Transportation Needs: No Transportation Needs (10/23/2022)  Utilities: Not At Risk (10/23/2022)  Stress: Stress Concern Present (10/23/2022)  Tobacco Use: High Risk (10/22/2022)    Readmission Risk Interventions    10/25/2022    9:57 AM  Readmission Risk Prevention Plan  Transportation Screening Complete  Home Care Screening Complete  Medication Review (RN CM) Complete

## 2022-10-25 NOTE — Consult Note (Signed)
Gastroenterology Consult   Referring Provider: No ref. provider found Primary Care Physician:  Mirna Mires, MD Primary Gastroenterologist:  Dr. Marletta Lor  Patient ID: [name]; [id number]; Jul 19, 1955   Admit date: 10/22/2022  LOS: 3 days   Date of Consultation: 10/25/2022  Reason for Consultation:  odynophagia  History of Present Illness   [name] is a 67 y.o. year old male with history of HLD, HTN, tobacco abuse, alcohol abuse, and blindness who presented to the hospital with chest pain, cough, myalgias, and diarrhea and was subsequently diagnosed with AKI and COVID 19. He has been experiencing mild symptoms after receiving treatment and has been in the ICU due to AKI and hypotension. GI consulted given complaint of odynophagia.   CXR 10/22/22: -Clear bilateral lung fields -Active cardiopulmonary disease  CT A/P 10/22/22 IMPRESSION: -Subsegmental atelectasis or scarring within the lingula -Scattered gas fluid levels throughout the distal small bowel and proximal colon, compatible with ileus or enteritis. -No evidence of high-grade bowel obstruction. -No evidence of urinary tract calculi or obstructive uropathy. -Exophytic hypodensity in the lower pole of left kidney, compatible with cyst  Labs on admission: Lipase 89, sodium 132, creatinine 8.37, potassium 3.3, BUN 73, AST 56, ALT 47, alk phos 91, T. bili 0.5, WBC 12.2, hemoglobin 17.7, MCV 79.8, platelets 546, magnesium 2.7.  LFTs have normalized and creatinine continues to improve.  Creatinine down to 2.16 today.  Ferritin and D-dimer have normalized.  Colonoscopy June 2022: -6 mm polyp in the cecum -2 mm polyp in the cecum -3 polyps in the transverse colon -Pathology consistent with tubular adenomas -Advised repeat colonoscopy in 3 years  Consult: Patient states that since his hospitalization he has been experiencing pain and burning with swallowing liquids, medications, and food.  He states that this  morning he tried to swallow some of his medications that were large and even though he is cut in half he was unable to tolerate them as they were burning.  He has some mild pain/burning sensation with eating to his upper stomach as well.  She denies any frequent NSAID use or alcohol use, has a previous history of this but nothing recent.  He denies any typical reflux symptoms and has not been on medication.  No history of upper endoscopy. Denies nausea, vomiting, lack of appetite, or early satiety.  He states he was not able to eat all of his breakfast this morning due to the burning but otherwise has a good appetite.  Continues to have a slight bit of cough but denies any overt shortness of breath.  Signs of dizziness or lightheadedness.  Denies sensation of food or liquids getting stuck, this morning was the only time he had difficulty swallowing his medication/feeling like it was stuck.  He does state that he wants to ensure this is not cancer or something else worse going on.  Past Medical History:  Diagnosis Date   Alcohol abuse    Blind    Hyperlipidemia    Hypertension     Past Surgical History:  Procedure Laterality Date   CATARACT EXTRACTION W/PHACO Right 09/26/2014   Procedure: CATARACT EXTRACTION PHACO AND INTRAOCULAR LENS PLACEMENT RIGHT EYE CDE=18.56;  Surgeon: Gemma Payor, MD;  Location: [facility];  Service: Ophthalmology;  Laterality: Right;   COLONOSCOPY WITH PROPOFOL N/A 07/07/2020   Procedure: COLONOSCOPY WITH PROPOFOL;  Surgeon: Lanelle Bal, DO;  Location: [facility];  Service: Endoscopy;  Laterality: N/A;  10:00am   POLYPECTOMY  07/07/2020  Procedure: POLYPECTOMY;  Surgeon: Lanelle Bal, DO;  Location: [facility];  Service: Endoscopy;;   ROTATOR CUFF REPAIR Left 2012   TOOTH EXTRACTION N/A 1996    Prior to Admission medications   Medication Sig Start Date End Date Taking? Authorizing Provider  amLODipine (NORVASC) 10 MG tablet Take 10 mg by mouth daily.   Yes  [provider]  aspirin 81 MG chewable tablet Chew 81 mg by mouth daily.   Yes [provider]  atorvastatin (LIPITOR) 10 MG tablet Take 10 mg by mouth daily.   Yes [provider]  HYDROcodone-acetaminophen (NORCO) 7.5-325 MG tablet Take 1 tablet by mouth every 6 (six) hours as needed for moderate pain. 10/01/22  Yes Darreld Mclean, MD  valsartan-hydrochlorothiazide (DIOVAN-HCT) 160-25 MG per tablet Take 1 tablet by mouth daily.   Yes [provider]    Current Facility-Administered Medications  Medication Dose Route Frequency Provider Last Rate Last Admin   0.9 %  sodium chloride infusion  250 mL Intravenous Continuous Tat, Onalee Hua, MD       acetaminophen (TYLENOL) tablet 650 mg  650 mg Oral Q6H PRN Zierle-Ghosh, Asia B, DO       Or   acetaminophen (TYLENOL) suppository 650 mg  650 mg Rectal Q6H PRN Zierle-Ghosh, Asia B, DO       arformoterol (BROVANA) nebulizer solution 15 mcg  15 mcg Nebulization BID Tat, Onalee Hua, MD   15 mcg at 10/24/22 0906   budesonide (PULMICORT) nebulizer solution 0.5 mg  0.5 mg Nebulization BID Tat, Onalee Hua, MD   0.5 mg at 10/24/22 0904   ceFEPIme (MAXIPIME) 2 g in sodium chloride 0.9 % 100 mL IVPB  2 g Intravenous Q24H Catarina Hartshorn, MD 200 mL/hr at 10/25/22 7253 Infusion Verify at 10/25/22 6644   Chlorhexidine Gluconate Cloth 2 % PADS 6 each  6 each Topical Q0600 Tat, Onalee Hua, MD   6 each at 10/24/22 0524   heparin injection 5,000 Units  5,000 Units Subcutaneous Q8H Zierle-Ghosh, Asia B, DO   5,000 Units at 10/25/22 0347   influenza vaccine adjuvanted (FLUAD) injection 0.5 mL  0.5 mL Intramuscular Tomorrow-1000 Tat, Onalee Hua, MD       ipratropium-albuterol (DUONEB) 0.5-2.5 (3) MG/3ML nebulizer solution 3 mL  3 mL Nebulization BID Tat, David, MD   3 mL at 10/24/22 0902   lactated ringers infusion   Intravenous Continuous Zierle-Ghosh, Asia B, DO 150 mL/hr at 10/25/22 4259 Infusion Verify at 10/25/22 5638   linezolid (ZYVOX) tablet 600 mg  600 mg  Oral Pablo Ledger, MD   600 mg at 10/24/22 2213   methylPREDNISolone sodium succinate (SOLU-MEDROL) 125 mg/2 mL injection 60 mg  60 mg Intravenous Q12H TatOnalee Hua, MD       norepinephrine (LEVOPHED) 4mg  in (0.016 mg/mL) premix infusion  2-10 mcg/min Intravenous Titrated Tat, Onalee Hua, MD 7.5 mL/hr at 10/25/22 0712 2 mcg/min at 10/25/22 0712   ondansetron (ZOFRAN) tablet 4 mg  4 mg Oral Q6H PRN Zierle-Ghosh, Asia B, DO       Or   ondansetron (ZOFRAN) injection 4 mg  4 mg Intravenous Q6H PRN Zierle-Ghosh, Asia B, DO       Oral care mouth rinse  15 mL Mouth Rinse PRN Tat, Onalee Hua, MD       oxyCODONE (Oxy IR/ROXICODONE) immediate release tablet 5 mg  5 mg Oral Q4H PRN Zierle-Ghosh, Asia B, DO   5 mg at 10/23/22 1940   phenol (CHLORASEPTIC) mouth spray 1 spray  1  spray Mouth/Throat PRN Tat, Onalee Hua, MD   1 spray at 10/23/22 1711   potassium chloride SA (KLOR-CON M) CR tablet 40 mEq  40 mEq Oral BID Terrial Rhodes, MD   40 mEq at 10/24/22 2213   sodium chloride flush (NS) 0.9 % injection 10-40 mL  10-40 mL Intracatheter Pablo Ledger, MD   10 mL at 10/24/22 2214   sodium chloride flush (NS) 0.9 % injection 10-40 mL  10-40 mL Intracatheter PRN Catarina Hartshorn, MD        Allergies as of 10/22/2022 - Review Complete 10/22/2022  Allergen Reaction Noted   Cortisone  08/05/2012    Family History  Problem Relation Age of Onset   Cancer Sister 37       breast ca   Hypertension Sister    Hypertension Sister    Hypertension Sister    Hypertension Sister    Hypertension Brother    Hypertension Brother    Hypertension Brother    Colon cancer Neg Hx     Social History   Socioeconomic History   Marital status: Single    Spouse name: Not on file   Number of children: Not on file   Years of education: Not on file   Highest education level: Not on file  Occupational History   Not on file  Tobacco Use   Smoking status: Every Day    Current packs/day: 0.50    Average packs/day: 0.5 packs/day  for 20.0 years (10.0 ttl pk-yrs)    Types: Cigarettes   Smokeless tobacco: Never  Vaping Use   Vaping status: Never Used  Substance and Sexual Activity   Alcohol use: Not Currently    Alcohol/week: 1.0 standard drink of alcohol    Types: 1 Glasses of wine per week    Comment: used to drink heavily. Last drank years ago.    Drug use: No   Sexual activity: Not on file  Other Topics Concern   Not on file  Social History Narrative   Not on file   Social Determinants of Health   Financial Resource Strain: Not on file  Food Insecurity: Patient Declined (10/23/2022)   Hunger Vital Sign    Worried About Running Out of Food in the Last Year: Patient declined    Ran Out of Food in the Last Year: Patient declined  Transportation Needs: No Transportation Needs (10/23/2022)   PRAPARE - Administrator, Civil Service (Medical): No    Lack of Transportation (Non-Medical): No  Physical Activity: Not on file  Stress: Stress Concern Present (10/23/2022)   [employer] of Occupational Health - Occupational Stress Questionnaire    Feeling of Stress : To some extent  Social Connections: Not on file  Intimate Partner Violence: Not At Risk (10/23/2022)   Humiliation, Afraid, Rape, and Kick questionnaire    Fear of Current or Ex-Partner: No    Emotionally Abused: No    Physically Abused: No    Sexually Abused: No     Review of Systems   Gen: Denies any fever, chills, loss of appetite, change in weight or weight loss CV: Denies chest pain, heart palpitations, syncope, edema  Resp: + cough. Denies shortness of breath with rest, wheezing, coughing up blood, and pleurisy. GI: see HPI GU : Denies urinary burning, blood in urine, urinary frequency, and urinary incontinence. MS: Denies joint pain, limitation of movement, swelling, cramps, and atrophy.  Derm: Denies rash, itching, dry skin, hives. Psych: Denies depression, anxiety, memory loss,  hallucinations, and confusion. Heme:  Denies bruising or bleeding Neuro:  + blindness. Denies any headaches, dizziness, paresthesias, shaking  Physical Exam   Vital Signs in last 24 hours: Temp:  [98.4 F (36.9 C)-99 F (37.2 C)] 98.4 F (36.9 C) (09/19 2323) Pulse Rate:  [43-133] 49 (09/20 0700) Resp:  [12-32] 17 (09/20 0700) BP: (79-153)/(31-100) 129/72 (09/20 0700) SpO2:  [92 %-100 %] 100 % (09/20 0700) Last BM Date : 10/23/22  General:   Alert,  pleasant and cooperative in NAD Head:  Normocephalic and atraumatic. Eyes:  Sclera clear, no icterus.   Conjunctiva pink. Ears:  Normal auditory acuity. Mouth:  No deformity or lesions, dentition normal. Heart:  bradycardic. Regular rhythm; no murmurs, clicks, rubs,  or gallops. Abdomen:  Soft, nontender and nondistended. No masses, hepatosplenomegaly or hernias noted. Normal bowel sounds, without guarding, and without rebound.   Rectal: deferred Neurologic:  Alert and  oriented x4. Skin:  Intact without significant lesions or rashes. Psych:  Alert and cooperative. Normal mood and affect.  Intake/Output from previous day: 09/19 0701 - 09/20 0700 In: 2394.3 [P.O.:440; I.V.:1851; IV Piggyback:103.3] Out: 1700 [Urine:1700] Intake/Output this shift: Total I/O In: 427.4 [I.V.:427.4] Out: -   Labs/Studies   Recent Labs Recent Labs    10/22/22 1735 10/23/22 0340 10/24/22 0534  WBC 12.2* 10.5 11.1*  HGB 17.7* 15.5 14.3  HCT 53.4* 45.5 42.6  PLT 546* 442* 461*   BMET Recent Labs    10/23/22 0340 10/24/22 0534 10/25/22 0455  NA 132* 136 138  K 2.8* 3.0* 3.4*  CL 97* 102 104  CO2 17* 21* 23  GLUCOSE 124* 152* 160*  BUN 73* 76* 59*  CREATININE 6.07* 3.25* 2.16*  CALCIUM 8.3* 9.0 8.7*   LFT Recent Labs    10/22/22 1735 10/23/22 0340 10/24/22 0534 10/25/22 0455  PROT 9.0* 6.6 6.3*  --   ALBUMIN 5.0 3.6 3.4* 2.9*  AST 56* 44* 34  --   ALT 47* 34 32  --   ALKPHOS 91 68 58  --   BILITOT 0.5 0.5 0.5  --    PT/INR No results for input(s):  "LABPROT", "INR" in the last 72 hours. Hepatitis Panel No results for input(s): "HEPBSAG", "HCVAB", "HEPAIGM", "HEPBIGM" in the last 72 hours. C-Diff No results for input(s): "CDIFFTOX" in the last 72 hours.  Radiology/Studies [place] EKG SITE RITE  Result Date: 10/24/2022 If Site Rite image not attached, placement could not be confirmed due to current cardiac rhythm.  ECHOCARDIOGRAM COMPLETE  Result Date: 10/23/2022    ECHOCARDIOGRAM REPORT   Patient Name:   [name] Date of Exam: 10/23/2022 Medical Rec #:  [id number]        Height:       70.0 in Accession #:    [id number]       Weight:       199.7 lb Date of Birth:  02-Apr-1955       BSA:          2.086 m Patient Age:    66 years         BP:           88/49 mmHg Patient Gender: M                HR:           67 bpm. Exam Location:  Inpatient Procedure: 2D Echo, Cardiac Doppler and Color Doppler Indications:    Chest Pain R07.9  History:  Patient has no prior history of Echocardiogram examinations.                 Risk Factors:Hypertension, Dyslipidemia and Current Smoker.  Sonographer:    Lucendia Herrlich RCS Referring Phys: [phone] DAVID TAT  Sonographer Comments: Image acquisition challenging due to uncooperative patient. IMPRESSIONS  1. Left ventricular ejection fraction, by estimation, is 60 to 65%. The left ventricle has normal function. Left ventricular endocardial border not optimally defined to evaluate regional wall motion. Left ventricular diastolic parameters are consistent with Grade I diastolic dysfunction (impaired relaxation).  2. Right ventricular systolic function is normal. The right ventricular size is normal.  3. The mitral valve is normal in structure. No evidence of mitral valve regurgitation. No evidence of mitral stenosis.  4. The aortic valve has an indeterminant number of cusps. There is mild calcification of the aortic valve. Aortic valve regurgitation is not visualized. No aortic stenosis is present. Comparison(s): No  prior Echocardiogram. FINDINGS  Left Ventricle: Left ventricular ejection fraction, by estimation, is 60 to 65%. The left ventricle has normal function. Left ventricular endocardial border not optimally defined to evaluate regional wall motion. The left ventricular internal cavity size was normal in size. There is no left ventricular hypertrophy. Left ventricular diastolic parameters are consistent with Grade I diastolic dysfunction (impaired relaxation). Right Ventricle: The right ventricular size is normal. No increase in right ventricular wall thickness. Right ventricular systolic function is normal. Left Atrium: Left atrial size was normal in size. Right Atrium: Right atrial size was normal in size. Pericardium: There is no evidence of pericardial effusion. Mitral Valve: The mitral valve is normal in structure. No evidence of mitral valve regurgitation. No evidence of mitral valve stenosis. Tricuspid Valve: The tricuspid valve is grossly normal. Tricuspid valve regurgitation is not demonstrated. No evidence of tricuspid stenosis. Aortic Valve: The aortic valve has an indeterminant number of cusps. There is mild calcification of the aortic valve. Aortic valve regurgitation is not visualized. No aortic stenosis is present. Aortic valve peak gradient measures 12.2 mmHg. Pulmonic Valve: The pulmonic valve was grossly normal. Pulmonic valve regurgitation is not visualized. No evidence of pulmonic stenosis. Aorta: The aortic root and ascending aorta are structurally normal, with no evidence of dilitation. Venous: The inferior vena cava was not well visualized. IAS/Shunts: The interatrial septum was not assessed.  LEFT VENTRICLE PLAX 2D LVIDd:         4.00 cm   Diastology LVIDs:         2.60 cm   LV e' medial:    6.31 cm/s LV PW:         1.10 cm   LV E/e' medial:  11.0 LV IVS:        1.00 cm   LV e' lateral:   10.30 cm/s LVOT diam:     2.00 cm   LV E/e' lateral: 6.7 LV SV:         70 LV SV Index:   33 LVOT Area:      3.14 cm  RIGHT VENTRICLE RV S prime:     11.70 cm/s TAPSE (M-mode): 1.5 cm LEFT ATRIUM           Index        RIGHT ATRIUM           Index LA diam:      2.80 cm 1.34 cm/m   RA Area:     15.30 cm LA Vol (A4C): 25.7 ml 12.32 ml/m  RA Volume:  40.70 ml  19.51 ml/m  AORTIC VALVE AV Area (Vmax): 2.32 cm AV Vmax:        175.00 cm/s AV Peak Grad:   12.2 mmHg LVOT Vmax:      129.00 cm/s LVOT Vmean:     78.033 cm/s LVOT VTI:       0.222 m  AORTA Ao Root diam: 3.20 cm Ao Asc diam:  3.70 cm MITRAL VALVE               TRICUSPID VALVE MV Area (PHT): 2.83 cm    TR Peak grad:   9.5 mmHg MV Decel Time: 268 msec    TR Vmax:        154.00 cm/s MV E velocity: 69.40 cm/s MV A velocity: 81.00 cm/s  SHUNTS MV E/A ratio:  0.86        Systemic VTI:  0.22 m                            Systemic Diam: 2.00 cm Vishnu Priya Mallipeddi Electronically signed by Winfield Rast Mallipeddi Signature Date/Time: 10/23/2022/12:27:37 PM    Final      Assessment   [name] is a 67 y.o. year old male with history of HLD, HTN, tobacco abuse, alcohol abuse, and blindness who presented to the hospital with chest pain, cough, myalgias, and diarrhea and was subsequently diagnosed with AKI and COVID 19. GI consulted given complaint of odynophagia.   Odynophagia/dysphagia: Patient with some mild pill dysphagia today, no symptoms prior to this.  Has been experiencing burning with swallowing liquids, medications, and foods for about 1 week.  Etiology unclear at this time however suspect esophagitis and gastritis likely in the setting of COVID, chronic cough, and stress.  He denies any recent alcohol use or frequent NSAID use.  He has not been on any GI prophylaxis and denies any baseline GERD symptoms or history.  Reassuringly he does not have any nausea, vomiting, lack of appetite, or any recent weight loss.  Will start him on GI prophylaxis with PPI daily IV while inpatient and also give Carafate 3 times a day before meals and at bedtime.  If  his symptoms worsen or he develops overt dysphagia we could consider inpatient EGD however will likely pursue outpatient unless symptoms severe.  Plan / Recommendations   Carafate QID for 2 weeks IV PPI daily, transition to p.o on discharge If ongoing symptoms can plan for outpatient upper endoscopy.  Would only consider inpatient if he was not able to tolerate a diet at all     10/25/2022, 8:28 AM  Brooke Bonito, MSN, FNP-BC, AGACNP-BC [facility]

## 2022-10-25 NOTE — TOC Initial Note (Addendum)
Transition of Care [facility]) - Initial/Assessment Note    Patient Details  Name: [name] MRN: [id number] Date of Birth: Sep 05, 1955  [facility] CM/SW Contact:    Villa Herb, LCSWA Phone Number: 10/25/2022, 9:58 AM  Clinical Narrative:                 TOC updated that PT is recommending SNF for pt at D/C. CSW spoke with pt who states he is agreeable to SNF referral and prefers [facility] for SNF. CSW explained that TOC will have to check on [facility] policy for COVID. CSW spoke to [place] with [facility] who states that they do not have a private room which pt would have to go to due to COVID status. TOC to follow.   Addendum 10:40am: CSW updated that [facility] is unable to offer a bed at this time. CSW updated pt of this, he is agreeable to referral being sent out further for review. CSW sent out. TOC to follow up with pt on bed offers. TOC to follow.   Expected Discharge Plan: Skilled Nursing Facility Barriers to Discharge: Continued Medical Work up   Patient Goals and CMS Choice Patient states their goals for this hospitalization and ongoing recovery are:: go to SNF CMS Medicare.gov Compare Post Acute Care list provided to:: Patient Choice offered to / list presented to : Patient [place] ownership interest in [facility].provided to:: Patient    Expected Discharge Plan and Services In-house Referral: Clinical Social Work Discharge Planning Services: CM Consult Post Acute Care Choice: Skilled Nursing Facility Living arrangements for the past 2 months: Single Family Home                                      Prior Living Arrangements/Services Living arrangements for the past 2 months: Single Family Home Lives with:: Self Patient language and need for interpreter reviewed:: Yes Do you feel safe going back to the place where you live?: Yes      Need for Family Participation in Patient Care: Yes (Comment) Care giver support system in place?: Yes (comment)    Criminal Activity/Legal Involvement Pertinent to Current Situation/Hospitalization: No - Comment as needed  Activities of Daily Living Home Assistive Devices/Equipment: Cane (specify quad or straight) ADL Screening (condition at time of admission) Patient's cognitive ability adequate to safely complete daily activities?: Yes Is the patient deaf or have difficulty hearing?: No Does the patient have difficulty seeing, even when wearing glasses/contacts?: Yes Does the patient have difficulty concentrating, remembering, or making decisions?: No Patient able to express need for assistance with ADLs?: Yes Does the patient have difficulty dressing or bathing?: Yes Independently performs ADLs?: No Communication: Independent Dressing (OT): Independent, Independent with device (comment) Grooming: Independent Feeding: Independent Bathing: Independent with device (comment) Toileting: Independent with device (comment) In/Out Bed: Independent with device (comment) Walks in Home: Independent with device (comment) Does the patient have difficulty walking or climbing stairs?: Yes Weakness of Legs: Both Weakness of Arms/Hands: Both  Permission Sought/Granted                  Emotional Assessment Appearance:: Appears stated age Attitude/Demeanor/Rapport: Engaged Affect (typically observed): Accepting Orientation: : Oriented to Self, Oriented to Place, Oriented to  Time, Oriented to Situation Alcohol / Substance Use: Not Applicable Psych Involvement: No (comment)  Admission diagnosis:  Dehydration [E86.0] AKI (acute kidney injury) (HCC) [N17.9] COVID [U07.1]  Patient Active Problem List   Diagnosis Date Noted   Acute respiratory failure with hypoxia (HCC) 10/23/2022   AKI (acute kidney injury) (HCC) 10/22/2022   COVID-19 virus infection 10/22/2022   Dehydration 10/22/2022   Elevated troponin 10/22/2022   Colon cancer screening 05/25/2020   Alcohol abuse with intoxication (HCC)     Anemia due to vitamin B12 deficiency    Right leg numbness 05/07/2017   Alcohol abuse 05/07/2017   Hypokalemia 05/07/2017   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   OBESITY 01/08/2007   Hyperlipidemia 12/25/2005   TOBACCO ABUSE 12/25/2005   BLINDNESS, LEGAL, [place] DEFINITION 12/25/2005   Essential hypertension 12/25/2005   TRIGGER FINGER 12/25/2005   PCP:  Mirna Mires, MD Pharmacy:   [address] [facility] [address], [place] - [address] [place] [address] Phone: [phone] Fax: [phone]     Social Determinants of Health (SDOH) Social History: SDOH Screenings   Food Insecurity: Patient Declined (10/23/2022)  Housing: Low Risk  (10/23/2022)  Transportation Needs: No Transportation Needs (10/23/2022)  Utilities: Not At Risk (10/23/2022)  Stress: Stress Concern Present (10/23/2022)  Tobacco Use: High Risk (10/22/2022)   SDOH Interventions: Stress Interventions: Intervention Not Indicated   Readmission Risk Interventions    10/25/2022    9:57 AM  Readmission Risk Prevention Plan  Transportation Screening Complete  Home Care Screening Complete  Medication Review (RN CM) Complete

## 2022-10-25 NOTE — Progress Notes (Signed)
Patient ID: [name], male   DOB: Sep 29, 1955, 67 y.o.   MRN: [id number]  Subjective: he had 1.7 liters UOP Over 9/19 charted.  He has been on LR at 150 ml/hr. He feels better.  Still in ICU.    Review of systems: Breathing is better No n/v Some dizziness- better.    O:BP 112/64   Pulse (!) 50   Temp 98.2 F (36.8 C) (Axillary)   Resp 19   Ht 5\' 10"  (1.778 m)   Wt 90.6 kg   SpO2 100%   BMI 28.66 kg/m   Intake/Output Summary (Last 24 hours) at 10/25/2022 1229 Last data filed at 10/25/2022 1001 Gross per 24 hour  Intake 1781.74 ml  Output 1750 ml  Net 31.74 ml   Intake/Output: I/O last 3 completed shifts: In: 4424.3 [P.O.:440; I.V.:3881; IV Piggyback:103.3] Out: 2000 [Urine:2000]  Intake/Output this shift:  Total I/O In: 427.4 [I.V.:427.4] Out: 650 [Urine:650] Weight change:     Recent Labs  Lab 10/22/22 1735 10/23/22 0340 10/24/22 0534 10/25/22 0455  NA 132* 132* 136 138  K 3.3* 2.8* 3.0* 3.4*  CL 87* 97* 102 104  CO2 17* 17* 21* 23  GLUCOSE 112* 124* 152* 160*  BUN 73* 73* 76* 59*  CREATININE 8.37* 6.07* 3.25* 2.16*  ALBUMIN 5.0 3.6 3.4* 2.9*  CALCIUM 9.2 8.3* 9.0 8.7*  PHOS  --   --   --  2.3*  AST 56* 44* 34  --   ALT 47* 34 32  --    Liver Function Tests: Recent Labs  Lab 10/22/22 1735 10/23/22 0340 10/24/22 0534 10/25/22 0455  AST 56* 44* 34  --   ALT 47* 34 32  --   ALKPHOS 91 68 58  --   BILITOT 0.5 0.5 0.5  --   PROT 9.0* 6.6 6.3*  --   ALBUMIN 5.0 3.6 3.4* 2.9*   Recent Labs  Lab 10/22/22 1735  LIPASE 89*   No results for input(s): "AMMONIA" in the last 168 hours. CBC: Recent Labs  Lab 10/22/22 1735 10/23/22 0340 10/24/22 0534  WBC 12.2* 10.5 11.1*  NEUTROABS  --  9.4*  --   HGB 17.7* 15.5 14.3  HCT 53.4* 45.5 42.6  MCV 79.8* 78.4* 77.9*  PLT 546* 442* 461*   Cardiac Enzymes: No results for input(s): "CKTOTAL", "CKMB", "CKMBINDEX", "TROPONINI" in the last 168 hours. CBG: No results for input(s): "GLUCAP" in the  last 168 hours.  Iron Studies:  Recent Labs    10/25/22 0455  FERRITIN 188   Studies/Results: [place] EKG SITE RITE  Result Date: 10/24/2022 If Site Rite image not attached, placement could not be confirmed due to current cardiac rhythm.   arformoterol  15 mcg Nebulization BID   budesonide (PULMICORT) nebulizer solution  0.5 mg Nebulization BID   Chlorhexidine Gluconate Cloth  6 each Topical Q0600   heparin  5,000 Units Subcutaneous Q8H   influenza vaccine adjuvanted  0.5 mL Intramuscular Tomorrow-1000   ipratropium-albuterol  3 mL Nebulization BID   linezolid  600 mg Oral Q12H   methylPREDNISolone (SOLU-MEDROL) injection  60 mg Intravenous Q12H   potassium chloride  40 mEq Oral BID   sodium chloride flush  10-40 mL Intracatheter Q12H    BMET    Component Value Date/Time   NA 138 10/25/2022 0455   K 3.4 (L) 10/25/2022 0455   CL 104 10/25/2022 0455   CO2 23 10/25/2022 0455   GLUCOSE 160 (H) 10/25/2022 0455   BUN 59 (  H) 10/25/2022 0455   CREATININE 2.16 (H) 10/25/2022 0455   CALCIUM 8.7 (L) 10/25/2022 0455   GFRNONAA 33 (L) 10/25/2022 0455   GFRAA 50 (L) 03/28/2018 2005   CBC    Component Value Date/Time   WBC 11.1 (H) 10/24/2022 0534   RBC 5.47 10/24/2022 0534   HGB 14.3 10/24/2022 0534   HCT 42.6 10/24/2022 0534   PLT 461 (H) 10/24/2022 0534   MCV 77.9 (L) 10/24/2022 0534   MCH 26.1 10/24/2022 0534   MCHC 33.6 10/24/2022 0534   RDW 15.8 (H) 10/24/2022 0534   LYMPHSABS 0.7 10/23/2022 0340   MONOABS 0.3 10/23/2022 0340   EOSABS 0.0 10/23/2022 0340   BASOSABS 0.0 10/23/2022 0340    Assessment/Plan:  AKI/CKD stage IIIb - presumably ischemic ATN in setting of profound volume depletion, hypotension with concomitant ARB therapy.  BUN/Scr markedly improved with IVF's and continue to hold ARB and diuretics for now.  Continue with IVF's.  Unknown baseline as his last Scr was 1.58 in June 2022.  Will continue to follow UOP and daily Scr.  No uremic symptoms at this time.  No  indication for dialysis.  Preferred narcotic agents for pain control are hydromorphone, fentanyl, and methadone. Morphine should not be used.  Resolving AKI - continue hydration    Acute hypoxic respiratory failure - likely secondary to Covid-19 infection.  improving COVID + s/p remdesivir per primary svc Diarrhea -  Likely enteritis from COVID-19.  Workup per primary svc Hypokalemia - replete and follow.  Scheduled repletion is to end after 4 doses - agree then would reassess   HTN- holding meds for now due to hypotension.  Nephrology will sign off.  Patient will need follow-up with his PCP 1 week after discharge.  We will set up routine follow-up with nephrology at [facility] in 2 months  Estanislado Emms, MD 12:29 PM 10/25/2022

## 2022-10-25 NOTE — Plan of Care (Signed)
  Problem: Acute Rehab OT Goals (only OT should resolve) Goal: Pt. Will Perform Grooming Flowsheets (Taken 10/25/2022 0943) Pt Will Perform Grooming:  with modified independence  standing Goal: Pt. Will Perform Lower Body Bathing Flowsheets (Taken 10/25/2022 0943) Pt Will Perform Lower Body Bathing:  with modified independence  sitting/lateral leans  sit to/from stand Goal: Pt. Will Perform Lower Body Dressing Flowsheets (Taken 10/25/2022 0943) Pt Will Perform Lower Body Dressing:  with modified independence  sitting/lateral leans  sit to/from stand Goal: Pt. Will Transfer To Toilet Flowsheets (Taken 10/25/2022 [phone]) Pt Will Transfer to Toilet:  with modified independence  ambulating Goal: Pt/Caregiver Will Perform Home Exercise Program Flowsheets (Taken 10/25/2022 [phone]) Pt/caregiver will Perform Home Exercise Program:  Increased strength  Both right and left upper extremity  Independently  Nile Dorning OT, MOT

## 2022-10-25 NOTE — Evaluation (Signed)
Physical Therapy Evaluation Patient Details Name: [name] MRN: [id number] DOB: Dec 17, 1955 Today's Date: 10/25/2022  History of Present Illness  [name] is a 67 y.o. male with medical history significant of blindness, hyperlipidemia, hypertension, presents to the ED with a chief complaint of being very sick.  Patient reports that he has had a headache, chest pain, leg aches, diarrhea, and more.  He reports the symptoms started about 4 days ago.  He reports bowel and urine incontinence due to such frequent diarrhea.  He reports diarrhea was happening about every hour.  There was no melena, no hematochezia.  He reports the consistency was like water.  He has been nauseous but had no vomiting.  Patient reports he has not had any p.o. intake in last 4 days.  He has had some shortness of breath and a cough.  The cough is dry.  Patient reports that his leg aches are like cramping.  His headache feels like a pressure.  His chest pain is worse with coughing and deep inspiration.  He had 1 sick contact but is not sure if that person had COVID.  Patient has been vaccinated for COVID and had boosters up until this year.  He was planning to get his next booster October 1.  Patient cannot tell if he feels better after treatment in the ED or not since he has just been trying to sleep.  Patient is a current smoker but declines nicotine patch at this time.  He very seldomly drinks.  Patient is full code.  Review of Systems: As mentioned in the history of present illness. All other systems reviewed and are negative.   Clinical Impression  Patient demonstrates labored movement for sitting up at bedside, once seated c/o chest pain, discomfort in throat that took 3-4 minutes to resolved.  Patient limited to a few slow labored steps forward/backward at bedside due to c/o fatigue/generalized weakness and tolerated sitting in chair after therapy.  Patient will benefit from continued skilled physical therapy in  hospital and recommended venue below to increase strength, balance, endurance for safe ADLs and gait.         If plan is discharge home, recommend the following: Help with stairs or ramp for entrance;Assistance with cooking/housework;A lot of help with walking and/or transfers;A lot of help with bathing/dressing/bathroom   Can travel by private vehicle   Yes    Equipment Recommendations None recommended by PT  Recommendations for Other Services       Functional Status Assessment Patient has had a recent decline in their functional status and demonstrates the ability to make significant improvements in function in a reasonable and predictable amount of time.     Precautions / Restrictions Precautions Precautions: Fall Restrictions Weight Bearing Restrictions: No      Mobility  Bed Mobility Overal bed mobility: Modified Independent                  Transfers Overall transfer level: Needs assistance Equipment used: 1 person hand held assist Transfers: Sit to/from Stand, Bed to chair/wheelchair/BSC Sit to Stand: Contact guard assist   Step pivot transfers: Contact guard assist, Min assist       General transfer comment: unsteady labored movement    Ambulation/Gait Ambulation/Gait assistance: Min assist, Mod assist Gait Distance (Feet): 6 Feet Assistive device: 1 person hand held assist Gait Pattern/deviations: Decreased step length - right, Decreased step length - left, Decreased stride length Gait velocity: slow     General Gait  Details: limited to a few slow labored unsteady steps at bedside before having to sit due to fatigue and poor standing balance  Stairs            Wheelchair Mobility     Tilt Bed    Modified Rankin (Stroke Patients Only)       Balance Overall balance assessment: Needs assistance Sitting-balance support: Feet supported, No upper extremity supported Sitting balance-Leahy Scale: Good Sitting balance - Comments: seated  at EOB   Standing balance support: No upper extremity supported, During functional activity Standing balance-Leahy Scale: Poor Standing balance comment: fair/poor with hand held assist                             Pertinent Vitals/Pain Pain Assessment Pain Assessment: Faces Faces Pain Scale: Hurts little more Pain Location: throat, chest, stomach, head Pain Descriptors / Indicators: Discomfort Pain Intervention(s): Limited activity within patient's tolerance, Monitored during session, Repositioned    Home Living Family/patient expects to be discharged to:: Private residence Living Arrangements: Alone Available Help at Discharge: Other (Comment) Type of Home: House Home Access: Ramped entrance       Home Layout: One level Home Equipment: Cane - single point;Grab bars - toilet;Shower seat Additional Comments: Pt pays someone to take him to the store.    Prior Function Prior Level of Function : Independent/Modified Independent;History of Falls (last six months)             Mobility Comments: Ambulates household and short community distances with use of cane for vision deficits. ADLs Comments: Independent ADL; assist for transportation to get food.     Extremity/Trunk Assessment   Upper Extremity Assessment Upper Extremity Assessment: Defer to OT evaluation    Lower Extremity Assessment Lower Extremity Assessment: Generalized weakness    Cervical / Trunk Assessment Cervical / Trunk Assessment: Normal  Communication   Communication Communication: No apparent difficulties  Cognition Arousal: Alert Behavior During Therapy: WFL for tasks assessed/performed Overall Cognitive Status: Within Functional Limits for tasks assessed                                          General Comments      Exercises     Assessment/Plan    PT Assessment Patient needs continued PT services  PT Problem List Decreased strength;Decreased activity  tolerance;Decreased balance;Decreased mobility       PT Treatment Interventions DME instruction;Gait training;Patient/family education;Stair training;Functional mobility training;Therapeutic activities;Therapeutic exercise;Balance training    PT Goals (Current goals can be found in the Care Plan section)  Acute Rehab PT Goals Patient Stated Goal: return home after rehab PT Goal Formulation: With patient Time For Goal Achievement: 11/08/22 Potential to Achieve Goals: Good    Frequency Min 3X/week     Co-evaluation PT/OT/SLP Co-Evaluation/Treatment: Yes Reason for Co-Treatment: To address functional/ADL transfers PT goals addressed during session: Mobility/safety with mobility;Balance;Proper use of DME OT goals addressed during session: ADL's and self-care       AM-PAC PT "6 Clicks" Mobility  Outcome Measure Help needed turning from your back to your side while in a flat bed without using bedrails?: None Help needed moving from lying on your back to sitting on the side of a flat bed without using bedrails?: A Little Help needed moving to and from a bed to a chair (including a wheelchair)?: A  Little Help needed standing up from a chair using your arms (e.g., wheelchair or bedside chair)?: A Little Help needed to walk in hospital room?: A Lot Help needed climbing 3-5 steps with a railing? : A Lot 6 Click Score: 17    End of Session   Activity Tolerance: Patient tolerated treatment well;Patient limited by fatigue Patient left: in chair;with call bell/phone within reach Nurse Communication: Mobility status PT Visit Diagnosis: Unsteadiness on feet (R26.81);Other abnormalities of gait and mobility (R26.89);Muscle weakness (generalized) (M62.81)    Time: 1610-9604 PT Time Calculation (min) (ACUTE ONLY): 27 min   Charges:   PT Evaluation $PT Eval Moderate Complexity: 1 Mod PT Treatments $Therapeutic Activity: 23-37 mins PT General Charges $$ ACUTE PT VISIT: 1 Visit          12:19 PM, 10/25/22 Ocie Bob, MPT Physical Therapist with [facility] [phone] office [phone] mobile phone

## 2022-10-25 NOTE — [place] FL2 (Signed)
[place] MEDICAID FL2 LEVEL OF CARE FORM     IDENTIFICATION  Patient Name: [name] Birthdate: [birth year] Sex: male Admission Date (Current Location): 10/22/2022  [facility] and [place] Number:  [employer] and Address:  [facility],  [address] [name] [address]      Provider Number: [id number]  Attending Physician Name and Address:  Catarina Hartshorn, MD  Relative Name and Phone Number:       Current Level of Care: Hospital Recommended Level of Care: Skilled Nursing Facility Prior Approval Number:    Date Approved/Denied:   PASRR Number: [id number]  Discharge Plan: SNF    Current Diagnoses: Patient Active Problem List   Diagnosis Date Noted   Acute respiratory failure with hypoxia (HCC) 10/23/2022   AKI (acute kidney injury) (HCC) 10/22/2022   COVID-19 virus infection 10/22/2022   Dehydration 10/22/2022   Elevated troponin 10/22/2022   Colon cancer screening 05/25/2020   Alcohol abuse with intoxication (HCC)    Anemia due to vitamin B12 deficiency    Right leg numbness 05/07/2017   Alcohol abuse 05/07/2017   Hypokalemia 05/07/2017   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   OBESITY 01/08/2007   Hyperlipidemia 12/25/2005   TOBACCO ABUSE 12/25/2005   BLINDNESS, LEGAL, [place] DEFINITION 12/25/2005   Essential hypertension 12/25/2005   TRIGGER FINGER 12/25/2005    Orientation RESPIRATION BLADDER Height & Weight     Self, Time, Situation, Place  Normal Continent Weight: 199 lb 11.8 oz (90.6 kg) Height:  5\' 10"  (177.8 cm)  BEHAVIORAL SYMPTOMS/MOOD NEUROLOGICAL BOWEL NUTRITION STATUS      Continent Diet (see dc summary)  AMBULATORY STATUS COMMUNICATION OF NEEDS Skin   Extensive Assist Verbally Normal                       Personal Care Assistance Level of Assistance  Bathing, Feeding, Dressing Bathing Assistance: Limited assistance Feeding assistance: Independent Dressing Assistance: Limited  assistance     Functional Limitations Info  Sight, Hearing, Speech Sight Info: Adequate Hearing Info: Adequate Speech Info: Adequate    SPECIAL CARE FACTORS FREQUENCY  PT (By licensed PT), OT (By licensed OT)     PT Frequency: 5x week OT Frequency: 5x week            Contractures Contractures Info: Not present    Additional Factors Info  Code Status, Allergies Code Status Info: Full Allergies Info: Cortisone           Current Medications (10/25/2022):  This is the current hospital active medication list Current Facility-Administered Medications  Medication Dose Route Frequency Provider Last Rate Last Admin   0.9 %  sodium chloride infusion  250 mL Intravenous Continuous Tat, Onalee Hua, MD       acetaminophen (TYLENOL) tablet 650 mg  650 mg Oral Q6H PRN Zierle-Ghosh, Asia B, DO       Or   acetaminophen (TYLENOL) suppository 650 mg  650 mg Rectal Q6H PRN Zierle-Ghosh, Asia B, DO       arformoterol (BROVANA) nebulizer solution 15 mcg  15 mcg Nebulization BID Tat, David, MD   15 mcg at 10/24/22 0906   budesonide (PULMICORT) nebulizer solution 0.5 mg  0.5 mg Nebulization BID Tat, David, MD   0.5 mg at 10/24/22 0904   ceFEPIme (MAXIPIME) 2 g in sodium chloride 0.9 % 100 mL IVPB  2 g Intravenous Q24H Catarina Hartshorn, MD 200 mL/hr at 10/25/22 5621 Infusion Verify at 10/25/22 [phone]  Chlorhexidine Gluconate Cloth 2 % PADS 6 each  6 each Topical Q0600 Tat, David, MD   6 each at 10/24/22 0524   heparin injection 5,000 Units  5,000 Units Subcutaneous Q8H Zierle-Ghosh, Asia B, DO   5,000 Units at 10/25/22 4098   influenza vaccine adjuvanted (FLUAD) injection 0.5 mL  0.5 mL Intramuscular Tomorrow-1000 Tat, Onalee Hua, MD       ipratropium-albuterol (DUONEB) 0.5-2.5 (3) MG/3ML nebulizer solution 3 mL  3 mL Nebulization BID Tat, David, MD   3 mL at 10/24/22 0902   lactated ringers infusion   Intravenous Continuous Zierle-Ghosh, Asia B, DO 150 mL/hr at 10/25/22 1191 Infusion Verify at 10/25/22 4782    linezolid (ZYVOX) tablet 600 mg  600 mg Oral Pablo Ledger, MD   600 mg at 10/24/22 2213   methylPREDNISolone sodium succinate (SOLU-MEDROL) 125 mg/2 mL injection 60 mg  60 mg Intravenous Q12H TatOnalee Hua, MD       norepinephrine (LEVOPHED) 4mg  in (0.016 mg/mL) premix infusion  2-10 mcg/min Intravenous Titrated Tat, Onalee Hua, MD 7.5 mL/hr at 10/25/22 9562 2 mcg/min at 10/25/22 0712   ondansetron (ZOFRAN) tablet 4 mg  4 mg Oral Q6H PRN Zierle-Ghosh, Asia B, DO       Or   ondansetron (ZOFRAN) injection 4 mg  4 mg Intravenous Q6H PRN Zierle-Ghosh, Asia B, DO       Oral care mouth rinse  15 mL Mouth Rinse PRN Tat, Onalee Hua, MD       oxyCODONE (Oxy IR/ROXICODONE) immediate release tablet 5 mg  5 mg Oral Q4H PRN Zierle-Ghosh, Asia B, DO   5 mg at 10/23/22 1940   phenol (CHLORASEPTIC) mouth spray 1 spray  1 spray Mouth/Throat PRN Tat, Onalee Hua, MD   1 spray at 10/23/22 1711   potassium chloride SA (KLOR-CON M) CR tablet 40 mEq  40 mEq Oral BID Terrial Rhodes, MD   40 mEq at 10/24/22 2213   sodium chloride flush (NS) 0.9 % injection 10-40 mL  10-40 mL Intracatheter Pablo Ledger, MD   10 mL at 10/24/22 2214   sodium chloride flush (NS) 0.9 % injection 10-40 mL  10-40 mL Intracatheter PRN Catarina Hartshorn, MD         Discharge Medications: Please see discharge summary for a list of discharge medications.  Relevant Imaging Results:  Relevant Lab Results:   Additional Information SSN: [id number], [place]

## 2022-10-26 ENCOUNTER — Telehealth: Payer: Self-pay | Admitting: Internal Medicine

## 2022-10-26 DIAGNOSIS — U071 COVID-19: Secondary | ICD-10-CM | POA: Diagnosis not present

## 2022-10-26 DIAGNOSIS — R1312 Dysphagia, oropharyngeal phase: Secondary | ICD-10-CM

## 2022-10-26 DIAGNOSIS — N179 Acute kidney failure, unspecified: Secondary | ICD-10-CM | POA: Diagnosis not present

## 2022-10-26 DIAGNOSIS — R1310 Dysphagia, unspecified: Secondary | ICD-10-CM

## 2022-10-26 DIAGNOSIS — J9601 Acute respiratory failure with hypoxia: Secondary | ICD-10-CM | POA: Diagnosis not present

## 2022-10-26 LAB — RENAL FUNCTION PANEL
Albumin: 2.8 g/dL — ABNORMAL LOW (ref 3.5–5.0)
Anion gap: 10 (ref 5–15)
BUN: 51 mg/dL — ABNORMAL HIGH (ref 8–23)
CO2: 24 mmol/L (ref 22–32)
Calcium: 8.6 mg/dL — ABNORMAL LOW (ref 8.9–10.3)
Chloride: 106 mmol/L (ref 98–111)
Creatinine, Ser: 1.79 mg/dL — ABNORMAL HIGH (ref 0.61–1.24)
GFR, Estimated: 41 mL/min — ABNORMAL LOW (ref >60)
Glucose, Bld: 131 mg/dL — ABNORMAL HIGH (ref 70–99)
Phosphorus: 2.3 mg/dL — ABNORMAL LOW (ref 2.5–4.6)
Potassium: 3.8 mmol/L (ref 3.5–5.1)
Sodium: 140 mmol/L (ref 135–145)

## 2022-10-26 LAB — CBC
HCT: 36.9 % — ABNORMAL LOW (ref 39.0–52.0)
Hemoglobin: 12.4 g/dL — ABNORMAL LOW (ref 13.0–17.0)
MCH: 27.1 pg (ref 26.0–34.0)
MCHC: 33.6 g/dL (ref 30.0–36.0)
MCV: 80.6 fL (ref 80.0–100.0)
Platelets: 372 10*3/uL (ref 150–400)
RBC: 4.58 MIL/uL (ref 4.22–5.81)
RDW: 16.7 % — ABNORMAL HIGH (ref 11.5–15.5)
WBC: 11.5 10*3/uL — ABNORMAL HIGH (ref 4.0–10.5)
nRBC: 0 % (ref 0.0–0.2)

## 2022-10-26 LAB — FERRITIN: Ferritin: 222 ng/mL (ref 24–336)

## 2022-10-26 LAB — C-REACTIVE PROTEIN: CRP: 0.6 mg/dL (ref <1.0)

## 2022-10-26 MED ORDER — POTASSIUM CHLORIDE CRYS ER 20 MEQ PO TBCR
40.0000 meq | EXTENDED_RELEASE_TABLET | Freq: Once | ORAL | Status: AC
  Administered 2022-10-26: 40 meq via ORAL
  Filled 2022-10-26: qty 2

## 2022-10-26 MED ORDER — POTASSIUM CHLORIDE CRYS ER 20 MEQ PO TBCR: 20.0000 meq | EXTENDED_RELEASE_TABLET | Freq: Once | ORAL | Status: DC

## 2022-10-26 NOTE — TOC Progression Note (Addendum)
Transition of Care [facility]) - Progression Note    Patient Details  Name: [name] MRN: [id number] Date of Birth: 03-30-1955  Transition of [facility] CM/SW Contact  Princella Ion, LCSW Phone Number: 10/26/2022, 8:33 AM  Clinical Narrative:    CSW followed up with Debbie at [facility] who reports it will be Monday before pt can discharge to a private room there.   Addend @ 3:23 PM Auth approved. Berkley Harvey ID#: [id number]   Expected Discharge Plan: Skilled Nursing Facility Barriers to Discharge: Continued Medical Work up  Expected Discharge Plan and Services In-house Referral: Clinical Social Work Discharge Planning Services: CM Consult Post Acute Care Choice: Skilled Nursing Facility Living arrangements for the past 2 months: Single Family Home                                       Social Determinants of Health (SDOH) Interventions SDOH Screenings   Food Insecurity: Patient Declined (10/23/2022)  Housing: Low Risk  (10/23/2022)  Transportation Needs: No Transportation Needs (10/23/2022)  Utilities: Not At Risk (10/23/2022)  Stress: Stress Concern Present (10/23/2022)  Tobacco Use: High Risk (10/22/2022)    Readmission Risk Interventions    10/25/2022    9:57 AM  Readmission Risk Prevention Plan  Transportation Screening Complete  Home Care Screening Complete  Medication Review (RN CM) Complete

## 2022-10-26 NOTE — Progress Notes (Signed)
SITE RITE  Result Date: 10/24/2022 If Site Rite image not attached, placement could not be confirmed due to current cardiac rhythm.  ECHOCARDIOGRAM COMPLETE  Result Date: 10/23/2022    ECHOCARDIOGRAM REPORT   Patient Name:   [name] Date of Exam: 10/23/2022 Medical Rec #:  [id number]        Height:       70.0 in Accession #:    [id number]       Weight:       199.7 lb Date of Birth:  10/28/1955       BSA:          2.086 m Patient Age:    66 years         BP:           88/49 mmHg Patient Gender: M                HR:           67 bpm. Exam Location:  Inpatient Procedure: 2D Echo, Cardiac Doppler and Color Doppler Indications:    Chest Pain R07.9  History:        Patient has no prior history of Echocardiogram examinations.                 Risk Factors:Hypertension, Dyslipidemia and Current Smoker.  Sonographer:    Lucendia Herrlich RCS Referring Phys: [phone] Deaysia Grigoryan  Sonographer Comments: Image acquisition challenging due to uncooperative patient. IMPRESSIONS  1. Left ventricular ejection fraction, by estimation, is 60 to 65%. The left ventricle has normal function. Left ventricular endocardial border not optimally defined to evaluate regional wall motion. Left ventricular diastolic parameters are consistent with Grade I diastolic dysfunction (impaired relaxation).  2. Right ventricular systolic function is normal. The right ventricular size is normal.  3. The mitral valve is normal in structure. No evidence of mitral valve regurgitation. No evidence of mitral stenosis.  4. The aortic valve has an indeterminant number of cusps. There is mild  calcification of the aortic valve. Aortic valve regurgitation is not visualized. No aortic stenosis is present. Comparison(s): No prior Echocardiogram. FINDINGS  Left Ventricle: Left ventricular ejection fraction, by estimation, is 60 to 65%. The left ventricle has normal function. Left ventricular endocardial border not optimally defined to evaluate regional wall motion. The left ventricular internal cavity size was normal in size. There is no left ventricular hypertrophy. Left ventricular diastolic parameters are consistent with Grade I diastolic dysfunction (impaired relaxation). Right Ventricle: The right ventricular size is normal. No increase in right ventricular wall thickness. Right ventricular systolic function is normal. Left Atrium: Left atrial size was normal in size. Right Atrium: Right atrial size was normal in size. Pericardium: There is no evidence of pericardial effusion. Mitral Valve: The mitral valve is normal in structure. No evidence of mitral valve regurgitation. No evidence of mitral valve stenosis. Tricuspid Valve: The tricuspid valve is grossly normal. Tricuspid valve regurgitation is not demonstrated. No evidence of tricuspid stenosis. Aortic Valve: The aortic valve has an indeterminant number of cusps. There is mild calcification of the aortic valve. Aortic valve regurgitation is not visualized. No aortic stenosis is present. Aortic valve peak gradient measures 12.2 mmHg. Pulmonic Valve: The pulmonic valve was grossly normal. Pulmonic valve regurgitation is not visualized. No evidence of pulmonic stenosis. Aorta: The aortic root and ascending aorta are structurally normal, with no evidence of dilitation. Venous: The inferior vena cava was not well visualized. IAS/Shunts: The interatrial septum was not assessed.  SITE RITE  Result Date: 10/24/2022 If Site Rite image not attached, placement could not be confirmed due to current cardiac rhythm.  ECHOCARDIOGRAM COMPLETE  Result Date: 10/23/2022    ECHOCARDIOGRAM REPORT   Patient Name:   [name] Date of Exam: 10/23/2022 Medical Rec #:  [id number]        Height:       70.0 in Accession #:    [id number]       Weight:       199.7 lb Date of Birth:  10/28/1955       BSA:          2.086 m Patient Age:    66 years         BP:           88/49 mmHg Patient Gender: M                HR:           67 bpm. Exam Location:  Inpatient Procedure: 2D Echo, Cardiac Doppler and Color Doppler Indications:    Chest Pain R07.9  History:        Patient has no prior history of Echocardiogram examinations.                 Risk Factors:Hypertension, Dyslipidemia and Current Smoker.  Sonographer:    Lucendia Herrlich RCS Referring Phys: [phone] Deaysia Grigoryan  Sonographer Comments: Image acquisition challenging due to uncooperative patient. IMPRESSIONS  1. Left ventricular ejection fraction, by estimation, is 60 to 65%. The left ventricle has normal function. Left ventricular endocardial border not optimally defined to evaluate regional wall motion. Left ventricular diastolic parameters are consistent with Grade I diastolic dysfunction (impaired relaxation).  2. Right ventricular systolic function is normal. The right ventricular size is normal.  3. The mitral valve is normal in structure. No evidence of mitral valve regurgitation. No evidence of mitral stenosis.  4. The aortic valve has an indeterminant number of cusps. There is mild  calcification of the aortic valve. Aortic valve regurgitation is not visualized. No aortic stenosis is present. Comparison(s): No prior Echocardiogram. FINDINGS  Left Ventricle: Left ventricular ejection fraction, by estimation, is 60 to 65%. The left ventricle has normal function. Left ventricular endocardial border not optimally defined to evaluate regional wall motion. The left ventricular internal cavity size was normal in size. There is no left ventricular hypertrophy. Left ventricular diastolic parameters are consistent with Grade I diastolic dysfunction (impaired relaxation). Right Ventricle: The right ventricular size is normal. No increase in right ventricular wall thickness. Right ventricular systolic function is normal. Left Atrium: Left atrial size was normal in size. Right Atrium: Right atrial size was normal in size. Pericardium: There is no evidence of pericardial effusion. Mitral Valve: The mitral valve is normal in structure. No evidence of mitral valve regurgitation. No evidence of mitral valve stenosis. Tricuspid Valve: The tricuspid valve is grossly normal. Tricuspid valve regurgitation is not demonstrated. No evidence of tricuspid stenosis. Aortic Valve: The aortic valve has an indeterminant number of cusps. There is mild calcification of the aortic valve. Aortic valve regurgitation is not visualized. No aortic stenosis is present. Aortic valve peak gradient measures 12.2 mmHg. Pulmonic Valve: The pulmonic valve was grossly normal. Pulmonic valve regurgitation is not visualized. No evidence of pulmonic stenosis. Aorta: The aortic root and ascending aorta are structurally normal, with no evidence of dilitation. Venous: The inferior vena cava was not well visualized. IAS/Shunts: The interatrial septum was not assessed.  PROGRESS NOTE  MITCH SHILLINGTON WUJ:[id number] DOB: 04-18-1955 DOA: 10/22/2022 PCP: Mirna Mires, MD  Brief History:  67 year old male with a history of blindness, hypertension, hyperlipidemia, tobacco abuse, alcohol abuse presenting with 4-day history of chest pain, coughing, myalgias, and loose stools.  The patient had some nausea without emesis.  He had some exertional dyspnea.  He states that the chest pain is worsened with coughing or deep breaths.  He denies any fever, chills, headache, neck pain, abdominal pain, hematochezia, melena. In the ED, the patient had a low-grade temperature 9 9.0 F.  He was hemodynamically stable.  Oxygen saturation was 89% on room air.  The patient was placed on 3 L with saturation 93-95%.  WBC 12.2, hemoglobin 17.7, platelets 546.  Sodium 132, potassium 2.8, bicarbonate 17, serum creatinine 8.37. COVID-19 PCR was positive.  CT of the abdomen and pelvis showed bibasilar atelectasis.  There was scattered air-fluid levels throughout the distal small bowel and proximal colon consistent with enteritis.  There is no high-grade small bowel obstruction.  There is no ureteral calculus.  The patient was started on IV fluids and IV steroids.   Assessment/Plan: Acute respiratory failure with hypoxia due COVID-19 -Presented with some dyspnea and hypoxia -secondary to COVID-19 infection -Stable on 3 L nasal cannula>>weaned to RA -continue IV Solu-Medrol -CRP 4.3>>2.8 -Ferritin 503>>356>>188 -D-dimer 0.85>>0.48>>0.48 -received 3 days remdesivir   Hypotension -Initially--BPs trending down despite fluid resuscitation -UA--no pyuria -PCT 2.41>>0.79 -blood cultures x 2--neg to date -started linezolid/cefepime>> discontinue -lactic 1.9 -start levophed 74mcg>>>2mcg>>wean off am 9/20  Sinus bradycardia -Personally reviewed telemetry>> no high-grade AV block's -Mentation stable--patient was lucid -BP gradually improving--now off Levophed    Odynophagia -consult GI --appreciated -Continue soft diet -Odynophagia is improving   Diarrhea -Check C. Difficile--no BM -Stool pathogen panel-no BM -Likely secondary to COVID-19 -resolved   Acute kidney injury -Baseline creatinine 1.3-1.6 -Presented with serum creatinine 8.37>>3.25>>2.16>>1.79 -Secondary to volume depletion -Continue IV fluids -appreciate nephrology   Elevated troponin -Secondary to demand ischemia -Echo EF 60-65%, G1DD, normal RVF -Troponin trend is flat -Personally reviewed EKG--sinus rhythm, no ST-T wave change.   Essential hypertension -Holding amlodipine, ARB, HCTZ secondary to soft blood pressure   Hypokalemia -Replete -Check magnesium 2.7   Tobacco abuse -Tobacco cessation discussed                       Family Communication:  no Family at bedside   Consultants:  renal, GI   Code Status:  FULL   DVT Prophylaxis:  [place] Heparin      Procedures: As Listed in Progress Note Above   Antibiotics: Zyvox 9/19>>9/21 Cefepime 9/19>>9/21           Subjective: Patient denies fevers, chills, headache, chest pain, dyspnea, nausea, vomiting, diarrhea, abdominal pain, dysuria, hematuria, hematochezia, and melena.   Objective: Vitals:   10/26/22 1000 10/26/22 1100 10/26/22 1208 10/26/22 1213  BP: (!) 100/57 118/82  112/66  Pulse:    (!) 45  Resp: 17 19  14   Temp:   97.8 F (36.6 C)   TempSrc:   Oral   SpO2: 98% 94%  98%  Weight:      Height:        Intake/Output Summary (Last 24 hours) at 10/26/2022 1234 Last data filed at 10/26/2022 0818 Gross per 24 hour  Intake 3531.44 ml  Output 1 ml  Net 3530.44 ml   Weight change:  Exam:  SITE RITE  Result Date: 10/24/2022 If Site Rite image not attached, placement could not be confirmed due to current cardiac rhythm.  ECHOCARDIOGRAM COMPLETE  Result Date: 10/23/2022    ECHOCARDIOGRAM REPORT   Patient Name:   [name] Date of Exam: 10/23/2022 Medical Rec #:  [id number]        Height:       70.0 in Accession #:    [id number]       Weight:       199.7 lb Date of Birth:  10/28/1955       BSA:          2.086 m Patient Age:    66 years         BP:           88/49 mmHg Patient Gender: M                HR:           67 bpm. Exam Location:  Inpatient Procedure: 2D Echo, Cardiac Doppler and Color Doppler Indications:    Chest Pain R07.9  History:        Patient has no prior history of Echocardiogram examinations.                 Risk Factors:Hypertension, Dyslipidemia and Current Smoker.  Sonographer:    Lucendia Herrlich RCS Referring Phys: [phone] Deaysia Grigoryan  Sonographer Comments: Image acquisition challenging due to uncooperative patient. IMPRESSIONS  1. Left ventricular ejection fraction, by estimation, is 60 to 65%. The left ventricle has normal function. Left ventricular endocardial border not optimally defined to evaluate regional wall motion. Left ventricular diastolic parameters are consistent with Grade I diastolic dysfunction (impaired relaxation).  2. Right ventricular systolic function is normal. The right ventricular size is normal.  3. The mitral valve is normal in structure. No evidence of mitral valve regurgitation. No evidence of mitral stenosis.  4. The aortic valve has an indeterminant number of cusps. There is mild  calcification of the aortic valve. Aortic valve regurgitation is not visualized. No aortic stenosis is present. Comparison(s): No prior Echocardiogram. FINDINGS  Left Ventricle: Left ventricular ejection fraction, by estimation, is 60 to 65%. The left ventricle has normal function. Left ventricular endocardial border not optimally defined to evaluate regional wall motion. The left ventricular internal cavity size was normal in size. There is no left ventricular hypertrophy. Left ventricular diastolic parameters are consistent with Grade I diastolic dysfunction (impaired relaxation). Right Ventricle: The right ventricular size is normal. No increase in right ventricular wall thickness. Right ventricular systolic function is normal. Left Atrium: Left atrial size was normal in size. Right Atrium: Right atrial size was normal in size. Pericardium: There is no evidence of pericardial effusion. Mitral Valve: The mitral valve is normal in structure. No evidence of mitral valve regurgitation. No evidence of mitral valve stenosis. Tricuspid Valve: The tricuspid valve is grossly normal. Tricuspid valve regurgitation is not demonstrated. No evidence of tricuspid stenosis. Aortic Valve: The aortic valve has an indeterminant number of cusps. There is mild calcification of the aortic valve. Aortic valve regurgitation is not visualized. No aortic stenosis is present. Aortic valve peak gradient measures 12.2 mmHg. Pulmonic Valve: The pulmonic valve was grossly normal. Pulmonic valve regurgitation is not visualized. No evidence of pulmonic stenosis. Aorta: The aortic root and ascending aorta are structurally normal, with no evidence of dilitation. Venous: The inferior vena cava was not well visualized. IAS/Shunts: The interatrial septum was not assessed.  PROGRESS NOTE  MITCH SHILLINGTON WUJ:[id number] DOB: 04-18-1955 DOA: 10/22/2022 PCP: Mirna Mires, MD  Brief History:  67 year old male with a history of blindness, hypertension, hyperlipidemia, tobacco abuse, alcohol abuse presenting with 4-day history of chest pain, coughing, myalgias, and loose stools.  The patient had some nausea without emesis.  He had some exertional dyspnea.  He states that the chest pain is worsened with coughing or deep breaths.  He denies any fever, chills, headache, neck pain, abdominal pain, hematochezia, melena. In the ED, the patient had a low-grade temperature 9 9.0 F.  He was hemodynamically stable.  Oxygen saturation was 89% on room air.  The patient was placed on 3 L with saturation 93-95%.  WBC 12.2, hemoglobin 17.7, platelets 546.  Sodium 132, potassium 2.8, bicarbonate 17, serum creatinine 8.37. COVID-19 PCR was positive.  CT of the abdomen and pelvis showed bibasilar atelectasis.  There was scattered air-fluid levels throughout the distal small bowel and proximal colon consistent with enteritis.  There is no high-grade small bowel obstruction.  There is no ureteral calculus.  The patient was started on IV fluids and IV steroids.   Assessment/Plan: Acute respiratory failure with hypoxia due COVID-19 -Presented with some dyspnea and hypoxia -secondary to COVID-19 infection -Stable on 3 L nasal cannula>>weaned to RA -continue IV Solu-Medrol -CRP 4.3>>2.8 -Ferritin 503>>356>>188 -D-dimer 0.85>>0.48>>0.48 -received 3 days remdesivir   Hypotension -Initially--BPs trending down despite fluid resuscitation -UA--no pyuria -PCT 2.41>>0.79 -blood cultures x 2--neg to date -started linezolid/cefepime>> discontinue -lactic 1.9 -start levophed 74mcg>>>2mcg>>wean off am 9/20  Sinus bradycardia -Personally reviewed telemetry>> no high-grade AV block's -Mentation stable--patient was lucid -BP gradually improving--now off Levophed    Odynophagia -consult GI --appreciated -Continue soft diet -Odynophagia is improving   Diarrhea -Check C. Difficile--no BM -Stool pathogen panel-no BM -Likely secondary to COVID-19 -resolved   Acute kidney injury -Baseline creatinine 1.3-1.6 -Presented with serum creatinine 8.37>>3.25>>2.16>>1.79 -Secondary to volume depletion -Continue IV fluids -appreciate nephrology   Elevated troponin -Secondary to demand ischemia -Echo EF 60-65%, G1DD, normal RVF -Troponin trend is flat -Personally reviewed EKG--sinus rhythm, no ST-T wave change.   Essential hypertension -Holding amlodipine, ARB, HCTZ secondary to soft blood pressure   Hypokalemia -Replete -Check magnesium 2.7   Tobacco abuse -Tobacco cessation discussed                       Family Communication:  no Family at bedside   Consultants:  renal, GI   Code Status:  FULL   DVT Prophylaxis:  [place] Heparin      Procedures: As Listed in Progress Note Above   Antibiotics: Zyvox 9/19>>9/21 Cefepime 9/19>>9/21           Subjective: Patient denies fevers, chills, headache, chest pain, dyspnea, nausea, vomiting, diarrhea, abdominal pain, dysuria, hematuria, hematochezia, and melena.   Objective: Vitals:   10/26/22 1000 10/26/22 1100 10/26/22 1208 10/26/22 1213  BP: (!) 100/57 118/82  112/66  Pulse:    (!) 45  Resp: 17 19  14   Temp:   97.8 F (36.6 C)   TempSrc:   Oral   SpO2: 98% 94%  98%  Weight:      Height:        Intake/Output Summary (Last 24 hours) at 10/26/2022 1234 Last data filed at 10/26/2022 0818 Gross per 24 hour  Intake 3531.44 ml  Output 1 ml  Net 3530.44 ml   Weight change:  Exam:  SITE RITE  Result Date: 10/24/2022 If Site Rite image not attached, placement could not be confirmed due to current cardiac rhythm.  ECHOCARDIOGRAM COMPLETE  Result Date: 10/23/2022    ECHOCARDIOGRAM REPORT   Patient Name:   [name] Date of Exam: 10/23/2022 Medical Rec #:  [id number]        Height:       70.0 in Accession #:    [id number]       Weight:       199.7 lb Date of Birth:  10/28/1955       BSA:          2.086 m Patient Age:    66 years         BP:           88/49 mmHg Patient Gender: M                HR:           67 bpm. Exam Location:  Inpatient Procedure: 2D Echo, Cardiac Doppler and Color Doppler Indications:    Chest Pain R07.9  History:        Patient has no prior history of Echocardiogram examinations.                 Risk Factors:Hypertension, Dyslipidemia and Current Smoker.  Sonographer:    Lucendia Herrlich RCS Referring Phys: [phone] Deaysia Grigoryan  Sonographer Comments: Image acquisition challenging due to uncooperative patient. IMPRESSIONS  1. Left ventricular ejection fraction, by estimation, is 60 to 65%. The left ventricle has normal function. Left ventricular endocardial border not optimally defined to evaluate regional wall motion. Left ventricular diastolic parameters are consistent with Grade I diastolic dysfunction (impaired relaxation).  2. Right ventricular systolic function is normal. The right ventricular size is normal.  3. The mitral valve is normal in structure. No evidence of mitral valve regurgitation. No evidence of mitral stenosis.  4. The aortic valve has an indeterminant number of cusps. There is mild  calcification of the aortic valve. Aortic valve regurgitation is not visualized. No aortic stenosis is present. Comparison(s): No prior Echocardiogram. FINDINGS  Left Ventricle: Left ventricular ejection fraction, by estimation, is 60 to 65%. The left ventricle has normal function. Left ventricular endocardial border not optimally defined to evaluate regional wall motion. The left ventricular internal cavity size was normal in size. There is no left ventricular hypertrophy. Left ventricular diastolic parameters are consistent with Grade I diastolic dysfunction (impaired relaxation). Right Ventricle: The right ventricular size is normal. No increase in right ventricular wall thickness. Right ventricular systolic function is normal. Left Atrium: Left atrial size was normal in size. Right Atrium: Right atrial size was normal in size. Pericardium: There is no evidence of pericardial effusion. Mitral Valve: The mitral valve is normal in structure. No evidence of mitral valve regurgitation. No evidence of mitral valve stenosis. Tricuspid Valve: The tricuspid valve is grossly normal. Tricuspid valve regurgitation is not demonstrated. No evidence of tricuspid stenosis. Aortic Valve: The aortic valve has an indeterminant number of cusps. There is mild calcification of the aortic valve. Aortic valve regurgitation is not visualized. No aortic stenosis is present. Aortic valve peak gradient measures 12.2 mmHg. Pulmonic Valve: The pulmonic valve was grossly normal. Pulmonic valve regurgitation is not visualized. No evidence of pulmonic stenosis. Aorta: The aortic root and ascending aorta are structurally normal, with no evidence of dilitation. Venous: The inferior vena cava was not well visualized. IAS/Shunts: The interatrial septum was not assessed.

## 2022-10-26 NOTE — Plan of Care (Signed)

## 2022-10-26 NOTE — Telephone Encounter (Signed)
This patient needs hospital follow-up visit with Toni Amend, myself, or other app.  Thank you

## 2022-10-27 DIAGNOSIS — J9601 Acute respiratory failure with hypoxia: Secondary | ICD-10-CM | POA: Diagnosis not present

## 2022-10-27 DIAGNOSIS — U071 COVID-19: Secondary | ICD-10-CM | POA: Diagnosis not present

## 2022-10-27 DIAGNOSIS — R7989 Other specified abnormal findings of blood chemistry: Secondary | ICD-10-CM | POA: Diagnosis not present

## 2022-10-27 DIAGNOSIS — N179 Acute kidney failure, unspecified: Secondary | ICD-10-CM | POA: Diagnosis not present

## 2022-10-27 LAB — RENAL FUNCTION PANEL
Albumin: 2.6 g/dL — ABNORMAL LOW (ref 3.5–5.0)
Anion gap: 8 (ref 5–15)
BUN: 47 mg/dL — ABNORMAL HIGH (ref 8–23)
CO2: 25 mmol/L (ref 22–32)
Calcium: 8.4 mg/dL — ABNORMAL LOW (ref 8.9–10.3)
Chloride: 106 mmol/L (ref 98–111)
Creatinine, Ser: 1.67 mg/dL — ABNORMAL HIGH (ref 0.61–1.24)
GFR, Estimated: 45 mL/min — ABNORMAL LOW (ref >60)
Glucose, Bld: 129 mg/dL — ABNORMAL HIGH (ref 70–99)
Phosphorus: 2.7 mg/dL (ref 2.5–4.6)
Potassium: 3.8 mmol/L (ref 3.5–5.1)
Sodium: 139 mmol/L (ref 135–145)

## 2022-10-27 LAB — MAGNESIUM: Magnesium: 2 mg/dL (ref 1.7–2.4)

## 2022-10-27 MED ORDER — IPRATROPIUM-ALBUTEROL 0.5-2.5 (3) MG/3ML IN SOLN: 3.0000 mL | Freq: Four times a day (QID) | RESPIRATORY_TRACT | Status: DC | PRN

## 2022-10-27 MED ORDER — PREDNISONE 20 MG PO TABS
60.0000 mg | ORAL_TABLET | Freq: Every day | ORAL | Status: DC
  Administered 2022-10-28: 60 mg via ORAL
  Filled 2022-10-27: qty 3

## 2022-10-27 MED ORDER — MELATONIN 3 MG PO TABS
6.0000 mg | ORAL_TABLET | Freq: Once | ORAL | Status: AC
  Administered 2022-10-27: 6 mg via ORAL
  Filled 2022-10-27: qty 2

## 2022-10-27 MED ORDER — LACTATED RINGERS IV SOLN: INTRAVENOUS | Status: AC

## 2022-10-27 NOTE — Progress Notes (Signed)
Patient still Refusing nebs on room air , he states he will call when treatment needed.

## 2022-10-27 NOTE — Plan of Care (Signed)

## 2022-10-27 NOTE — Plan of Care (Signed)

## 2022-10-27 NOTE — Progress Notes (Signed)
PROGRESS NOTE  [name] VHQ:[id number] DOB: [birth year] DOA: 10/22/2022 PCP: Mirna Mires, MD  Brief History:  67 year old male with a history of blindness, hypertension, hyperlipidemia, tobacco abuse, alcohol abuse presenting with 4-day history of chest pain, coughing, myalgias, and loose stools.  The patient had some nausea without emesis.  He had some exertional dyspnea.  He states that the chest pain is worsened with coughing or deep breaths.  He denies any fever, chills, headache, neck pain, abdominal pain, hematochezia, melena. In the ED, the patient had a low-grade temperature 9 9.0 F.  He was hemodynamically stable.  Oxygen saturation was 89% on room air.  The patient was placed on 3 L with saturation 93-95%.  WBC 12.2, hemoglobin 17.7, platelets 546.  Sodium 132, potassium 2.8, bicarbonate 17, serum creatinine 8.37. COVID-19 PCR was positive.  CT of the abdomen and pelvis showed bibasilar atelectasis.  There was scattered air-fluid levels throughout the distal small bowel and proximal colon consistent with enteritis.  There is no high-grade small bowel obstruction.  There is no ureteral calculus.  The patient was started on IV fluids and IV steroids.   Assessment/Plan: Acute respiratory failure with hypoxia due COVID-19 -Presented with some dyspnea and hypoxia -secondary to COVID-19 infection -Stable on 3 L nasal cannula>>weaned to RA -continue IV Solu-Medrol>>prednisone -CRP 4.3>>2.8>>0.6 -Ferritin 503>>356>>188 -D-dimer 0.85>>0.48>>0.48 -received 3 days remdesivir   Hypotension -Initially--BPs trending down despite fluid resuscitation -UA--no pyuria -PCT 2.41>>0.79 -blood cultures x 2--neg to date -started linezolid/cefepime>> discontinue -lactic 1.9 -start levophed 61mcg>>>2mcg>>wean off am 9/20   Sinus bradycardia -Personally reviewed telemetry>> no high-grade AV block's -Mentation stable--patient was lucid -BP gradually improving--now off Levophed -BP  remains stable with SPB 110s -HR in mid to upper 40s   Odynophagia -consult GI --appreciated -Continue soft diet -Odynophagia is improving   Diarrhea -Check C. Difficile--no BM -Stool pathogen panel-no BM -Likely secondary to COVID-19 -resolved   Acute kidney injury -Baseline creatinine 1.3-1.6 -Presented with serum creatinine 8.37>>3.25>>2.16>>1.79>>1.67 -Secondary to volume depletion -Continue IV fluids -appreciate nephrology   Elevated troponin -Secondary to demand ischemia -Echo EF 60-65%, G1DD, normal RVF -Troponin trend is flat -Personally reviewed EKG--sinus rhythm, no ST-T wave change.   Essential hypertension -Holding amlodipine, ARB, HCTZ secondary to soft blood pressure   Hypokalemia/Hypophosphatemia -Replete -Check magnesium 2.7   Tobacco abuse -Tobacco cessation discussed                       Family Communication:  no Family at bedside   Consultants:  renal, GI   Code Status:  FULL   DVT Prophylaxis:  [place] Heparin      Procedures: As Listed in Progress Note Above   Antibiotics: Zyvox 9/19>>9/21 Cefepime 9/19>>9/21          Subjective: Patient denies fevers, chills, headache, chest pain, dyspnea, nausea, vomiting, diarrhea, abdominal pain, dysuria, hematuria, hematochezia, and melena.   Objective: Vitals:   10/27/22 0746 10/27/22 0810 10/27/22 0900 10/27/22 1130  BP:  (!) 121/58 (!) 113/57   Pulse:      Resp:  20 16   Temp: 98.5 F (36.9 C)   98.7 F (37.1 C)  TempSrc: Oral   Oral  SpO2:  99% 99%   Weight:      Height:        Intake/Output Summary (Last 24 hours) at 10/27/2022 1634 Last data filed at 10/27/2022 4132 Gross per 24 hour  Intake 2173.3 ml  Output 350 ml  Net 1823.3 ml   Weight change:  Exam:  General:  Pt is alert, follows commands appropriately, not in acute distress HEENT: No icterus, No thrush, No neck mass, [place]/AT Cardiovascular: RRR, S1/S2, no rubs, no gallops Respiratory: CTA bilaterally,  no wheezing, no crackles, no rhonchi Abdomen: Soft/+BS, non tender, non distended, no guarding Extremities: No edema, No lymphangitis, No petechiae, No rashes, no synovitis   Data Reviewed: I have personally reviewed following labs and imaging studies Basic Metabolic Panel: Recent Labs  Lab 10/23/22 0340 10/24/22 0534 10/25/22 0455 10/26/22 0358 10/27/22 0409  NA 132* 136 138 140 139  K 2.8* 3.0* 3.4* 3.8 3.8  CL 97* 102 104 106 106  CO2 17* 21* 23 24 25   GLUCOSE 124* 152* 160* 131* 129*  BUN 73* 76* 59* 51* 47*  CREATININE 6.07* 3.25* 2.16* 1.79* 1.67*  CALCIUM 8.3* 9.0 8.7* 8.6* 8.4*  MG 2.7*  --   --   --  2.0  PHOS  --   --  2.3* 2.3* 2.7   Liver Function Tests: Recent Labs  Lab 10/22/22 1735 10/23/22 0340 10/24/22 0534 10/25/22 0455 10/26/22 0358 10/27/22 0409  AST 56* 44* 34  --   --   --   ALT 47* 34 32  --   --   --   ALKPHOS 91 68 58  --   --   --   BILITOT 0.5 0.5 0.5  --   --   --   PROT 9.0* 6.6 6.3*  --   --   --   ALBUMIN 5.0 3.6 3.4* 2.9* 2.8* 2.6*   Recent Labs  Lab 10/22/22 1735  LIPASE 89*   No results for input(s): "AMMONIA" in the last 168 hours. Coagulation Profile: No results for input(s): "INR", "PROTIME" in the last 168 hours. CBC: Recent Labs  Lab 10/22/22 1735 10/23/22 0340 10/24/22 0534 10/26/22 0358  WBC 12.2* 10.5 11.1* 11.5*  NEUTROABS  --  9.4*  --   --   HGB 17.7* 15.5 14.3 12.4*  HCT 53.4* 45.5 42.6 36.9*  MCV 79.8* 78.4* 77.9* 80.6  PLT 546* 442* 461* 372   Cardiac Enzymes: No results for input(s): "CKTOTAL", "CKMB", "CKMBINDEX", "TROPONINI" in the last 168 hours. BNP: Invalid input(s): "POCBNP" CBG: No results for input(s): "GLUCAP" in the last 168 hours. HbA1C: No results for input(s): "HGBA1C" in the last 72 hours. Urine analysis:    Component Value Date/Time   COLORURINE STRAW (A) 10/24/2022 0500   APPEARANCEUR CLEAR 10/24/2022 0500   LABSPEC 1.016 10/24/2022 0500   PHURINE 5.0 10/24/2022 0500    GLUCOSEU NEGATIVE 10/24/2022 0500   HGBUR MODERATE (A) 10/24/2022 0500   BILIRUBINUR NEGATIVE 10/24/2022 0500   BILIRUBINUR negative 10/31/2019 1603   KETONESUR NEGATIVE 10/24/2022 0500   PROTEINUR 30 (A) 10/24/2022 0500   UROBILINOGEN 0.2 10/31/2019 1603   NITRITE NEGATIVE 10/24/2022 0500   LEUKOCYTESUR NEGATIVE 10/24/2022 0500   Sepsis Labs: @LABRCNTIP (procalcitonin:4,lacticidven:4) ) Recent Results (from the past 240 hour(s))  SARS Coronavirus 2 by RT PCR (hospital order, performed in [facility] hospital lab) *cepheid single result test* Anterior Nasal Swab     Status: Abnormal   Collection Time: 10/22/22  3:41 PM   Specimen: Anterior Nasal Swab  Result Value Ref Range Status   SARS Coronavirus 2 by RT PCR POSITIVE (A) NEGATIVE Final    Comment: (NOTE) SARS-CoV-2 target nucleic acids are DETECTED  SARS-CoV-2 RNA is generally detectable in upper respiratory specimens  during  the acute phase of infection.  Positive results are indicative  of the presence of the identified virus, but do not rule out bacterial infection or co-infection with other pathogens not detected by the test.  Clinical correlation with patient history and  other diagnostic information is necessary to determine patient infection status.  The expected result is negative.  Fact Sheet for Patients:   RoadLapTop.co.za   Fact Sheet for Healthcare Providers:   http://kim-miller.com/    This test is not yet approved or cleared by the [place] FDA and  has been authorized for detection and/or diagnosis of SARS-CoV-2 by FDA under an Emergency Use Authorization (EUA).  This EUA will remain in effect (meaning this test can be used) for the duration of  the COVID-19 declaration under Section 564(b)(1)  of the Act, 21 U.S.C. section 360-bbb-3(b)(1), unless the authorization is terminated or revoked sooner.   Performed at [facility], [address],  [place], [place] [address]   Culture, blood (Routine X 2) w Reflex to ID Panel     Status: None (Preliminary result)   Collection Time: 10/24/22 10:38 AM   Specimen: BLOOD LEFT HAND  Result Value Ref Range Status   Specimen Description BLOOD LEFT HAND  Final   Special Requests   Final    BOTTLES DRAWN AEROBIC AND ANAEROBIC Blood Culture adequate volume   Culture   Final    NO GROWTH 3 DAYS Performed at [facility], [address], [place], [place] [address]    Report Status PENDING  Incomplete  Culture, blood (Routine X 2) w Reflex to ID Panel     Status: None (Preliminary result)   Collection Time: 10/24/22 10:55 AM   Specimen: BLOOD RIGHT HAND  Result Value Ref Range Status   Specimen Description BLOOD RIGHT HAND  Final   Special Requests   Final    BOTTLES DRAWN AEROBIC AND ANAEROBIC Blood Culture adequate volume   Culture   Final    NO GROWTH 3 DAYS Performed at [facility], [address] [place], [place] [address]    Report Status PENDING  Incomplete  MRSA Next Gen by PCR, Nasal     Status: None   Collection Time: 10/24/22 11:49 AM   Specimen: Nasal Mucosa; Nasal Swab  Result Value Ref Range Status   MRSA by PCR Next Gen NOT DETECTED NOT DETECTED Final    Comment: (NOTE) The GeneXpert MRSA Assay (FDA approved for NASAL specimens only), is one component of a comprehensive MRSA colonization surveillance program. It is not intended to diagnose MRSA infection nor to guide or monitor treatment for MRSA infections. Test performance is not FDA approved in patients less than 72 years old. Performed at [facility], [address], [place], [place] [address]      Scheduled Meds:  Chlorhexidine Gluconate Cloth  6 each Topical Q0600   heparin  5,000 Units Subcutaneous Q8H   influenza vaccine adjuvanted  0.5 mL Intramuscular Tomorrow-1000   methylPREDNISolone (SOLU-MEDROL) injection  60 mg Intravenous Q12H   pantoprazole (PROTONIX) IV  40 mg Intravenous Daily   sodium chloride  flush  10-40 mL Intracatheter Q12H   sucralfate  1 g Oral TID WC & HS   Continuous Infusions:  sodium chloride     lactated ringers 150 mL/hr at 10/27/22 0932   norepinephrine (LEVOPHED) Adult infusion 2 mcg/min (10/27/22 0337)    Procedures/Studies: [place] EKG SITE RITE  Result Date: 10/24/2022 If Site Rite image not attached, placement could not be  confirmed due to current cardiac rhythm.  ECHOCARDIOGRAM COMPLETE  Result Date: 10/23/2022    ECHOCARDIOGRAM REPORT   Patient Name:   [name] Date of Exam: 10/23/2022 Medical Rec #:  [id number]        Height:       70.0 in Accession #:    [id number]       Weight:       199.7 lb Date of Birth:  1955-12-10       BSA:          2.086 m Patient Age:    66 years         BP:           88/49 mmHg Patient Gender: M                HR:           67 bpm. Exam Location:  Inpatient Procedure: 2D Echo, Cardiac Doppler and Color Doppler Indications:    Chest Pain R07.9  History:        Patient has no prior history of Echocardiogram examinations.                 Risk Factors:Hypertension, Dyslipidemia and Current Smoker.  Sonographer:    Lucendia Herrlich RCS Referring Phys: [phone] Alexyia Guarino  Sonographer Comments: Image acquisition challenging due to uncooperative patient. IMPRESSIONS  1. Left ventricular ejection fraction, by estimation, is 60 to 65%. The left ventricle has normal function. Left ventricular endocardial border not optimally defined to evaluate regional wall motion. Left ventricular diastolic parameters are consistent with Grade I diastolic dysfunction (impaired relaxation).  2. Right ventricular systolic function is normal. The right ventricular size is normal.  3. The mitral valve is normal in structure. No evidence of mitral valve regurgitation. No evidence of mitral stenosis.  4. The aortic valve has an indeterminant number of cusps. There is mild calcification of the aortic valve. Aortic valve regurgitation is not visualized. No aortic stenosis is  present. Comparison(s): No prior Echocardiogram. FINDINGS  Left Ventricle: Left ventricular ejection fraction, by estimation, is 60 to 65%. The left ventricle has normal function. Left ventricular endocardial border not optimally defined to evaluate regional wall motion. The left ventricular internal cavity size was normal in size. There is no left ventricular hypertrophy. Left ventricular diastolic parameters are consistent with Grade I diastolic dysfunction (impaired relaxation). Right Ventricle: The right ventricular size is normal. No increase in right ventricular wall thickness. Right ventricular systolic function is normal. Left Atrium: Left atrial size was normal in size. Right Atrium: Right atrial size was normal in size. Pericardium: There is no evidence of pericardial effusion. Mitral Valve: The mitral valve is normal in structure. No evidence of mitral valve regurgitation. No evidence of mitral valve stenosis. Tricuspid Valve: The tricuspid valve is grossly normal. Tricuspid valve regurgitation is not demonstrated. No evidence of tricuspid stenosis. Aortic Valve: The aortic valve has an indeterminant number of cusps. There is mild calcification of the aortic valve. Aortic valve regurgitation is not visualized. No aortic stenosis is present. Aortic valve peak gradient measures 12.2 mmHg. Pulmonic Valve: The pulmonic valve was grossly normal. Pulmonic valve regurgitation is not visualized. No evidence of pulmonic stenosis. Aorta: The aortic root and ascending aorta are structurally normal, with no evidence of dilitation. Venous: The inferior vena cava was not well visualized. IAS/Shunts: The interatrial septum was not assessed.  LEFT VENTRICLE PLAX 2D LVIDd:         4.00 cm  Diastology LVIDs:         2.60 cm   LV e' medial:    6.31 cm/s LV PW:         1.10 cm   LV E/e' medial:  11.0 LV IVS:        1.00 cm   LV e' lateral:   10.30 cm/s LVOT diam:     2.00 cm   LV E/e' lateral: 6.7 LV SV:         70 LV SV  Index:   33 LVOT Area:     3.14 cm  RIGHT VENTRICLE RV S prime:     11.70 cm/s TAPSE (M-mode): 1.5 cm LEFT ATRIUM           Index        RIGHT ATRIUM           Index LA diam:      2.80 cm 1.34 cm/m   RA Area:     15.30 cm LA Vol (A4C): 25.7 ml 12.32 ml/m  RA Volume:   40.70 ml  19.51 ml/m  AORTIC VALVE AV Area (Vmax): 2.32 cm AV Vmax:        175.00 cm/s AV Peak Grad:   12.2 mmHg LVOT Vmax:      129.00 cm/s LVOT Vmean:     78.033 cm/s LVOT VTI:       0.222 m  AORTA Ao Root diam: 3.20 cm Ao Asc diam:  3.70 cm MITRAL VALVE               TRICUSPID VALVE MV Area (PHT): 2.83 cm    TR Peak grad:   9.5 mmHg MV Decel Time: 268 msec    TR Vmax:        154.00 cm/s MV E velocity: 69.40 cm/s MV A velocity: 81.00 cm/s  SHUNTS MV E/A ratio:  0.86        Systemic VTI:  0.22 m                            Systemic Diam: 2.00 cm Vishnu Priya Mallipeddi Electronically signed by Winfield Rast Mallipeddi Signature Date/Time: 10/23/2022/12:27:37 PM    Final    CT ABDOMEN PELVIS WO CONTRAST  Result Date: 10/22/2022 CLINICAL DATA:  Epigastric pain, renal insufficiency EXAM: CT ABDOMEN AND PELVIS WITHOUT CONTRAST TECHNIQUE: Multidetector CT imaging of the abdomen and pelvis was performed following the standard protocol without IV contrast. RADIATION DOSE REDUCTION: This exam was performed according to the departmental dose-optimization program which includes automated exposure control, adjustment of the mA and/or kV according to patient size and/or use of iterative reconstruction technique. COMPARISON:  None Available. FINDINGS: Lower chest: Subsegmental atelectasis or scarring within the lingula. No acute pleural or parenchymal lung disease. Hepatobiliary: Unremarkable unenhanced appearance of the liver and gallbladder. Pancreas: Unremarkable unenhanced appearance. Spleen: Unremarkable unenhanced appearance. Adrenals/Urinary Tract: No urinary tract calculi or obstructive uropathy within either kidney. Exophytic 2.9 x 2.3 cm  hypodensity lower pole left kidney measures 16 HU, compatible with a cyst. No specific imaging follow-up is recommended. The adrenals and bladder are unremarkable. Stomach/Bowel: No evidence of high-grade bowel obstruction. Scattered gas fluid levels are seen throughout the jejunum, ileum, and proximal colon, which may reflect ileus or enteritis. Normal appendix right lower quadrant. No bowel wall thickening or inflammatory change. Vascular/Lymphatic: Aortic atherosclerosis. No enlarged abdominal or pelvic lymph nodes. Reproductive: Prostate is unremarkable. Other: No free fluid or free intraperitoneal  gas. No abdominal wall hernia. Musculoskeletal: No acute or destructive bony abnormalities. Chronic compression deformity superior endplate L1 and T11 vertebral bodies. Reconstructed images demonstrate no additional findings. IMPRESSION: 1. Scattered gas fluid levels throughout the distal small bowel and proximal colon, compatible with ileus or enteritis. No evidence of high-grade bowel obstruction. 2. No evidence of urinary tract calculi or obstructive uropathy. 3.  Aortic Atherosclerosis (ICD10-I70.0). Electronically Signed   By: Sharlet Salina M.D.   On: 10/22/2022 20:49   DG Chest 2 View  Result Date: 10/22/2022 CLINICAL DATA:  Chest pain. EXAM: CHEST - 2 VIEW COMPARISON:  10/31/2019. FINDINGS: Bilateral lung fields are clear. Bilateral costophrenic angles are clear. Normal cardio-mediastinal silhouette. No acute osseous abnormalities. The soft tissues are within normal limits. IMPRESSION: No active cardiopulmonary disease. Electronically Signed   By: Jules Schick M.D.   On: 10/22/2022 16:49    Catarina Hartshorn, DO  [facility]  If 7PM-7AM, please contact night-coverage [email or web address] Password [facility] 10/27/2022, 4:34 PM   LOS: 5 days

## 2022-10-28 DIAGNOSIS — K219 Gastro-esophageal reflux disease without esophagitis: Secondary | ICD-10-CM | POA: Diagnosis not present

## 2022-10-28 DIAGNOSIS — R279 Unspecified lack of coordination: Secondary | ICD-10-CM | POA: Diagnosis not present

## 2022-10-28 DIAGNOSIS — H548 Legal blindness, as defined in USA: Secondary | ICD-10-CM | POA: Diagnosis not present

## 2022-10-28 DIAGNOSIS — R001 Bradycardia, unspecified: Secondary | ICD-10-CM | POA: Diagnosis not present

## 2022-10-28 DIAGNOSIS — U071 COVID-19: Secondary | ICD-10-CM | POA: Diagnosis not present

## 2022-10-28 DIAGNOSIS — R7989 Other specified abnormal findings of blood chemistry: Secondary | ICD-10-CM | POA: Diagnosis not present

## 2022-10-28 DIAGNOSIS — I10 Essential (primary) hypertension: Secondary | ICD-10-CM | POA: Diagnosis not present

## 2022-10-28 DIAGNOSIS — J9601 Acute respiratory failure with hypoxia: Secondary | ICD-10-CM | POA: Diagnosis not present

## 2022-10-28 DIAGNOSIS — M6281 Muscle weakness (generalized): Secondary | ICD-10-CM | POA: Diagnosis not present

## 2022-10-28 DIAGNOSIS — N179 Acute kidney failure, unspecified: Secondary | ICD-10-CM | POA: Diagnosis not present

## 2022-10-28 DIAGNOSIS — E785 Hyperlipidemia, unspecified: Secondary | ICD-10-CM | POA: Diagnosis not present

## 2022-10-28 LAB — BASIC METABOLIC PANEL
Anion gap: 6 (ref 5–15)
BUN: 41 mg/dL — ABNORMAL HIGH (ref 8–23)
CO2: 25 mmol/L (ref 22–32)
Calcium: 8.4 mg/dL — ABNORMAL LOW (ref 8.9–10.3)
Chloride: 108 mmol/L (ref 98–111)
Creatinine, Ser: 1.41 mg/dL — ABNORMAL HIGH (ref 0.61–1.24)
GFR, Estimated: 55 mL/min — ABNORMAL LOW (ref >60)
Glucose, Bld: 128 mg/dL — ABNORMAL HIGH (ref 70–99)
Potassium: 3.8 mmol/L (ref 3.5–5.1)
Sodium: 139 mmol/L (ref 135–145)

## 2022-10-28 LAB — MAGNESIUM: Magnesium: 1.9 mg/dL (ref 1.7–2.4)

## 2022-10-28 LAB — PHOSPHORUS: Phosphorus: 3 mg/dL (ref 2.5–4.6)

## 2022-10-28 MED ORDER — PANTOPRAZOLE SODIUM 40 MG PO TBEC: 40.0000 mg | DELAYED_RELEASE_TABLET | Freq: Every day | ORAL | Status: DC

## 2022-10-28 MED ORDER — PANTOPRAZOLE SODIUM 40 MG PO TBEC
40.0000 mg | DELAYED_RELEASE_TABLET | Freq: Every day | ORAL | Status: DC
  Filled 2022-10-28: qty 1

## 2022-10-28 MED ORDER — PREDNISONE 50 MG PO TABS: 50.0000 mg | ORAL_TABLET | Freq: Every day | ORAL | Status: DC

## 2022-10-28 MED ORDER — PREDNISONE 20 MG PO TABS: 50.0000 mg | ORAL_TABLET | Freq: Every day | ORAL | Status: DC

## 2022-10-28 MED ORDER — AMLODIPINE BESYLATE 5 MG PO TABS: 10.0000 mg | ORAL_TABLET | Freq: Every day | ORAL | Status: AC

## 2022-10-28 MED ORDER — SUCRALFATE 1 GM/10ML PO SUSP: 1.0000 g | Freq: Three times a day (TID) | ORAL | 0 refills | Status: DC

## 2022-10-28 NOTE — Care Management Important Message (Signed)
Important Message  Patient Details  Name: [name] MRN: [id number] Date of Birth: 1955-06-21   Medicare Important Message Given:  Yes     Corey Harold 10/28/2022, 10:15 AM

## 2022-10-28 NOTE — Plan of Care (Signed)
  Problem: Education: Goal: Knowledge of risk factors and measures for prevention of condition will improve Outcome: Progressing   Problem: Respiratory: Goal: Complications related to the disease process, condition or treatment will be avoided or minimized Outcome: Progressing   Problem: Education: Goal: Knowledge of General Education information will improve Description: Including pain rating scale, medication(s)/side effects and non-pharmacologic comfort measures Outcome: Progressing   Problem: Health Behavior/Discharge Planning: Goal: Ability to manage health-related needs will improve Outcome: Progressing   Problem: Elimination: Goal: Will not experience complications related to bowel motility Outcome: Progressing   Problem: Education: Goal: Knowledge of risk factors and measures for prevention of condition will improve Outcome: Progressing

## 2022-10-28 NOTE — TOC Transition Note (Signed)
Transition of Care [facility]) - CM/SW Discharge Note   Patient Details  Name: [name] MRN: [id number] Date of Birth: Apr 05, 1955  [facility] CM/SW Contact:  Villa Herb, LCSWA Phone Number: 10/28/2022, 3:59 PM   Clinical Narrative:    CSW updated that insurance [employer]kley Harvey has been approved. MD completed D/C. CSW spoke to [place] with [facility] who states they are able to accept pt today. D/C clinicals sent in HUB to facility. CSW provided RN with numbers for room and report. CSW spoke with pt about D/C plan. CSW also spoke with pts family/friend at bedside who will provide transportation to facility. TOC signing off.   Final next level of care: Skilled Nursing Facility Barriers to Discharge: Barriers Resolved   Patient Goals and CMS Choice CMS Medicare.gov Compare Post Acute Care list provided to:: Patient Choice offered to / list presented to : Patient  Discharge Placement                  Patient to be transferred to facility by: family friend      Discharge Plan and Services Additional resources added to the After Visit Summary for   In-house Referral: Clinical Social Work Discharge Planning Services: CM Consult Post Acute Care Choice: Skilled Nursing Facility                               Social Determinants of Health (SDOH) Interventions SDOH Screenings   Food Insecurity: Patient Declined (10/23/2022)  Housing: Low Risk  (10/23/2022)  Transportation Needs: No Transportation Needs (10/23/2022)  Utilities: Not At Risk (10/23/2022)  Stress: Stress Concern Present (10/23/2022)  Tobacco Use: High Risk (10/22/2022)     Readmission Risk Interventions    10/25/2022    9:57 AM  Readmission Risk Prevention Plan  Transportation Screening Complete  Home Care Screening Complete  Medication Review (RN CM) Complete

## 2022-10-28 NOTE — Discharge Summary (Signed)
Physician Discharge Summary   Patient: [name] MRN: [id number] DOB: 12/08/1955  Admit date:     10/22/2022  Discharge date: 10/28/22  Discharge Physician: Onalee Hua Tripp Goins   PCP: Mirna Mires, MD   Recommendations at discharge:   Please follow up with primary care provider within 1-2 weeks  Please repeat BMP and CBC in one week     Hospital Course: 67 year old male with a history of blindness, hypertension, hyperlipidemia, tobacco abuse, alcohol abuse presenting with 4-day history of chest pain, coughing, myalgias, and loose stools.  The patient had some nausea without emesis.  He had some exertional dyspnea.  He states that the chest pain is worsened with coughing or deep breaths.  He denies any fever, chills, headache, neck pain, abdominal pain, hematochezia, melena. In the ED, the patient had a low-grade temperature 9 9.0 F.  He was hemodynamically stable.  Oxygen saturation was 89% on room air.  The patient was placed on 3 L with saturation 93-95%.  WBC 12.2, hemoglobin 17.7, platelets 546.  Sodium 132, potassium 2.8, bicarbonate 17, serum creatinine 8.37. COVID-19 PCR was positive.  CT of the abdomen and pelvis showed bibasilar atelectasis.  There was scattered air-fluid levels throughout the distal small bowel and proximal colon consistent with enteritis.  There is no high-grade small bowel obstruction.  There is no ureteral calculus.  The patient was started on IV fluids and IV steroids. He gradually improved albeit slow.  He was continued on IV fluids at 150cc/hr.  His renal function gradually improved and returned to baseline.  He was weaned to RA and remained stable.  He will finish 3 more days prednisone after d/c.    Assessment and Plan: Acute respiratory failure with hypoxia due COVID-19 -Presented with some dyspnea and hypoxia -secondary to COVID-19 infection -Stable on 3 L nasal cannula>>weaned to RA -continue IV Solu-Medrol>>prednisone x 3 more days after d/c -CRP  4.3>>2.8>>0.6 -Ferritin 503>>356>>188 -D-dimer 0.85>>0.48>>0.48 -received 3 days remdesivir   Hypotension -Initially--BPs trending down despite fluid resuscitation -UA--no pyuria -PCT 2.41>>0.79 -blood cultures x 2--neg to date -started linezolid/cefepime>> discontinue -lactic 1.9 -start levophed 40mcg>>>2mcg>>wean off am 9/20 -10/27/22--BP trending hypertensive again>>restart amlodipine -will not restart valsartan-HCTZ   Sinus bradycardia -Personally reviewed telemetry>> no high-grade AV block's -Mentation stable--patient was lucid -BP gradually improving--now off Levophed -BP remains stable with SPB 110s>>trending hypertensive again -HR in mid to upper 40s to low 50s   Odynophagia -consult GI --appreciated -Continue soft diet -Odynophagia is improving with sulcrafate -give sulcrafate ac/hs for one more week   Diarrhea -Check C. Difficile--no BM -Stool pathogen panel-no BM -Likely secondary to COVID-19 -resolved   Acute kidney injury -Baseline creatinine 1.3-1.6 -Presented with serum creatinine 8.37>>3.25>>2.16>>1.79>>1.67 -Secondary to volume depletion -Continue IV fluids>>improved -appreciate nephrology -serum creatinine improved to 1.41 at time of d/c   Elevated troponin -Secondary to demand ischemia -Echo EF 60-65%, G1DD, normal RVF -Troponin trend is flat -Personally reviewed EKG--sinus rhythm, no ST-T wave change.   Essential hypertension -Holding amlodipine, ARB, HCTZ secondary to soft blood pressure   Hypokalemia/Hypophosphatemia -Repleted -Check magnesium 2.7   Tobacco abuse -Tobacco cessation discussed       Consultants: none Procedures performed: none  Disposition: Home Diet recommendation:  Cardiac diet DISCHARGE MEDICATION: Allergies as of 10/28/2022       Reactions   Cortisone    Cortisone injection made him feel like something was crawling all over him.         Medication List     STOP taking  Physician Discharge Summary   Patient: [name] MRN: [id number] DOB: 12/08/1955  Admit date:     10/22/2022  Discharge date: 10/28/22  Discharge Physician: Onalee Hua Tripp Goins   PCP: Mirna Mires, MD   Recommendations at discharge:   Please follow up with primary care provider within 1-2 weeks  Please repeat BMP and CBC in one week     Hospital Course: 67 year old male with a history of blindness, hypertension, hyperlipidemia, tobacco abuse, alcohol abuse presenting with 4-day history of chest pain, coughing, myalgias, and loose stools.  The patient had some nausea without emesis.  He had some exertional dyspnea.  He states that the chest pain is worsened with coughing or deep breaths.  He denies any fever, chills, headache, neck pain, abdominal pain, hematochezia, melena. In the ED, the patient had a low-grade temperature 9 9.0 F.  He was hemodynamically stable.  Oxygen saturation was 89% on room air.  The patient was placed on 3 L with saturation 93-95%.  WBC 12.2, hemoglobin 17.7, platelets 546.  Sodium 132, potassium 2.8, bicarbonate 17, serum creatinine 8.37. COVID-19 PCR was positive.  CT of the abdomen and pelvis showed bibasilar atelectasis.  There was scattered air-fluid levels throughout the distal small bowel and proximal colon consistent with enteritis.  There is no high-grade small bowel obstruction.  There is no ureteral calculus.  The patient was started on IV fluids and IV steroids. He gradually improved albeit slow.  He was continued on IV fluids at 150cc/hr.  His renal function gradually improved and returned to baseline.  He was weaned to RA and remained stable.  He will finish 3 more days prednisone after d/c.    Assessment and Plan: Acute respiratory failure with hypoxia due COVID-19 -Presented with some dyspnea and hypoxia -secondary to COVID-19 infection -Stable on 3 L nasal cannula>>weaned to RA -continue IV Solu-Medrol>>prednisone x 3 more days after d/c -CRP  4.3>>2.8>>0.6 -Ferritin 503>>356>>188 -D-dimer 0.85>>0.48>>0.48 -received 3 days remdesivir   Hypotension -Initially--BPs trending down despite fluid resuscitation -UA--no pyuria -PCT 2.41>>0.79 -blood cultures x 2--neg to date -started linezolid/cefepime>> discontinue -lactic 1.9 -start levophed 40mcg>>>2mcg>>wean off am 9/20 -10/27/22--BP trending hypertensive again>>restart amlodipine -will not restart valsartan-HCTZ   Sinus bradycardia -Personally reviewed telemetry>> no high-grade AV block's -Mentation stable--patient was lucid -BP gradually improving--now off Levophed -BP remains stable with SPB 110s>>trending hypertensive again -HR in mid to upper 40s to low 50s   Odynophagia -consult GI --appreciated -Continue soft diet -Odynophagia is improving with sulcrafate -give sulcrafate ac/hs for one more week   Diarrhea -Check C. Difficile--no BM -Stool pathogen panel-no BM -Likely secondary to COVID-19 -resolved   Acute kidney injury -Baseline creatinine 1.3-1.6 -Presented with serum creatinine 8.37>>3.25>>2.16>>1.79>>1.67 -Secondary to volume depletion -Continue IV fluids>>improved -appreciate nephrology -serum creatinine improved to 1.41 at time of d/c   Elevated troponin -Secondary to demand ischemia -Echo EF 60-65%, G1DD, normal RVF -Troponin trend is flat -Personally reviewed EKG--sinus rhythm, no ST-T wave change.   Essential hypertension -Holding amlodipine, ARB, HCTZ secondary to soft blood pressure   Hypokalemia/Hypophosphatemia -Repleted -Check magnesium 2.7   Tobacco abuse -Tobacco cessation discussed       Consultants: none Procedures performed: none  Disposition: Home Diet recommendation:  Cardiac diet DISCHARGE MEDICATION: Allergies as of 10/28/2022       Reactions   Cortisone    Cortisone injection made him feel like something was crawling all over him.         Medication List     STOP taking  Physician Discharge Summary   Patient: [name] MRN: [id number] DOB: 12/08/1955  Admit date:     10/22/2022  Discharge date: 10/28/22  Discharge Physician: Onalee Hua Tripp Goins   PCP: Mirna Mires, MD   Recommendations at discharge:   Please follow up with primary care provider within 1-2 weeks  Please repeat BMP and CBC in one week     Hospital Course: 67 year old male with a history of blindness, hypertension, hyperlipidemia, tobacco abuse, alcohol abuse presenting with 4-day history of chest pain, coughing, myalgias, and loose stools.  The patient had some nausea without emesis.  He had some exertional dyspnea.  He states that the chest pain is worsened with coughing or deep breaths.  He denies any fever, chills, headache, neck pain, abdominal pain, hematochezia, melena. In the ED, the patient had a low-grade temperature 9 9.0 F.  He was hemodynamically stable.  Oxygen saturation was 89% on room air.  The patient was placed on 3 L with saturation 93-95%.  WBC 12.2, hemoglobin 17.7, platelets 546.  Sodium 132, potassium 2.8, bicarbonate 17, serum creatinine 8.37. COVID-19 PCR was positive.  CT of the abdomen and pelvis showed bibasilar atelectasis.  There was scattered air-fluid levels throughout the distal small bowel and proximal colon consistent with enteritis.  There is no high-grade small bowel obstruction.  There is no ureteral calculus.  The patient was started on IV fluids and IV steroids. He gradually improved albeit slow.  He was continued on IV fluids at 150cc/hr.  His renal function gradually improved and returned to baseline.  He was weaned to RA and remained stable.  He will finish 3 more days prednisone after d/c.    Assessment and Plan: Acute respiratory failure with hypoxia due COVID-19 -Presented with some dyspnea and hypoxia -secondary to COVID-19 infection -Stable on 3 L nasal cannula>>weaned to RA -continue IV Solu-Medrol>>prednisone x 3 more days after d/c -CRP  4.3>>2.8>>0.6 -Ferritin 503>>356>>188 -D-dimer 0.85>>0.48>>0.48 -received 3 days remdesivir   Hypotension -Initially--BPs trending down despite fluid resuscitation -UA--no pyuria -PCT 2.41>>0.79 -blood cultures x 2--neg to date -started linezolid/cefepime>> discontinue -lactic 1.9 -start levophed 40mcg>>>2mcg>>wean off am 9/20 -10/27/22--BP trending hypertensive again>>restart amlodipine -will not restart valsartan-HCTZ   Sinus bradycardia -Personally reviewed telemetry>> no high-grade AV block's -Mentation stable--patient was lucid -BP gradually improving--now off Levophed -BP remains stable with SPB 110s>>trending hypertensive again -HR in mid to upper 40s to low 50s   Odynophagia -consult GI --appreciated -Continue soft diet -Odynophagia is improving with sulcrafate -give sulcrafate ac/hs for one more week   Diarrhea -Check C. Difficile--no BM -Stool pathogen panel-no BM -Likely secondary to COVID-19 -resolved   Acute kidney injury -Baseline creatinine 1.3-1.6 -Presented with serum creatinine 8.37>>3.25>>2.16>>1.79>>1.67 -Secondary to volume depletion -Continue IV fluids>>improved -appreciate nephrology -serum creatinine improved to 1.41 at time of d/c   Elevated troponin -Secondary to demand ischemia -Echo EF 60-65%, G1DD, normal RVF -Troponin trend is flat -Personally reviewed EKG--sinus rhythm, no ST-T wave change.   Essential hypertension -Holding amlodipine, ARB, HCTZ secondary to soft blood pressure   Hypokalemia/Hypophosphatemia -Repleted -Check magnesium 2.7   Tobacco abuse -Tobacco cessation discussed       Consultants: none Procedures performed: none  Disposition: Home Diet recommendation:  Cardiac diet DISCHARGE MEDICATION: Allergies as of 10/28/2022       Reactions   Cortisone    Cortisone injection made him feel like something was crawling all over him.         Medication List     STOP taking  these medications     HYDROcodone-acetaminophen 7.5-325 MG tablet Commonly known as: NORCO   valsartan-hydrochlorothiazide 160-25 MG tablet Commonly known as: DIOVAN-HCT       TAKE these medications    amLODipine 5 MG tablet Commonly known as: NORVASC Take 2 tablets (10 mg total) by mouth daily. What changed: medication strength   aspirin 81 MG chewable tablet Chew 81 mg by mouth daily.   atorvastatin 10 MG tablet Commonly known as: LIPITOR Take 10 mg by mouth daily.   pantoprazole 40 MG tablet Commonly known as: PROTONIX Take 1 tablet (40 mg total) by mouth daily.   predniSONE 50 MG tablet Commonly known as: DELTASONE Take 1 tablet (50 mg total) by mouth daily with breakfast. X 3 days Start taking on: October 29, 2022   sucralfate 1 GM/10ML suspension Commonly known as: CARAFATE Take 10 mLs (1 g total) by mouth 4 (four) times daily -  with meals and at bedtime. X 1 week        Contact information for after-discharge care     Destination     [facility] .   Service: Skilled Nursing Contact information: [address] [place] [address] [phone]                    Discharge Exam: Ceasar Mons Weights   10/22/22 1539 10/23/22 0900  Weight: 90.4 kg 90.6 kg   HEENT:  [employer]/AT, No thrush, no icterus CV:  RRR, no rub, no S3, no S4 Lung:  CTA, no wheeze, no rhonchi Abd:  soft/+BS, NT Ext:  No edema, no lymphangitis, no synovitis, no rash   Condition at discharge: stable  The results of significant diagnostics from this hospitalization (including imaging, microbiology, ancillary and laboratory) are listed below for reference.   Imaging Studies: [place] EKG SITE RITE  Result Date: 10/24/2022 If Site Rite image not attached, placement could not be confirmed due to current cardiac rhythm.  ECHOCARDIOGRAM COMPLETE  Result Date: 10/23/2022    ECHOCARDIOGRAM REPORT   Patient Name:   PACEY KOVAC Date of Exam:  10/23/2022 Medical Rec #:  [id number]        Height:       70.0 in Accession #:    [id number]       Weight:       199.7 lb Date of Birth:  1955-06-03       BSA:          2.086 m Patient Age:    66 years         BP:           88/49 mmHg Patient Gender: M                HR:           67 bpm. Exam Location:  Inpatient Procedure: 2D Echo, Cardiac Doppler and Color Doppler Indications:    Chest Pain R07.9  History:        Patient has no prior history of Echocardiogram examinations.                 Risk Factors:Hypertension, Dyslipidemia and Current Smoker.  Sonographer:    Lucendia Herrlich RCS Referring Phys: [phone] Brianda Beitler  Sonographer Comments: Image acquisition challenging due to uncooperative patient. IMPRESSIONS  1. Left ventricular ejection fraction, by estimation, is 60 to 65%. The left ventricle has normal function. Left ventricular endocardial border not optimally defined to evaluate regional wall  Physician Discharge Summary   Patient: [name] MRN: [id number] DOB: 12/08/1955  Admit date:     10/22/2022  Discharge date: 10/28/22  Discharge Physician: Onalee Hua Tripp Goins   PCP: Mirna Mires, MD   Recommendations at discharge:   Please follow up with primary care provider within 1-2 weeks  Please repeat BMP and CBC in one week     Hospital Course: 67 year old male with a history of blindness, hypertension, hyperlipidemia, tobacco abuse, alcohol abuse presenting with 4-day history of chest pain, coughing, myalgias, and loose stools.  The patient had some nausea without emesis.  He had some exertional dyspnea.  He states that the chest pain is worsened with coughing or deep breaths.  He denies any fever, chills, headache, neck pain, abdominal pain, hematochezia, melena. In the ED, the patient had a low-grade temperature 9 9.0 F.  He was hemodynamically stable.  Oxygen saturation was 89% on room air.  The patient was placed on 3 L with saturation 93-95%.  WBC 12.2, hemoglobin 17.7, platelets 546.  Sodium 132, potassium 2.8, bicarbonate 17, serum creatinine 8.37. COVID-19 PCR was positive.  CT of the abdomen and pelvis showed bibasilar atelectasis.  There was scattered air-fluid levels throughout the distal small bowel and proximal colon consistent with enteritis.  There is no high-grade small bowel obstruction.  There is no ureteral calculus.  The patient was started on IV fluids and IV steroids. He gradually improved albeit slow.  He was continued on IV fluids at 150cc/hr.  His renal function gradually improved and returned to baseline.  He was weaned to RA and remained stable.  He will finish 3 more days prednisone after d/c.    Assessment and Plan: Acute respiratory failure with hypoxia due COVID-19 -Presented with some dyspnea and hypoxia -secondary to COVID-19 infection -Stable on 3 L nasal cannula>>weaned to RA -continue IV Solu-Medrol>>prednisone x 3 more days after d/c -CRP  4.3>>2.8>>0.6 -Ferritin 503>>356>>188 -D-dimer 0.85>>0.48>>0.48 -received 3 days remdesivir   Hypotension -Initially--BPs trending down despite fluid resuscitation -UA--no pyuria -PCT 2.41>>0.79 -blood cultures x 2--neg to date -started linezolid/cefepime>> discontinue -lactic 1.9 -start levophed 40mcg>>>2mcg>>wean off am 9/20 -10/27/22--BP trending hypertensive again>>restart amlodipine -will not restart valsartan-HCTZ   Sinus bradycardia -Personally reviewed telemetry>> no high-grade AV block's -Mentation stable--patient was lucid -BP gradually improving--now off Levophed -BP remains stable with SPB 110s>>trending hypertensive again -HR in mid to upper 40s to low 50s   Odynophagia -consult GI --appreciated -Continue soft diet -Odynophagia is improving with sulcrafate -give sulcrafate ac/hs for one more week   Diarrhea -Check C. Difficile--no BM -Stool pathogen panel-no BM -Likely secondary to COVID-19 -resolved   Acute kidney injury -Baseline creatinine 1.3-1.6 -Presented with serum creatinine 8.37>>3.25>>2.16>>1.79>>1.67 -Secondary to volume depletion -Continue IV fluids>>improved -appreciate nephrology -serum creatinine improved to 1.41 at time of d/c   Elevated troponin -Secondary to demand ischemia -Echo EF 60-65%, G1DD, normal RVF -Troponin trend is flat -Personally reviewed EKG--sinus rhythm, no ST-T wave change.   Essential hypertension -Holding amlodipine, ARB, HCTZ secondary to soft blood pressure   Hypokalemia/Hypophosphatemia -Repleted -Check magnesium 2.7   Tobacco abuse -Tobacco cessation discussed       Consultants: none Procedures performed: none  Disposition: Home Diet recommendation:  Cardiac diet DISCHARGE MEDICATION: Allergies as of 10/28/2022       Reactions   Cortisone    Cortisone injection made him feel like something was crawling all over him.         Medication List     STOP taking  Physician Discharge Summary   Patient: [name] MRN: [id number] DOB: 12/08/1955  Admit date:     10/22/2022  Discharge date: 10/28/22  Discharge Physician: Onalee Hua Tripp Goins   PCP: Mirna Mires, MD   Recommendations at discharge:   Please follow up with primary care provider within 1-2 weeks  Please repeat BMP and CBC in one week     Hospital Course: 67 year old male with a history of blindness, hypertension, hyperlipidemia, tobacco abuse, alcohol abuse presenting with 4-day history of chest pain, coughing, myalgias, and loose stools.  The patient had some nausea without emesis.  He had some exertional dyspnea.  He states that the chest pain is worsened with coughing or deep breaths.  He denies any fever, chills, headache, neck pain, abdominal pain, hematochezia, melena. In the ED, the patient had a low-grade temperature 9 9.0 F.  He was hemodynamically stable.  Oxygen saturation was 89% on room air.  The patient was placed on 3 L with saturation 93-95%.  WBC 12.2, hemoglobin 17.7, platelets 546.  Sodium 132, potassium 2.8, bicarbonate 17, serum creatinine 8.37. COVID-19 PCR was positive.  CT of the abdomen and pelvis showed bibasilar atelectasis.  There was scattered air-fluid levels throughout the distal small bowel and proximal colon consistent with enteritis.  There is no high-grade small bowel obstruction.  There is no ureteral calculus.  The patient was started on IV fluids and IV steroids. He gradually improved albeit slow.  He was continued on IV fluids at 150cc/hr.  His renal function gradually improved and returned to baseline.  He was weaned to RA and remained stable.  He will finish 3 more days prednisone after d/c.    Assessment and Plan: Acute respiratory failure with hypoxia due COVID-19 -Presented with some dyspnea and hypoxia -secondary to COVID-19 infection -Stable on 3 L nasal cannula>>weaned to RA -continue IV Solu-Medrol>>prednisone x 3 more days after d/c -CRP  4.3>>2.8>>0.6 -Ferritin 503>>356>>188 -D-dimer 0.85>>0.48>>0.48 -received 3 days remdesivir   Hypotension -Initially--BPs trending down despite fluid resuscitation -UA--no pyuria -PCT 2.41>>0.79 -blood cultures x 2--neg to date -started linezolid/cefepime>> discontinue -lactic 1.9 -start levophed 40mcg>>>2mcg>>wean off am 9/20 -10/27/22--BP trending hypertensive again>>restart amlodipine -will not restart valsartan-HCTZ   Sinus bradycardia -Personally reviewed telemetry>> no high-grade AV block's -Mentation stable--patient was lucid -BP gradually improving--now off Levophed -BP remains stable with SPB 110s>>trending hypertensive again -HR in mid to upper 40s to low 50s   Odynophagia -consult GI --appreciated -Continue soft diet -Odynophagia is improving with sulcrafate -give sulcrafate ac/hs for one more week   Diarrhea -Check C. Difficile--no BM -Stool pathogen panel-no BM -Likely secondary to COVID-19 -resolved   Acute kidney injury -Baseline creatinine 1.3-1.6 -Presented with serum creatinine 8.37>>3.25>>2.16>>1.79>>1.67 -Secondary to volume depletion -Continue IV fluids>>improved -appreciate nephrology -serum creatinine improved to 1.41 at time of d/c   Elevated troponin -Secondary to demand ischemia -Echo EF 60-65%, G1DD, normal RVF -Troponin trend is flat -Personally reviewed EKG--sinus rhythm, no ST-T wave change.   Essential hypertension -Holding amlodipine, ARB, HCTZ secondary to soft blood pressure   Hypokalemia/Hypophosphatemia -Repleted -Check magnesium 2.7   Tobacco abuse -Tobacco cessation discussed       Consultants: none Procedures performed: none  Disposition: Home Diet recommendation:  Cardiac diet DISCHARGE MEDICATION: Allergies as of 10/28/2022       Reactions   Cortisone    Cortisone injection made him feel like something was crawling all over him.         Medication List     STOP taking

## 2022-10-29 LAB — CULTURE, BLOOD (ROUTINE X 2)
Culture: NO GROWTH
Culture: NO GROWTH
Special Requests: ADEQUATE
Special Requests: ADEQUATE

## 2022-10-30 DIAGNOSIS — I10 Essential (primary) hypertension: Secondary | ICD-10-CM | POA: Diagnosis not present

## 2022-10-30 DIAGNOSIS — U071 COVID-19: Secondary | ICD-10-CM | POA: Diagnosis not present

## 2022-10-30 DIAGNOSIS — R001 Bradycardia, unspecified: Secondary | ICD-10-CM | POA: Diagnosis not present

## 2022-10-30 DIAGNOSIS — K219 Gastro-esophageal reflux disease without esophagitis: Secondary | ICD-10-CM | POA: Diagnosis not present

## 2022-10-30 DIAGNOSIS — J9601 Acute respiratory failure with hypoxia: Secondary | ICD-10-CM | POA: Diagnosis not present

## 2022-10-30 DIAGNOSIS — M6281 Muscle weakness (generalized): Secondary | ICD-10-CM | POA: Diagnosis not present

## 2022-11-01 DIAGNOSIS — U071 COVID-19: Secondary | ICD-10-CM | POA: Diagnosis not present

## 2022-11-01 DIAGNOSIS — M6281 Muscle weakness (generalized): Secondary | ICD-10-CM | POA: Diagnosis not present

## 2022-11-01 DIAGNOSIS — E785 Hyperlipidemia, unspecified: Secondary | ICD-10-CM | POA: Diagnosis not present

## 2022-11-01 DIAGNOSIS — I10 Essential (primary) hypertension: Secondary | ICD-10-CM | POA: Diagnosis not present

## 2022-11-01 DIAGNOSIS — K219 Gastro-esophageal reflux disease without esophagitis: Secondary | ICD-10-CM | POA: Diagnosis not present

## 2022-11-05 ENCOUNTER — Telehealth: Payer: Self-pay | Admitting: Orthopaedic Surgery

## 2022-11-05 NOTE — Telephone Encounter (Signed)
Dr. Sanjuan Dame pt - spoke w/the pt, he is requesting a refill on Hydrocodone 7.5-325 to be sent to [facility]

## 2022-11-06 MED ORDER — HYDROCODONE-ACETAMINOPHEN 7.5-325 MG PO TABS
1.0000 | ORAL_TABLET | Freq: Four times a day (QID) | ORAL | 0 refills | Status: AC | PRN
Start: 2022-11-06 — End: ?

## 2022-11-13 ENCOUNTER — Encounter: Payer: Self-pay | Admitting: Internal Medicine

## 2022-11-13 NOTE — Telephone Encounter (Signed)
Mailed letter

## 2023-01-14 DIAGNOSIS — F5221 Male erectile disorder: Secondary | ICD-10-CM | POA: Diagnosis not present

## 2023-01-14 DIAGNOSIS — Z720 Tobacco use: Secondary | ICD-10-CM | POA: Diagnosis not present

## 2023-01-14 DIAGNOSIS — Z6830 Body mass index (BMI) 30.0-30.9, adult: Secondary | ICD-10-CM | POA: Diagnosis not present

## 2023-01-14 DIAGNOSIS — R6889 Other general symptoms and signs: Secondary | ICD-10-CM | POA: Diagnosis not present

## 2023-01-14 DIAGNOSIS — H548 Legal blindness, as defined in USA: Secondary | ICD-10-CM | POA: Diagnosis not present

## 2023-01-14 DIAGNOSIS — I10 Essential (primary) hypertension: Secondary | ICD-10-CM | POA: Diagnosis not present

## 2023-01-14 DIAGNOSIS — E042 Nontoxic multinodular goiter: Secondary | ICD-10-CM | POA: Diagnosis not present

## 2023-01-14 DIAGNOSIS — J432 Centrilobular emphysema: Secondary | ICD-10-CM | POA: Diagnosis not present

## 2023-01-14 DIAGNOSIS — S32010D Wedge compression fracture of first lumbar vertebra, subsequent encounter for fracture with routine healing: Secondary | ICD-10-CM | POA: Diagnosis not present

## 2023-01-15 ENCOUNTER — Other Ambulatory Visit (HOSPITAL_COMMUNITY): Payer: Self-pay | Admitting: Family Medicine

## 2023-01-15 ENCOUNTER — Encounter (HOSPITAL_COMMUNITY): Payer: Self-pay | Admitting: Family Medicine

## 2023-01-15 DIAGNOSIS — F17210 Nicotine dependence, cigarettes, uncomplicated: Secondary | ICD-10-CM

## 2023-03-24 DIAGNOSIS — N179 Acute kidney failure, unspecified: Secondary | ICD-10-CM | POA: Diagnosis not present

## 2023-03-24 DIAGNOSIS — E785 Hyperlipidemia, unspecified: Secondary | ICD-10-CM | POA: Diagnosis not present

## 2023-03-24 DIAGNOSIS — N1831 Chronic kidney disease, stage 3a: Secondary | ICD-10-CM | POA: Diagnosis not present

## 2023-03-24 DIAGNOSIS — I129 Hypertensive chronic kidney disease with stage 1 through stage 4 chronic kidney disease, or unspecified chronic kidney disease: Secondary | ICD-10-CM | POA: Diagnosis not present

## 2023-03-29 ENCOUNTER — Ambulatory Visit
Admission: EM | Admit: 2023-03-29 | Discharge: 2023-03-29 | Disposition: A | Discharge status: Home / Self Care | Payer: Medicare HMO | Attending: Family Medicine | Admitting: Family Medicine

## 2023-03-29 DIAGNOSIS — M25561 Pain in right knee: Secondary | ICD-10-CM | POA: Diagnosis not present

## 2023-03-29 DIAGNOSIS — G8929 Other chronic pain: Secondary | ICD-10-CM | POA: Diagnosis not present

## 2023-03-29 DIAGNOSIS — L723 Sebaceous cyst: Secondary | ICD-10-CM

## 2023-03-29 DIAGNOSIS — M79642 Pain in left hand: Secondary | ICD-10-CM

## 2023-03-29 DIAGNOSIS — M79641 Pain in right hand: Secondary | ICD-10-CM

## 2023-03-29 MED ORDER — CHLORHEXIDINE GLUCONATE 4 % EX SOLN: Freq: Every day | CUTANEOUS | 0 refills | Status: DC | PRN

## 2023-03-29 NOTE — ED Triage Notes (Signed)
 Pt reports he has multiple small boils under left armpit x 1 month. States they have drained.

## 2023-03-29 NOTE — Discharge Instructions (Signed)
 I have sent in Hibiclens solution for you to wash the underarm with about 3 times a week to keep skin bacteria down and reduce risk of reinfections in the area.  Follow-up with primary care if the areas become infected or drained again.  For your knee pain and hand pain, you may apply Voltaren gel to these areas, take ibuprofen and Tylenol, ice, and follow-up with primary care if not improving with these methods

## 2023-03-29 NOTE — ED Provider Notes (Signed)
 [facility]    CSN: [id number] Arrival date & time: 03/29/23  1225      History   Chief Complaint No chief complaint on file.   HPI [name] is a 68 y.o. male.   Presenting today with 1 month history of small boils in the left axilla.  States they had become tender and swollen and he was able to squeeze them and get drainage out.  They are no longer painful or draining but his girlfriend wanted him to get them checked out to make sure he did not have MRSA.  Using Dial soap, otherwise not tried anything over-the-counter for symptoms.  He is also complaining of intermittent ongoing right knee pain, occasional swelling.  Denies injury, loss of range of motion, numbness, tingling, weakness.  Also having ongoing issues with bilateral hand pain, tingling, stiffness.  No known injury to this region.    Past Medical History:  Diagnosis Date   Alcohol abuse    Blind    Hyperlipidemia    Hypertension     Patient Active Problem List   Diagnosis Date Noted   Odynophagia 10/26/2022   Oropharyngeal dysphagia 10/26/2022   Acute respiratory failure with hypoxia (HCC) 10/23/2022   AKI (acute kidney injury) (HCC) 10/22/2022   COVID-19 virus infection 10/22/2022   Dehydration 10/22/2022   Elevated troponin 10/22/2022   Colon cancer screening 05/25/2020   Alcohol abuse with intoxication (HCC)    Anemia due to vitamin B12 deficiency    Right leg numbness 05/07/2017   Alcohol abuse 05/07/2017   Hypokalemia 05/07/2017   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 02/22/2008   OBESITY 01/08/2007   Hyperlipidemia 12/25/2005   TOBACCO ABUSE 12/25/2005   BLINDNESS, LEGAL, [place] DEFINITION 12/25/2005   Essential hypertension 12/25/2005   Trigger finger, acquired 12/25/2005    Past Surgical History:  Procedure Laterality Date   CATARACT EXTRACTION W/PHACO Right 09/26/2014   Procedure: CATARACT EXTRACTION PHACO AND INTRAOCULAR LENS  PLACEMENT RIGHT EYE CDE=18.56;  Surgeon: Gemma Payor, MD;  Location: [facility];  Service: Ophthalmology;  Laterality: Right;   COLONOSCOPY WITH PROPOFOL N/A 07/07/2020   Procedure: COLONOSCOPY WITH PROPOFOL;  Surgeon: Lanelle Bal, DO;  Location: [facility];  Service: Endoscopy;  Laterality: N/A;  10:00am   POLYPECTOMY  07/07/2020   Procedure: POLYPECTOMY;  Surgeon: Lanelle Bal, DO;  Location: [facility];  Service: Endoscopy;;   ROTATOR CUFF REPAIR Left 2012   TOOTH EXTRACTION N/A 1996       Home Medications    Prior to Admission medications   Medication Sig Start Date End Date Taking? Authorizing Provider  chlorhexidine (HIBICLENS) 4 % external liquid Apply topically daily as needed. 03/29/23  Yes Particia Nearing, PA-C  amLODipine (NORVASC) 5 MG tablet Take 2 tablets (10 mg total) by mouth daily. 10/28/22   Catarina Hartshorn, MD  aspirin 81 MG chewable tablet Chew 81 mg by mouth daily.    [provider]  atorvastatin (LIPITOR) 10 MG tablet Take 10 mg by mouth daily.    [provider]  HYDROcodone-acetaminophen (NORCO) 7.5-325 MG tablet Take 1 tablet by mouth every 6 (six) hours as needed for moderate pain. 11/06/22   Darreld Mclean, MD  pantoprazole (PROTONIX) 40 MG tablet Take 1 tablet (40 mg total) by mouth daily. 10/28/22   Catarina Hartshorn, MD  predniSONE (DELTASONE) 50 MG tablet Take 1 tablet (50 mg total) by mouth daily with breakfast. X 3  days 10/29/22   Catarina Hartshorn, MD  sucralfate (CARAFATE) 1 GM/10ML suspension Take 10 mLs (1 g total) by mouth 4 (four) times daily -  with meals and at bedtime. X 1 week 10/28/22   Catarina Hartshorn, MD    Family History Family History  Problem Relation Age of Onset   Cancer Sister 44       breast ca   Hypertension Sister    Hypertension Sister    Hypertension Sister    Hypertension Sister    Hypertension Brother    Hypertension Brother    Hypertension Brother    Colon cancer Neg Hx     Social History Social History    Tobacco Use   Smoking status: Every Day    Current packs/day: 0.50    Average packs/day: 0.5 packs/day for 20.0 years (10.0 ttl pk-yrs)    Types: Cigarettes   Smokeless tobacco: Never  Vaping Use   Vaping status: Never Used  Substance Use Topics   Alcohol use: Not Currently    Alcohol/week: 1.0 standard drink of alcohol    Types: 1 Glasses of wine per week    Comment: used to drink heavily. Last drank years ago.    Drug use: No     Allergies   Cortisone   Review of Systems Review of Systems Per HPI  Physical Exam Triage Vital Signs ED Triage Vitals  Encounter Vitals Group     BP 03/29/23 1327 119/75     Systolic BP Percentile --      Diastolic BP Percentile --      Pulse Rate 03/29/23 1327 64     Resp 03/29/23 1327 18     Temp 03/29/23 1327 98.2 F (36.8 C)     Temp Source 03/29/23 1327 Oral     SpO2 03/29/23 1327 96 %     Weight --      Height --      Head Circumference --      Peak Flow --      Pain Score 03/29/23 1329 0     Pain Loc --      Pain Education --      Exclude from Growth Chart --    No data found.  Updated Vital Signs BP 119/75 (BP Location: Right Arm)   Pulse 64   Temp 98.2 F (36.8 C) (Oral)   Resp 18   SpO2 96%   Visual Acuity Right Eye Distance:   Left Eye Distance:   Bilateral Distance:    Right Eye Near:   Left Eye Near:    Bilateral Near:     Physical Exam Vitals and nursing note reviewed.  Constitutional:      Appearance: Normal appearance.  HENT:     Head: Atraumatic.     Mouth/Throat:     Mouth: Mucous membranes are moist.  Eyes:     Extraocular Movements: Extraocular movements intact.     Conjunctiva/sclera: Conjunctivae normal.  Cardiovascular:     Rate and Rhythm: Normal rate and regular rhythm.  Pulmonary:     Effort: Pulmonary effort is normal.     Breath sounds: Normal breath sounds.  Musculoskeletal:        General: No swelling, tenderness or signs of injury. Normal range of motion.     Cervical  back: Normal range of motion and neck supple.  Skin:    General: Skin is warm and dry.     Findings: No bruising or erythema.  Comments: 2 firm cystic nodules to left axilla, nonfluctuant, not erythematous, not currently draining or tender  Neurological:     General: No focal deficit present.     Mental Status: He is oriented to person, place, and time.     Motor: No weakness.     Gait: Gait normal.     Comments: All 4 extremities neurovascular intact  Psychiatric:        Mood and Affect: Mood normal.        Thought Content: Thought content normal.        Judgment: Judgment normal.      UC Treatments / Results  Labs (all labs ordered are listed, but only abnormal results are displayed) Labs Reviewed - No data to display  EKG   Radiology No results found.  Procedures Procedures (including critical care time)  Medications Ordered in UC Medications - No data to display  Initial Impression / Assessment and Plan / UC Course  I have reviewed the triage vital signs and the nursing notes.  Pertinent labs & imaging results that were available during my care of the patient were reviewed by me and considered in my medical decision making (see chart for details).     Suspect osteoarthritis type changes causing his chronic right knee and bilateral hand pains.  Discussed Voltaren gel, over-the-counter pain relievers, ice, elevation.  Regarding the left axillary masses, suspect noninfected sebaceous cyst.  Discussed cleaning the area with Hibiclens several times a week to keep skin bacteria down, PCP follow-up if draining at any time again. Final Clinical Impressions(s) / UC Diagnoses   Final diagnoses:  Sebaceous cyst of left axilla  Chronic pain of right knee  Bilateral hand pain     Discharge Instructions      I have sent in Hibiclens solution for you to wash the underarm with about 3 times a week to keep skin bacteria down and reduce risk of reinfections in the area.   Follow-up with primary care if the areas become infected or drained again.  For your knee pain and hand pain, you may apply Voltaren gel to these areas, take ibuprofen and Tylenol, ice, and follow-up with primary care if not improving with these methods    ED Prescriptions     Medication Sig Dispense Auth. Provider   chlorhexidine (HIBICLENS) 4 % external liquid Apply topically daily as needed. 236 mL Particia Nearing, [place]      PDMP not reviewed this encounter.   Particia Nearing, [place] 03/29/23 1539

## 2023-06-13 ENCOUNTER — Encounter (INDEPENDENT_AMBULATORY_CARE_PROVIDER_SITE_OTHER): Payer: Self-pay | Admitting: *Deleted

## 2023-07-28 DIAGNOSIS — N1831 Chronic kidney disease, stage 3a: Secondary | ICD-10-CM | POA: Diagnosis not present

## 2023-07-28 DIAGNOSIS — I129 Hypertensive chronic kidney disease with stage 1 through stage 4 chronic kidney disease, or unspecified chronic kidney disease: Secondary | ICD-10-CM | POA: Diagnosis not present

## 2023-10-14 ENCOUNTER — Ambulatory Visit: Admitting: Gastroenterology

## 2023-10-14 DIAGNOSIS — J432 Centrilobular emphysema: Secondary | ICD-10-CM | POA: Diagnosis not present

## 2023-10-14 DIAGNOSIS — Z125 Encounter for screening for malignant neoplasm of prostate: Secondary | ICD-10-CM | POA: Diagnosis not present

## 2023-10-14 DIAGNOSIS — F5221 Male erectile disorder: Secondary | ICD-10-CM | POA: Diagnosis not present

## 2023-10-14 DIAGNOSIS — Z720 Tobacco use: Secondary | ICD-10-CM | POA: Diagnosis not present

## 2023-10-14 DIAGNOSIS — H548 Legal blindness, as defined in USA: Secondary | ICD-10-CM | POA: Diagnosis not present

## 2023-10-14 DIAGNOSIS — E042 Nontoxic multinodular goiter: Secondary | ICD-10-CM | POA: Diagnosis not present

## 2023-10-14 DIAGNOSIS — S32010D Wedge compression fracture of first lumbar vertebra, subsequent encounter for fracture with routine healing: Secondary | ICD-10-CM | POA: Diagnosis not present

## 2023-10-14 DIAGNOSIS — I10 Essential (primary) hypertension: Secondary | ICD-10-CM | POA: Diagnosis not present

## 2023-10-14 DIAGNOSIS — Z6829 Body mass index (BMI) 29.0-29.9, adult: Secondary | ICD-10-CM | POA: Diagnosis not present

## 2023-10-17 ENCOUNTER — Other Ambulatory Visit (HOSPITAL_COMMUNITY): Payer: Self-pay | Admitting: Family Medicine

## 2023-10-17 DIAGNOSIS — F17200 Nicotine dependence, unspecified, uncomplicated: Secondary | ICD-10-CM

## 2023-10-17 DIAGNOSIS — F17210 Nicotine dependence, cigarettes, uncomplicated: Secondary | ICD-10-CM

## 2023-10-28 ENCOUNTER — Ambulatory Visit (HOSPITAL_COMMUNITY)
Admission: RE | Admit: 2023-10-28 | Discharge: 2023-10-28 | Disposition: A | Discharge status: Home / Self Care | Source: Ambulatory Visit | Attending: Family Medicine | Admitting: Family Medicine

## 2023-10-28 DIAGNOSIS — F17210 Nicotine dependence, cigarettes, uncomplicated: Secondary | ICD-10-CM | POA: Diagnosis not present

## 2023-10-28 DIAGNOSIS — F17200 Nicotine dependence, unspecified, uncomplicated: Secondary | ICD-10-CM | POA: Insufficient documentation

## 2023-12-11 ENCOUNTER — Ambulatory Visit: Admitting: Gastroenterology

## 2023-12-11 ENCOUNTER — Encounter: Payer: Self-pay | Admitting: *Deleted

## 2023-12-11 ENCOUNTER — Encounter: Payer: Self-pay | Admitting: Gastroenterology

## 2023-12-11 ENCOUNTER — Other Ambulatory Visit: Payer: Self-pay | Admitting: *Deleted

## 2023-12-11 ENCOUNTER — Telehealth: Payer: Self-pay | Admitting: *Deleted

## 2023-12-11 VITALS — BP 135/74 | HR 71 | Temp 97.9°F | Ht 70.0 in | Wt 192.0 lb

## 2023-12-11 DIAGNOSIS — M7989 Other specified soft tissue disorders: Secondary | ICD-10-CM

## 2023-12-11 DIAGNOSIS — K5900 Constipation, unspecified: Secondary | ICD-10-CM

## 2023-12-11 DIAGNOSIS — E739 Lactose intolerance, unspecified: Secondary | ICD-10-CM | POA: Diagnosis not present

## 2023-12-11 DIAGNOSIS — Z860100 Personal history of colon polyps, unspecified: Secondary | ICD-10-CM | POA: Diagnosis not present

## 2023-12-11 MED ORDER — PEG 3350-KCL-NA BICARB-NACL 420 G PO SOLR: 4000.0000 mL | Freq: Once | ORAL | 0 refills | Status: AC

## 2023-12-11 NOTE — H&P (View-Only) (Signed)
 GI Office Note    Referring Provider: Leigh Lung, MD Primary Care Physician:  Leigh Lung, MD Primary Gastroenterologist: Carlin POUR. Cindie, DO  Date:  12/11/2023  ID:  [name], DOB 1955/11/13, MRN [id number]   Chief Complaint   Chief Complaint  Patient presents with   Colonoscopy    Colonoscopy screening    History of Present Illness  [name] is a 68 y.o. male with a history of HLD, HTN, prior alcohol abuse, and is legally blind presenting today to discuss scheduling his surveillance colonoscopy.  Last office visit in 2022 with Josette Centers, PA-C.  He denied any alarm symptoms or any symptoms altogether from upper or lower GI stance.  He reported he will used to drink alcohol heavily on the weekends but had not had any large amounts of frequent use recently.  He is scheduled for colonoscopy  Colonoscopy June 2022: -6 mm polyp in the cecum -2 mm polyp in the cecum -3 polyps in the transverse colon -Pathology consistent with tubular adenomas -Advised repeat colonoscopy in 3 years  Seen in the hospital in September 2024.  He was admitted for AKI and hypotension and had complaints of odynophagia.  He was experiencing pain and burning with swallowing liquids medications and food.  He denied any frequent NSAID use or alcohol use but did have a history of this but nothing recent.  Never had an upper endoscopy.  Given he had active COVID-19 diagnosis at the time he was recommended to use Carafate  4 times daily for 2 weeks and continue on PPI and if having ongoing symptoms outpatient can plan for outpatient upper endoscopy.  CT A/P 10/22/22 IMPRESSION: -Subsegmental atelectasis or scarring within the lingula -Scattered gas fluid levels throughout the distal small bowel and proximal colon, compatible with ileus or enteritis. -No evidence of high-grade bowel obstruction. -No evidence of urinary tract calculi or obstructive uropathy. -Exophytic hypodensity in the lower  pole of left kidney, compatible with cyst  Echocardiogram September 2024 with EF 60-65% with evidence of grade 1 diastolic dysfunction and no evidence of mitral stenosis or aortic stenosis.  Today:  Discussed the use of AI scribe software for clinical note transcription with the patient, who gave verbal consent to proceed.  He has a history of colon polyps, with multiple polyps identified during a colonoscopy three years ago. There is no known family history of colon cancer or polyps, and he is unsure if any family members have had polyps. He denies abdominal pain, weight loss, lack of appetite, nausea, vomiting, early satiety, chest pain, shortness of breath.   He experienced a COVID-19 infection in September of the previous year, which was complicated by significant dysphagia and odynophagia. These symptoms were managed with medication (carafate  and pantoprazole ), which he no longer uses as he has not needed it since recovery. He still has a small amount of the medication at home but has not used it recently.  He is currently taking amlodipine , valsartan-hydrochlorothiazide, atorvastatin , and a chewable aspirin . His blood pressure is under control, and his cholesterol levels were normal at his last doctor's visit.  He experiences occasional constipation but generally has daily bowel movements. His stools vary from liquid-like to small balls. Dairy products, such as milk, cause gastrointestinal upset and diarrhea. He consumes ice cream sandwiches daily but has not had milk in a long time.  He has a history of smoking but smokes cigarettes very infrequently, with a pack lasting one to two months. He occasionally consumes  alcohol, specifically [place] beer, in small quantities. No chest pain, shortness of breath, dizziness, or lightheadedness.  He mentions a swollen and painful finger joint, which he has been treating with over-the-counter arthritis cream, though he is uncertain of its  effectiveness. He has not sought medical attention for this issue.  He is visually impaired, able to distinguish between day and night but unable to see shadows or details and relies on other senses to navigate his environment. Given this he is unsure about any melena or brbpr.       Wt Readings from Last 6 Encounters:  12/11/23 192 lb (87.1 kg)  10/23/22 199 lb 11.8 oz (90.6 kg)  04/16/22 214 lb (97.1 kg)  07/07/20 203 lb (92.1 kg)  05/25/20 214 lb (97.1 kg)  03/28/18 200 lb (90.7 kg)    Body mass index is 27.55 kg/m.   Current Outpatient Medications  Medication Sig Dispense Refill   amLODipine  (NORVASC ) 5 MG tablet Take 2 tablets (10 mg total) by mouth daily.     aspirin  81 MG chewable tablet Chew 81 mg by mouth daily.     atorvastatin  (LIPITOR) 10 MG tablet Take 10 mg by mouth daily.     sildenafil (VIAGRA) 100 MG tablet Take 100 mg by mouth daily.     valsartan-hydrochlorothiazide (DIOVAN-HCT) 160-25 MG tablet Take 1 tablet by mouth daily.     HYDROcodone -acetaminophen  (NORCO) 7.5-325 MG tablet Take 1 tablet by mouth every 6 (six) hours as needed for moderate pain. (Patient not taking: Reported on 12/11/2023) 56 tablet 0   No current facility-administered medications for this visit.    Past Medical History:  Diagnosis Date   Alcohol abuse    Blind    Hyperlipidemia    Hypertension     Past Surgical History:  Procedure Laterality Date   CATARACT EXTRACTION W/PHACO Right 09/26/2014   Procedure: CATARACT EXTRACTION PHACO AND INTRAOCULAR LENS PLACEMENT RIGHT EYE CDE=18.56;  Surgeon: Cherene Mania, MD;  Location: [facility];  Service: Ophthalmology;  Laterality: Right;   COLONOSCOPY WITH PROPOFOL  N/A 07/07/2020   Procedure: COLONOSCOPY WITH PROPOFOL ;  Surgeon: Cindie Carlin POUR, DO;  Location: [facility];  Service: Endoscopy;  Laterality: N/A;  10:00am   POLYPECTOMY  07/07/2020   Procedure: POLYPECTOMY;  Surgeon: Cindie Carlin POUR, DO;  Location: [facility];  Service:  Endoscopy;;   ROTATOR CUFF REPAIR Left 2012   TOOTH EXTRACTION N/A 1996    Family History  Problem Relation Age of Onset   Cancer Sister 64       breast ca   Hypertension Sister    Hypertension Sister    Hypertension Sister    Hypertension Sister    Hypertension Brother    Hypertension Brother    Hypertension Brother    Colon cancer Neg Hx     Allergies as of 12/11/2023 - Review Complete 12/11/2023  Allergen Reaction Noted   Cortisone  08/05/2012    Social History   Socioeconomic History   Marital status: Single    Spouse name: Not on file   Number of children: Not on file   Years of education: Not on file   Highest education level: Not on file  Occupational History   Not on file  Tobacco Use   Smoking status: Every Day    Current packs/day: 0.50    Average packs/day: 0.5 packs/day for 20.0 years (10.0 ttl pk-yrs)    Types: Cigarettes   Smokeless tobacco: Never  Vaping Use   Vaping  status: Never Used  Substance and Sexual Activity   Alcohol use: Not Currently    Alcohol/week: 1.0 standard drink of alcohol    Types: 1 Glasses of wine per week    Comment: used to drink heavily. Last drank years ago.    Drug use: No   Sexual activity: Not on file  Other Topics Concern   Not on file  Social History Narrative   Not on file   Social Drivers of Health   Financial Resource Strain: Not on file  Food Insecurity: Patient Declined (10/23/2022)   Hunger Vital Sign    Worried About Running Out of Food in the Last Year: Patient declined    Ran Out of Food in the Last Year: Patient declined  Transportation Needs: No Transportation Needs (10/23/2022)   PRAPARE - Administrator, Civil Service (Medical): No    Lack of Transportation (Non-Medical): No  Physical Activity: Not on file  Stress: Stress Concern Present (10/23/2022)   [employer] of Occupational Health - Occupational Stress Questionnaire    Feeling of Stress : To some extent  Social  Connections: Not on file    Review of Systems   Gen: Denies fever, chills, anorexia. Denies fatigue, weakness, weight loss.  CV: Denies chest pain, palpitations, syncope, peripheral edema, and claudication. Resp: Denies dyspnea at rest, cough, wheezing, coughing up blood, and pleurisy. GI: See HPI Derm: Denies rash, itching, dry skin Psych: Denies depression, anxiety, memory loss, confusion. No homicidal or suicidal ideation.  Neuro: + blindness Heme: Denies bruising, bleeding, and enlarged lymph nodes. + pain of left thumb  Physical Exam   BP 135/74 (BP Location: Right Arm, Patient Position: Sitting, Cuff Size: Large)   Pulse 71   Temp 97.9 F (36.6 C) (Temporal)   Ht 5' 10 (1.778 m)   Wt 192 lb (87.1 kg)   BMI 27.55 kg/m   General:   Alert and oriented. No distress noted. Pleasant and cooperative.  Head:  Normocephalic and atraumatic. Eyes:  Conjuctiva clear without scleral icterus. Blind Mouth:  Oral mucosa pink and moist. Poor dentition. No lesions. Lungs:  Clear to auscultation bilaterally. No wheezes, rales, or rhonchi. No distress.  Heart:  S1, S2 present without murmurs appreciated.  Abdomen:  +BS, soft, non-tender and non-distended. No rebound or guarding. No HSM or masses noted. Rectal: deferred Msk:  Symmetrical without gross deformities. Normal posture. Extremities:  Without edema. Neurologic:  Alert and  oriented x4 Psych:  Alert and cooperative. Normal mood and affect.  Assessment & Plan  [name] is a 68 y.o. male presenting today to schedule surveillance colonoscopy.     Personal history of colonic polyps Colonoscopy in 2022 with multiple tubular adenomas and was recommended 3-year repeat. No family history of colon cancer or polyps. Currently without alarm symptoms but does have some occasional constipation.   Proceed with colonoscopy with propofol  by Dr. Cindie  in near future: the risks, benefits, and alternatives have been discussed with the  patient in detail. The patient states understanding and desires to proceed. ASA 2  Miralax 1 capful daily for 3 days prior   Constipation Intermittent constipation with occasional hard stools. No significant straining or prolonged time on the toilet. Stools are mostly liquid possible secondary to diet, with occasional hard stools. Possible dietary influence, including lactose intolerance. - Will need to give some MiraLAX prior to colonoscopy to assist in prep. Discussed stool softener vs MiraLAX for constipation management.   Lactose intolerance Symptoms  of gas and diarrhea after consuming dairy products, including milk and ice cream. Symptoms suggestive of lactose intolerance. - Consider using lactose-free dairy products or lactase enzyme supplements.  Swollen finger joint Swelling and pain in the joint of the left thumb, particularly on the lateral aspect. This is asymmetric compared to right thumb. No warmth but is swollen and mildly tender to touch. - Apply arthritis cream multiple times a day and monitor for worsening symptoms - If symptoms worsen or joint becomes warm, contact orthopedics or primary care for further evaluation      Follow up   Follow up as needed.   Charmaine Melia, MSN, FNP-BC, AGACNP-BC [facility]

## 2023-12-11 NOTE — Telephone Encounter (Signed)
 Called to schedule pt and he states he is in a store. He will call back once he gets home.  TCS w/Dr.Carver, asa 2

## 2023-12-11 NOTE — Progress Notes (Signed)
 GI Office Note    Referring Provider: Leigh Lung, MD Primary Care Physician:  Leigh Lung, MD Primary Gastroenterologist: Carlin POUR. Cindie, DO  Date:  12/11/2023  ID:  [name], DOB 1955/11/13, MRN [id number]   Chief Complaint   Chief Complaint  Patient presents with   Colonoscopy    Colonoscopy screening    History of Present Illness  [name] is a 68 y.o. male with a history of HLD, HTN, prior alcohol abuse, and is legally blind presenting today to discuss scheduling his surveillance colonoscopy.  Last office visit in 2022 with Josette Centers, PA-C.  He denied any alarm symptoms or any symptoms altogether from upper or lower GI stance.  He reported he will used to drink alcohol heavily on the weekends but had not had any large amounts of frequent use recently.  He is scheduled for colonoscopy  Colonoscopy June 2022: -6 mm polyp in the cecum -2 mm polyp in the cecum -3 polyps in the transverse colon -Pathology consistent with tubular adenomas -Advised repeat colonoscopy in 3 years  Seen in the hospital in September 2024.  He was admitted for AKI and hypotension and had complaints of odynophagia.  He was experiencing pain and burning with swallowing liquids medications and food.  He denied any frequent NSAID use or alcohol use but did have a history of this but nothing recent.  Never had an upper endoscopy.  Given he had active COVID-19 diagnosis at the time he was recommended to use Carafate  4 times daily for 2 weeks and continue on PPI and if having ongoing symptoms outpatient can plan for outpatient upper endoscopy.  CT A/P 10/22/22 IMPRESSION: -Subsegmental atelectasis or scarring within the lingula -Scattered gas fluid levels throughout the distal small bowel and proximal colon, compatible with ileus or enteritis. -No evidence of high-grade bowel obstruction. -No evidence of urinary tract calculi or obstructive uropathy. -Exophytic hypodensity in the lower  pole of left kidney, compatible with cyst  Echocardiogram September 2024 with EF 60-65% with evidence of grade 1 diastolic dysfunction and no evidence of mitral stenosis or aortic stenosis.  Today:  Discussed the use of AI scribe software for clinical note transcription with the patient, who gave verbal consent to proceed.  He has a history of colon polyps, with multiple polyps identified during a colonoscopy three years ago. There is no known family history of colon cancer or polyps, and he is unsure if any family members have had polyps. He denies abdominal pain, weight loss, lack of appetite, nausea, vomiting, early satiety, chest pain, shortness of breath.   He experienced a COVID-19 infection in September of the previous year, which was complicated by significant dysphagia and odynophagia. These symptoms were managed with medication (carafate  and pantoprazole ), which he no longer uses as he has not needed it since recovery. He still has a small amount of the medication at home but has not used it recently.  He is currently taking amlodipine , valsartan-hydrochlorothiazide, atorvastatin , and a chewable aspirin . His blood pressure is under control, and his cholesterol levels were normal at his last doctor's visit.  He experiences occasional constipation but generally has daily bowel movements. His stools vary from liquid-like to small balls. Dairy products, such as milk, cause gastrointestinal upset and diarrhea. He consumes ice cream sandwiches daily but has not had milk in a long time.  He has a history of smoking but smokes cigarettes very infrequently, with a pack lasting one to two months. He occasionally consumes  alcohol, specifically [place] beer, in small quantities. No chest pain, shortness of breath, dizziness, or lightheadedness.  He mentions a swollen and painful finger joint, which he has been treating with over-the-counter arthritis cream, though he is uncertain of its  effectiveness. He has not sought medical attention for this issue.  He is visually impaired, able to distinguish between day and night but unable to see shadows or details and relies on other senses to navigate his environment. Given this he is unsure about any melena or brbpr.       Wt Readings from Last 6 Encounters:  12/11/23 192 lb (87.1 kg)  10/23/22 199 lb 11.8 oz (90.6 kg)  04/16/22 214 lb (97.1 kg)  07/07/20 203 lb (92.1 kg)  05/25/20 214 lb (97.1 kg)  03/28/18 200 lb (90.7 kg)    Body mass index is 27.55 kg/m.   Current Outpatient Medications  Medication Sig Dispense Refill   amLODipine  (NORVASC ) 5 MG tablet Take 2 tablets (10 mg total) by mouth daily.     aspirin  81 MG chewable tablet Chew 81 mg by mouth daily.     atorvastatin  (LIPITOR) 10 MG tablet Take 10 mg by mouth daily.     sildenafil (VIAGRA) 100 MG tablet Take 100 mg by mouth daily.     valsartan-hydrochlorothiazide (DIOVAN-HCT) 160-25 MG tablet Take 1 tablet by mouth daily.     HYDROcodone -acetaminophen  (NORCO) 7.5-325 MG tablet Take 1 tablet by mouth every 6 (six) hours as needed for moderate pain. (Patient not taking: Reported on 12/11/2023) 56 tablet 0   No current facility-administered medications for this visit.    Past Medical History:  Diagnosis Date   Alcohol abuse    Blind    Hyperlipidemia    Hypertension     Past Surgical History:  Procedure Laterality Date   CATARACT EXTRACTION W/PHACO Right 09/26/2014   Procedure: CATARACT EXTRACTION PHACO AND INTRAOCULAR LENS PLACEMENT RIGHT EYE CDE=18.56;  Surgeon: Cherene Mania, MD;  Location: [facility];  Service: Ophthalmology;  Laterality: Right;   COLONOSCOPY WITH PROPOFOL  N/A 07/07/2020   Procedure: COLONOSCOPY WITH PROPOFOL ;  Surgeon: Cindie Carlin POUR, DO;  Location: [facility];  Service: Endoscopy;  Laterality: N/A;  10:00am   POLYPECTOMY  07/07/2020   Procedure: POLYPECTOMY;  Surgeon: Cindie Carlin POUR, DO;  Location: [facility];  Service:  Endoscopy;;   ROTATOR CUFF REPAIR Left 2012   TOOTH EXTRACTION N/A 1996    Family History  Problem Relation Age of Onset   Cancer Sister 64       breast ca   Hypertension Sister    Hypertension Sister    Hypertension Sister    Hypertension Sister    Hypertension Brother    Hypertension Brother    Hypertension Brother    Colon cancer Neg Hx     Allergies as of 12/11/2023 - Review Complete 12/11/2023  Allergen Reaction Noted   Cortisone  08/05/2012    Social History   Socioeconomic History   Marital status: Single    Spouse name: Not on file   Number of children: Not on file   Years of education: Not on file   Highest education level: Not on file  Occupational History   Not on file  Tobacco Use   Smoking status: Every Day    Current packs/day: 0.50    Average packs/day: 0.5 packs/day for 20.0 years (10.0 ttl pk-yrs)    Types: Cigarettes   Smokeless tobacco: Never  Vaping Use   Vaping  status: Never Used  Substance and Sexual Activity   Alcohol use: Not Currently    Alcohol/week: 1.0 standard drink of alcohol    Types: 1 Glasses of wine per week    Comment: used to drink heavily. Last drank years ago.    Drug use: No   Sexual activity: Not on file  Other Topics Concern   Not on file  Social History Narrative   Not on file   Social Drivers of Health   Financial Resource Strain: Not on file  Food Insecurity: Patient Declined (10/23/2022)   Hunger Vital Sign    Worried About Running Out of Food in the Last Year: Patient declined    Ran Out of Food in the Last Year: Patient declined  Transportation Needs: No Transportation Needs (10/23/2022)   PRAPARE - Administrator, Civil Service (Medical): No    Lack of Transportation (Non-Medical): No  Physical Activity: Not on file  Stress: Stress Concern Present (10/23/2022)   [employer] of Occupational Health - Occupational Stress Questionnaire    Feeling of Stress : To some extent  Social  Connections: Not on file    Review of Systems   Gen: Denies fever, chills, anorexia. Denies fatigue, weakness, weight loss.  CV: Denies chest pain, palpitations, syncope, peripheral edema, and claudication. Resp: Denies dyspnea at rest, cough, wheezing, coughing up blood, and pleurisy. GI: See HPI Derm: Denies rash, itching, dry skin Psych: Denies depression, anxiety, memory loss, confusion. No homicidal or suicidal ideation.  Neuro: + blindness Heme: Denies bruising, bleeding, and enlarged lymph nodes. + pain of left thumb  Physical Exam   BP 135/74 (BP Location: Right Arm, Patient Position: Sitting, Cuff Size: Large)   Pulse 71   Temp 97.9 F (36.6 C) (Temporal)   Ht 5' 10 (1.778 m)   Wt 192 lb (87.1 kg)   BMI 27.55 kg/m   General:   Alert and oriented. No distress noted. Pleasant and cooperative.  Head:  Normocephalic and atraumatic. Eyes:  Conjuctiva clear without scleral icterus. Blind Mouth:  Oral mucosa pink and moist. Poor dentition. No lesions. Lungs:  Clear to auscultation bilaterally. No wheezes, rales, or rhonchi. No distress.  Heart:  S1, S2 present without murmurs appreciated.  Abdomen:  +BS, soft, non-tender and non-distended. No rebound or guarding. No HSM or masses noted. Rectal: deferred Msk:  Symmetrical without gross deformities. Normal posture. Extremities:  Without edema. Neurologic:  Alert and  oriented x4 Psych:  Alert and cooperative. Normal mood and affect.  Assessment & Plan  [name] is a 68 y.o. male presenting today to schedule surveillance colonoscopy.     Personal history of colonic polyps Colonoscopy in 2022 with multiple tubular adenomas and was recommended 3-year repeat. No family history of colon cancer or polyps. Currently without alarm symptoms but does have some occasional constipation.   Proceed with colonoscopy with propofol  by Dr. Cindie  in near future: the risks, benefits, and alternatives have been discussed with the  patient in detail. The patient states understanding and desires to proceed. ASA 2  Miralax 1 capful daily for 3 days prior   Constipation Intermittent constipation with occasional hard stools. No significant straining or prolonged time on the toilet. Stools are mostly liquid possible secondary to diet, with occasional hard stools. Possible dietary influence, including lactose intolerance. - Will need to give some MiraLAX prior to colonoscopy to assist in prep. Discussed stool softener vs MiraLAX for constipation management.   Lactose intolerance Symptoms  of gas and diarrhea after consuming dairy products, including milk and ice cream. Symptoms suggestive of lactose intolerance. - Consider using lactose-free dairy products or lactase enzyme supplements.  Swollen finger joint Swelling and pain in the joint of the left thumb, particularly on the lateral aspect. This is asymmetric compared to right thumb. No warmth but is swollen and mildly tender to touch. - Apply arthritis cream multiple times a day and monitor for worsening symptoms - If symptoms worsen or joint becomes warm, contact orthopedics or primary care for further evaluation      Follow up   Follow up as needed.   Charmaine Melia, MSN, FNP-BC, AGACNP-BC [facility]

## 2023-12-11 NOTE — Telephone Encounter (Signed)
 Pt has been scheduled for 12/30/23. Pt to come by office to pick up instructions and prep sent to pharmacy.

## 2023-12-11 NOTE — Patient Instructions (Addendum)
 We are getting you scheduled for colonoscopy in the near future with Dr. Cindie.   If you are noticing any frequent constipation then I want you to start taking a stool softener once a day or using MiraLAX 1 capful in 8 ounces of water if needed.  If you have worsening pain in your left thumb, it becomes more swollen, or feels very hot to touch then I want you to follow back up with your primary care or go to see orthopedics to have this looked at further.  We will see you if needed if you develop any GI concerns.  Follow-up colonoscopy will be determined after your procedure and obtaining any pathology from samples taken during the colonoscopy.  It was a pleasure to see you today. I want to create trusting relationships with patients. If you receive a survey regarding your visit,  I greatly appreciate you taking time to fill this out on paper or through your MyChart. I value your feedback.  Charmaine Melia, MSN, FNP-BC, AGACNP-BC [facility]

## 2023-12-30 ENCOUNTER — Ambulatory Visit (HOSPITAL_COMMUNITY)
Admission: RE | Admit: 2023-12-30 | Discharge: 2023-12-30 | Disposition: A | Discharge status: Home / Self Care | Attending: Internal Medicine | Admitting: Internal Medicine

## 2023-12-30 ENCOUNTER — Encounter (HOSPITAL_COMMUNITY)
Admission: RE | Disposition: A | Discharge status: Home / Self Care | Payer: Self-pay | Source: Home / Self Care | Attending: Internal Medicine

## 2023-12-30 ENCOUNTER — Other Ambulatory Visit: Payer: Self-pay

## 2023-12-30 ENCOUNTER — Ambulatory Visit (HOSPITAL_COMMUNITY): Admitting: Anesthesiology

## 2023-12-30 ENCOUNTER — Encounter (HOSPITAL_COMMUNITY): Payer: Self-pay | Admitting: Internal Medicine

## 2023-12-30 DIAGNOSIS — F17210 Nicotine dependence, cigarettes, uncomplicated: Secondary | ICD-10-CM | POA: Diagnosis not present

## 2023-12-30 DIAGNOSIS — E739 Lactose intolerance, unspecified: Secondary | ICD-10-CM | POA: Diagnosis not present

## 2023-12-30 DIAGNOSIS — D649 Anemia, unspecified: Secondary | ICD-10-CM | POA: Diagnosis not present

## 2023-12-30 DIAGNOSIS — Z860101 Personal history of adenomatous and serrated colon polyps: Secondary | ICD-10-CM | POA: Diagnosis present

## 2023-12-30 DIAGNOSIS — K635 Polyp of colon: Secondary | ICD-10-CM | POA: Diagnosis not present

## 2023-12-30 DIAGNOSIS — K5900 Constipation, unspecified: Secondary | ICD-10-CM | POA: Insufficient documentation

## 2023-12-30 DIAGNOSIS — I10 Essential (primary) hypertension: Secondary | ICD-10-CM | POA: Insufficient documentation

## 2023-12-30 DIAGNOSIS — D123 Benign neoplasm of transverse colon: Secondary | ICD-10-CM

## 2023-12-30 DIAGNOSIS — E785 Hyperlipidemia, unspecified: Secondary | ICD-10-CM | POA: Insufficient documentation

## 2023-12-30 DIAGNOSIS — Z8249 Family history of ischemic heart disease and other diseases of the circulatory system: Secondary | ICD-10-CM | POA: Diagnosis not present

## 2023-12-30 DIAGNOSIS — Z1211 Encounter for screening for malignant neoplasm of colon: Secondary | ICD-10-CM | POA: Insufficient documentation

## 2023-12-30 DIAGNOSIS — K5140 Inflammatory polyps of colon without complications: Secondary | ICD-10-CM | POA: Insufficient documentation

## 2023-12-30 DIAGNOSIS — D120 Benign neoplasm of cecum: Secondary | ICD-10-CM

## 2023-12-30 DIAGNOSIS — Z79899 Other long term (current) drug therapy: Secondary | ICD-10-CM | POA: Insufficient documentation

## 2023-12-30 SURGERY — COLONOSCOPY
Anesthesia: General

## 2023-12-30 MED ORDER — LACTATED RINGERS IV SOLN: INTRAVENOUS | Status: DC

## 2023-12-30 MED ORDER — PROPOFOL 500 MG/50ML IV EMUL
INTRAVENOUS | Status: DC | PRN
  Administered 2023-12-30: 30 mg via INTRAVENOUS
  Administered 2023-12-30: 200 ug/kg/min via INTRAVENOUS
  Administered 2023-12-30: 100 mg via INTRAVENOUS

## 2023-12-30 NOTE — Discharge Instructions (Signed)
  Colonoscopy Discharge Instructions  Read the instructions outlined below and refer to this sheet in the next few weeks. These discharge instructions provide you with general information on caring for yourself after you leave the hospital. Your doctor may also give you specific instructions. While your treatment has been planned according to the most current medical practices available, unavoidable complications occasionally occur.   ACTIVITY You may resume your regular activity, but move at a slower pace for the next 24 hours.  Take frequent rest periods for the next 24 hours.  Walking will help get rid of the air and reduce the bloated feeling in your belly (abdomen).  No driving for 24 hours (because of the medicine (anesthesia) used during the test).   Do not sign any important legal documents or operate any machinery for 24 hours (because of the anesthesia used during the test).  NUTRITION Drink plenty of fluids.  You may resume your normal diet as instructed by your doctor.  Begin with a light meal and progress to your normal diet. Heavy or fried foods are harder to digest and may make you feel sick to your stomach (nauseated).  Avoid alcoholic beverages for 24 hours or as instructed.  MEDICATIONS You may resume your normal medications unless your doctor tells you otherwise.  WHAT YOU CAN EXPECT TODAY Some feelings of bloating in the abdomen.  Passage of more gas than usual.  Spotting of blood in your stool or on the toilet paper.  IF YOU HAD POLYPS REMOVED DURING THE COLONOSCOPY: No aspirin  products for 7 days or as instructed.  No alcohol for 7 days or as instructed.  Eat a soft diet for the next 24 hours.  FINDING OUT THE RESULTS OF YOUR TEST Not all test results are available during your visit. If your test results are not back during the visit, make an appointment with your caregiver to find out the results. Do not assume everything is normal if you have not heard from your  caregiver or the medical facility. It is important for you to follow up on all of your test results.  SEEK IMMEDIATE MEDICAL ATTENTION IF: You have more than a spotting of blood in your stool.  Your belly is swollen (abdominal distention).  You are nauseated or vomiting.  You have a temperature over 101.  You have abdominal pain or discomfort that is severe or gets worse throughout the day.   Your colonoscopy revealed 3 polyp(s) which I removed successfully. Await pathology results, my office will contact you. I recommend repeating colonoscopy in 5 years for surveillance purposes, depending on pathology results.  Otherwise follow up with GI as needed.    I hope you have a great rest of your week!  Carlin POUR. Cindie, D.O. Gastroenterology and Hepatology [facility]

## 2023-12-30 NOTE — Anesthesia Preprocedure Evaluation (Signed)
 Anesthesia Evaluation  Patient identified by MRN, date of birth, ID band  Reviewed: Allergy & Precautions, NPO status , Patient's Chart, lab work & pertinent test results  Airway Mallampati: I  TM Distance: >3 FB Neck ROM: Full    Dental   Pulmonary Current Smoker   Pulmonary exam normal        Cardiovascular hypertension, Pt. on medications Normal cardiovascular exam     Neuro/Psych    GI/Hepatic   Endo/Other    Renal/GU Renal disease     Musculoskeletal   Abdominal   Peds  Hematology  (+) Blood dyscrasia, anemia   Anesthesia Other Findings   Reproductive/Obstetrics                              Anesthesia Physical Anesthesia Plan  ASA: 2  Anesthesia Plan: General   Post-op Pain Management:    Induction: Intravenous  PONV Risk Score and Plan: Propofol  infusion  Airway Management Planned: Natural Airway and Nasal Cannula  Additional Equipment:   Intra-op Plan:   Post-operative Plan:   Informed Consent: I have reviewed the patients History and Physical, chart, labs and discussed the procedure including the risks, benefits and alternatives for the proposed anesthesia with the patient or authorized representative who has indicated his/her understanding and acceptance.     Dental advisory given  Plan Discussed with: Anesthesiologist  Anesthesia Plan Comments:         Anesthesia Quick Evaluation

## 2023-12-30 NOTE — Interval H&P Note (Signed)
 History and Physical Interval Note:  12/30/2023 11:33 AM  [name]  has presented today for surgery, with the diagnosis of yk:enobed.  The various methods of treatment have been discussed with the patient and family. After consideration of risks, benefits and other options for treatment, the patient has consented to  Procedure(s) with comments: COLONOSCOPY (N/A) - 12:45 pm, asa 2 as a surgical intervention.  The patient's history has been reviewed, patient examined, no change in status, stable for surgery.  I have reviewed the patient's chart and labs.  Questions were answered to the patient's satisfaction.     [name]

## 2023-12-30 NOTE — Op Note (Signed)
 [facility] Patient Name: [name] Procedure Date: 12/30/2023 11:29 AM MRN: [id number] Date of Birth: Apr 26, 1955 Attending MD: Carlin POUR. Cindie , [place], [id number] CSN: [id number] Age: 68 Admit Type: Outpatient Procedure:                Colonoscopy Indications:              High risk colon cancer surveillance: Personal                            history of colonic polyps Providers:                Carlin POUR. Cindie, DO, Harlene Lips, Daphne Mulch Technician, Technician Referring MD:              Medicines:                See the Anesthesia note for documentation of the                            administered medications Complications:            No immediate complications. Estimated Blood Loss:     Estimated blood loss was minimal. Procedure:                Pre-Anesthesia Assessment:                           - The anesthesia plan was to use monitored                            anesthesia care (MAC).                           After obtaining informed consent, the colonoscope                            was passed under direct vision. Throughout the                            procedure, the patient's blood pressure, pulse, and                            oxygen  saturations were monitored continuously. The                            [id number]-HQ190L ([id number]) Peds Colon was introduced                            through the anus and advanced to the the cecum,                            identified by appendiceal orifice and ileocecal                            valve. The colonoscopy was performed without  difficulty. The patient tolerated the procedure                            well. The quality of the bowel preparation was                            evaluated using the BBPS [facility] Bowel Preparation                            Scale) with scores of: Right Colon = 3, Transverse                            Colon = 3 and Left Colon  = 3 (entire mucosa seen                            well with no residual staining, small fragments of                            stool or opaque liquid). The total BBPS score                            equals 9. Scope In: 11:42:08 AM Scope Out: 11:56:13 AM Scope Withdrawal Time: 0 hours 12 minutes 0 seconds  Total Procedure Duration: 0 hours 14 minutes 5 seconds  Findings:      A 5 mm polyp was found in the cecum. The polyp was sessile. The polyp       was removed with a cold snare. Resection and retrieval were complete.      Two sessile polyps were found in the transverse colon. The polyps were 5       to 8 mm in size. These polyps were removed with a cold snare. Resection       and retrieval were complete.      The exam was otherwise without abnormality. Impression:               - One 5 mm polyp in the cecum, removed with a cold                            snare. Resected and retrieved.                           - Two 5 to 8 mm polyps in the transverse colon,                            removed with a cold snare. Resected and retrieved.                           - The examination was otherwise normal. Moderate Sedation:      Per Anesthesia Care Recommendation:           - Patient has a contact number available for                            emergencies. The signs and symptoms of potential  delayed complications were discussed with the                            patient. Return to normal activities tomorrow.                            Written discharge instructions were provided to the                            patient.                           - Patient has a contact number available for                            emergencies. The signs and symptoms of potential                            delayed complications were discussed with the                            patient. Return to normal activities tomorrow.                            Written discharge  instructions were provided to the                            patient.                           - Resume previous diet.                           - Continue present medications.                           - Await pathology results.                           - Repeat colonoscopy in 5 years for surveillance.                           - Return to GI clinic PRN. Procedure Code(s):        --- Professional ---                           [phone], Colonoscopy, flexible; with removal of                            tumor(s), polyp(s), or other lesion(s) by snare                            technique Diagnosis Code(s):        --- Professional ---                           Z86.010, Personal  history of colonic polyps                           D12.0, Benign neoplasm of cecum                           D12.3, Benign neoplasm of transverse colon (hepatic                            flexure or splenic flexure) CPT copyright 2022 American Medical Association. All rights reserved. The codes documented in this report are preliminary and upon coder review may  be revised to meet current compliance requirements. Carlin POUR. Cindie, DO Carlin POUR. Cindie, DO 12/30/2023 12:12:53 PM This report has been signed electronically. Number of Addenda: 0

## 2023-12-30 NOTE — Transfer of Care (Signed)
 Immediate Anesthesia Transfer of Care Note  Patient: [name]  Procedure(s) Performed: COLONOSCOPY  Patient Location: Endoscopy Unit  Anesthesia Type:General  Level of Consciousness: awake  Airway & Oxygen  Therapy: Patient Spontanous Breathing  Post-op Assessment: Report given to RN and Post -op Vital signs reviewed and stable  Post vital signs: Reviewed and stable  Last Vitals:  Vitals Value Taken Time  BP 104/68 12/30/23 12:00  Temp    Pulse 53 12/30/23 12:00  Resp 17 12/30/23 12:00  SpO2 97 % 12/30/23 12:00    Last Pain:  Vitals:   12/30/23 1136  TempSrc:   PainSc: 0-No pain      Patients Stated Pain Goal: 5 (12/30/23 1119)  Complications: No notable events documented.

## 2023-12-31 LAB — SURGICAL PATHOLOGY

## 2024-01-02 NOTE — Anesthesia Postprocedure Evaluation (Signed)
 Anesthesia Post Note  Patient: [name]  Procedure(s) Performed: COLONOSCOPY  Patient location during evaluation: Phase II Anesthesia Type: General Level of consciousness: awake Pain management: pain level controlled Vital Signs Assessment: post-procedure vital signs reviewed and stable Respiratory status: spontaneous breathing and respiratory function stable Cardiovascular status: blood pressure returned to baseline and stable Postop Assessment: no headache and no apparent nausea or vomiting Anesthetic complications: no Comments: Late entry   No notable events documented.   Last Vitals:  Vitals:   12/30/23 1119 12/30/23 1200  BP: 135/75 104/68  Pulse:  (!) 53  Resp: 16 17  Temp: 36.5 C 36.4 C  SpO2: 98% 97%    Last Pain:  Vitals:   12/30/23 1203  TempSrc:   PainSc: 0-No pain                 Yvonna JINNY Bosworth

## 2024-01-05 ENCOUNTER — Encounter (HOSPITAL_COMMUNITY): Payer: Self-pay | Admitting: Internal Medicine
